# Patient Record
Sex: Female | Born: 1950 | Race: Black or African American | Hispanic: No | Smoking: Never smoker
Health system: Southern US, Community
[De-identification: ages and names within clinical notes are randomized; demographics above are authoritative.]

## PROBLEM LIST (undated history)

## (undated) ENCOUNTER — Ambulatory Visit (HOSPITAL_COMMUNITY): Admission: EM | Source: Home / Self Care

## (undated) DIAGNOSIS — M47812 Spondylosis without myelopathy or radiculopathy, cervical region: Secondary | ICD-10-CM

## (undated) DIAGNOSIS — Z8709 Personal history of other diseases of the respiratory system: Secondary | ICD-10-CM

## (undated) DIAGNOSIS — E785 Hyperlipidemia, unspecified: Secondary | ICD-10-CM

## (undated) DIAGNOSIS — F329 Major depressive disorder, single episode, unspecified: Secondary | ICD-10-CM

## (undated) DIAGNOSIS — J40 Bronchitis, not specified as acute or chronic: Secondary | ICD-10-CM

## (undated) DIAGNOSIS — Z87442 Personal history of urinary calculi: Secondary | ICD-10-CM

## (undated) DIAGNOSIS — K219 Gastro-esophageal reflux disease without esophagitis: Secondary | ICD-10-CM

## (undated) DIAGNOSIS — E7800 Pure hypercholesterolemia, unspecified: Secondary | ICD-10-CM

## (undated) DIAGNOSIS — Z8249 Family history of ischemic heart disease and other diseases of the circulatory system: Secondary | ICD-10-CM

## (undated) DIAGNOSIS — I4719 Other supraventricular tachycardia: Secondary | ICD-10-CM

## (undated) DIAGNOSIS — I209 Angina pectoris, unspecified: Secondary | ICD-10-CM

## (undated) DIAGNOSIS — I499 Cardiac arrhythmia, unspecified: Secondary | ICD-10-CM

## (undated) DIAGNOSIS — I639 Cerebral infarction, unspecified: Secondary | ICD-10-CM

## (undated) DIAGNOSIS — E119 Type 2 diabetes mellitus without complications: Secondary | ICD-10-CM

## (undated) DIAGNOSIS — J189 Pneumonia, unspecified organism: Secondary | ICD-10-CM

## (undated) DIAGNOSIS — R519 Headache, unspecified: Secondary | ICD-10-CM

## (undated) DIAGNOSIS — I471 Supraventricular tachycardia: Secondary | ICD-10-CM

## (undated) DIAGNOSIS — J45909 Unspecified asthma, uncomplicated: Secondary | ICD-10-CM

## (undated) DIAGNOSIS — G5603 Carpal tunnel syndrome, bilateral upper limbs: Secondary | ICD-10-CM

## (undated) DIAGNOSIS — I10 Essential (primary) hypertension: Secondary | ICD-10-CM

## (undated) DIAGNOSIS — R413 Other amnesia: Secondary | ICD-10-CM

## (undated) DIAGNOSIS — N6022 Fibroadenosis of left breast: Secondary | ICD-10-CM

## (undated) DIAGNOSIS — F3289 Other specified depressive episodes: Secondary | ICD-10-CM

## (undated) DIAGNOSIS — E559 Vitamin D deficiency, unspecified: Secondary | ICD-10-CM

## (undated) DIAGNOSIS — F419 Anxiety disorder, unspecified: Secondary | ICD-10-CM

## (undated) DIAGNOSIS — D573 Sickle-cell trait: Secondary | ICD-10-CM

## (undated) DIAGNOSIS — H269 Unspecified cataract: Secondary | ICD-10-CM

## (undated) HISTORY — DX: Other specified depressive episodes: F32.89

## (undated) HISTORY — DX: Pure hypercholesterolemia, unspecified: E78.00

## (undated) HISTORY — DX: Spondylosis without myelopathy or radiculopathy, cervical region: M47.812

## (undated) HISTORY — DX: Major depressive disorder, single episode, unspecified: F32.9

## (undated) HISTORY — PX: DILATION AND CURETTAGE OF UTERUS: SHX78

## (undated) HISTORY — DX: Vitamin D deficiency, unspecified: E55.9

## (undated) HISTORY — DX: Other amnesia: R41.3

## (undated) HISTORY — DX: Type 2 diabetes mellitus without complications: E11.9

---

## 1980-12-24 HISTORY — PX: TUBAL LIGATION: SHX77

## 1989-12-24 HISTORY — PX: ENDOMETRIAL ABLATION: SHX621

## 1999-11-23 ENCOUNTER — Other Ambulatory Visit: Admission: RE | Admit: 1999-11-23 | Discharge: 1999-11-23 | Payer: Self-pay | Admitting: Gynecology

## 2000-11-29 ENCOUNTER — Other Ambulatory Visit: Admission: RE | Admit: 2000-11-29 | Discharge: 2000-11-29 | Payer: Self-pay | Admitting: Gynecology

## 2001-01-24 ENCOUNTER — Ambulatory Visit (HOSPITAL_COMMUNITY): Admission: RE | Admit: 2001-01-24 | Discharge: 2001-01-24 | Payer: Self-pay | Admitting: Gynecology

## 2002-02-10 ENCOUNTER — Other Ambulatory Visit: Admission: RE | Admit: 2002-02-10 | Discharge: 2002-02-10 | Payer: Self-pay | Admitting: Gynecology

## 2002-02-25 ENCOUNTER — Encounter: Payer: Self-pay | Admitting: Emergency Medicine

## 2002-02-25 ENCOUNTER — Emergency Department (HOSPITAL_COMMUNITY): Admission: EM | Admit: 2002-02-25 | Discharge: 2002-02-25 | Payer: Self-pay | Admitting: Emergency Medicine

## 2003-04-05 ENCOUNTER — Other Ambulatory Visit: Admission: RE | Admit: 2003-04-05 | Discharge: 2003-04-05 | Payer: Self-pay | Admitting: Gynecology

## 2003-05-25 ENCOUNTER — Encounter: Admission: RE | Admit: 2003-05-25 | Discharge: 2003-08-23 | Payer: Self-pay | Admitting: Family Medicine

## 2003-08-31 ENCOUNTER — Encounter: Admission: RE | Admit: 2003-08-31 | Discharge: 2003-11-29 | Payer: Self-pay | Admitting: Internal Medicine

## 2003-12-30 ENCOUNTER — Encounter: Admission: RE | Admit: 2003-12-30 | Discharge: 2004-03-29 | Payer: Self-pay | Admitting: Internal Medicine

## 2004-03-18 ENCOUNTER — Emergency Department (HOSPITAL_COMMUNITY): Admission: EM | Admit: 2004-03-18 | Discharge: 2004-03-18 | Payer: Self-pay | Admitting: *Deleted

## 2004-03-18 IMAGING — CR DG CHEST 2V
2 series · 2 of 2 positions shown · non-contrast
Comparison: none

CLINICAL DATA: Chest pain.
 TWO-VIEW CHEST RADIOGRAPH, [DATE]
 Comparing to a report from a prior chest radiograph on [DATE].

[view not recorded (1 of 2)]
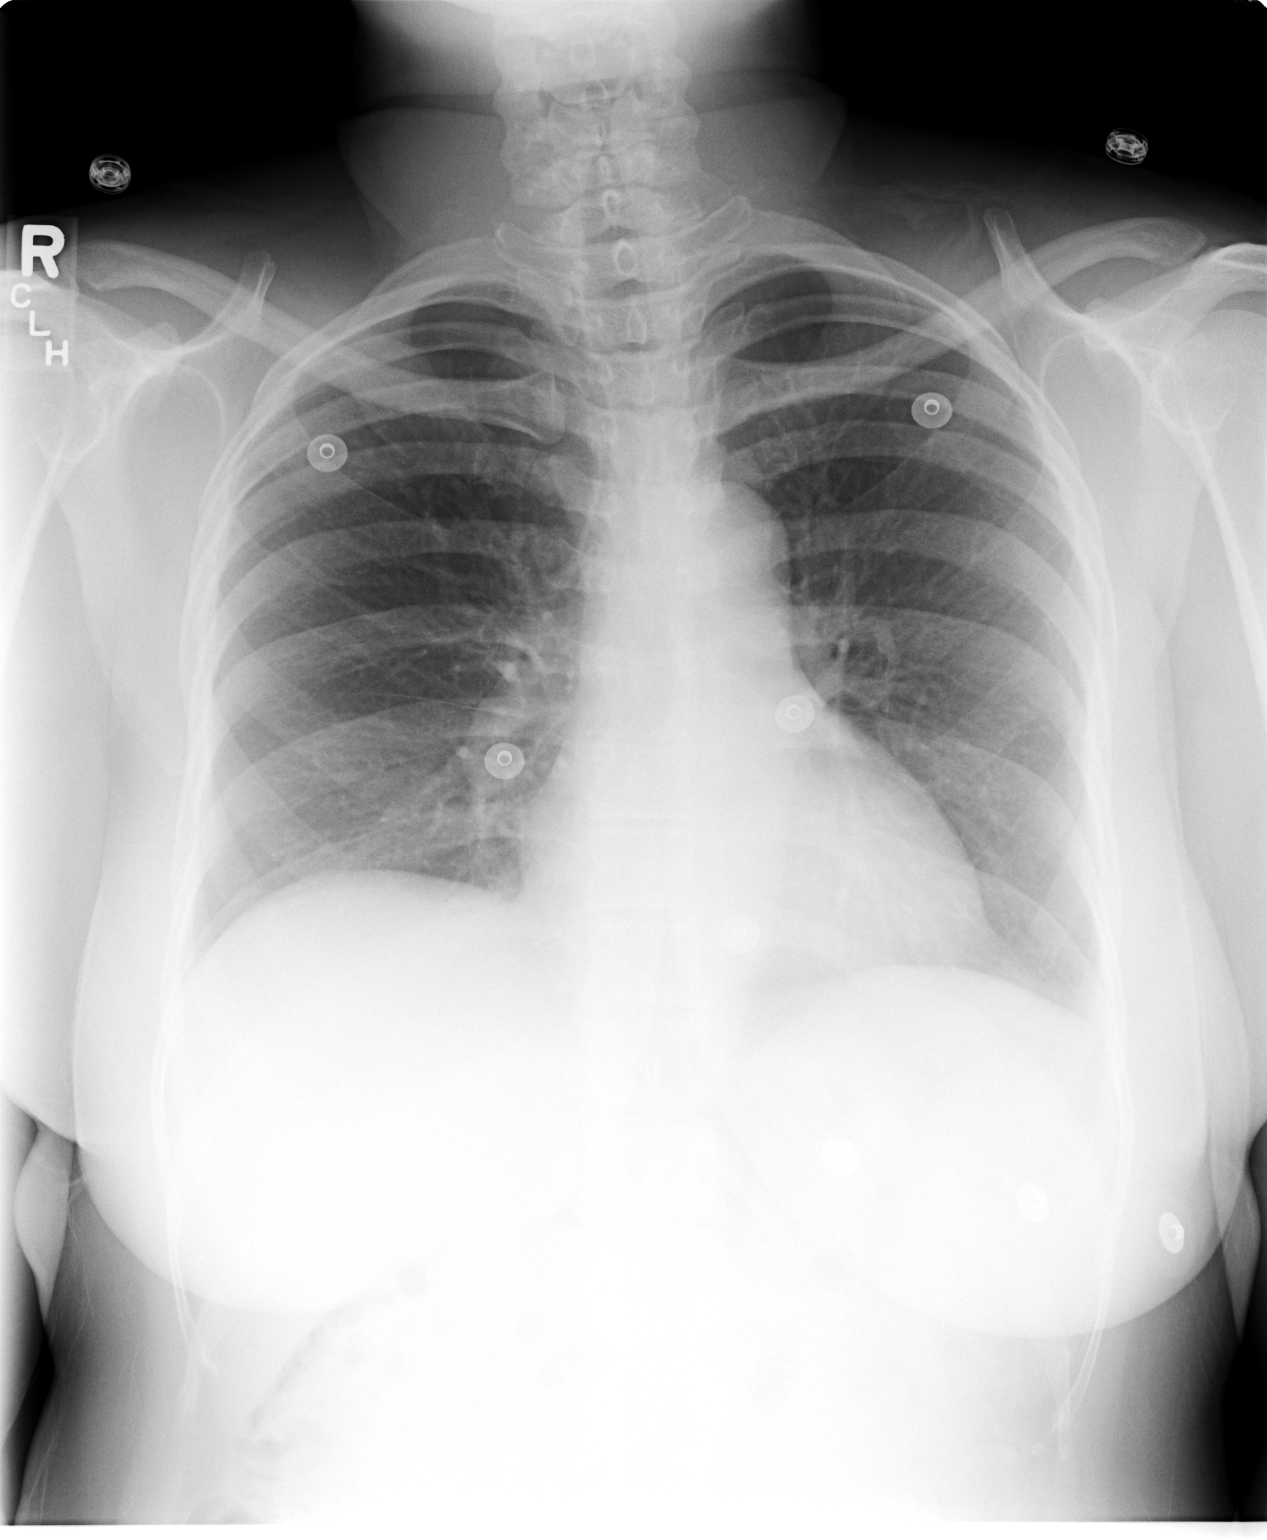

[view not recorded (2 of 2)]
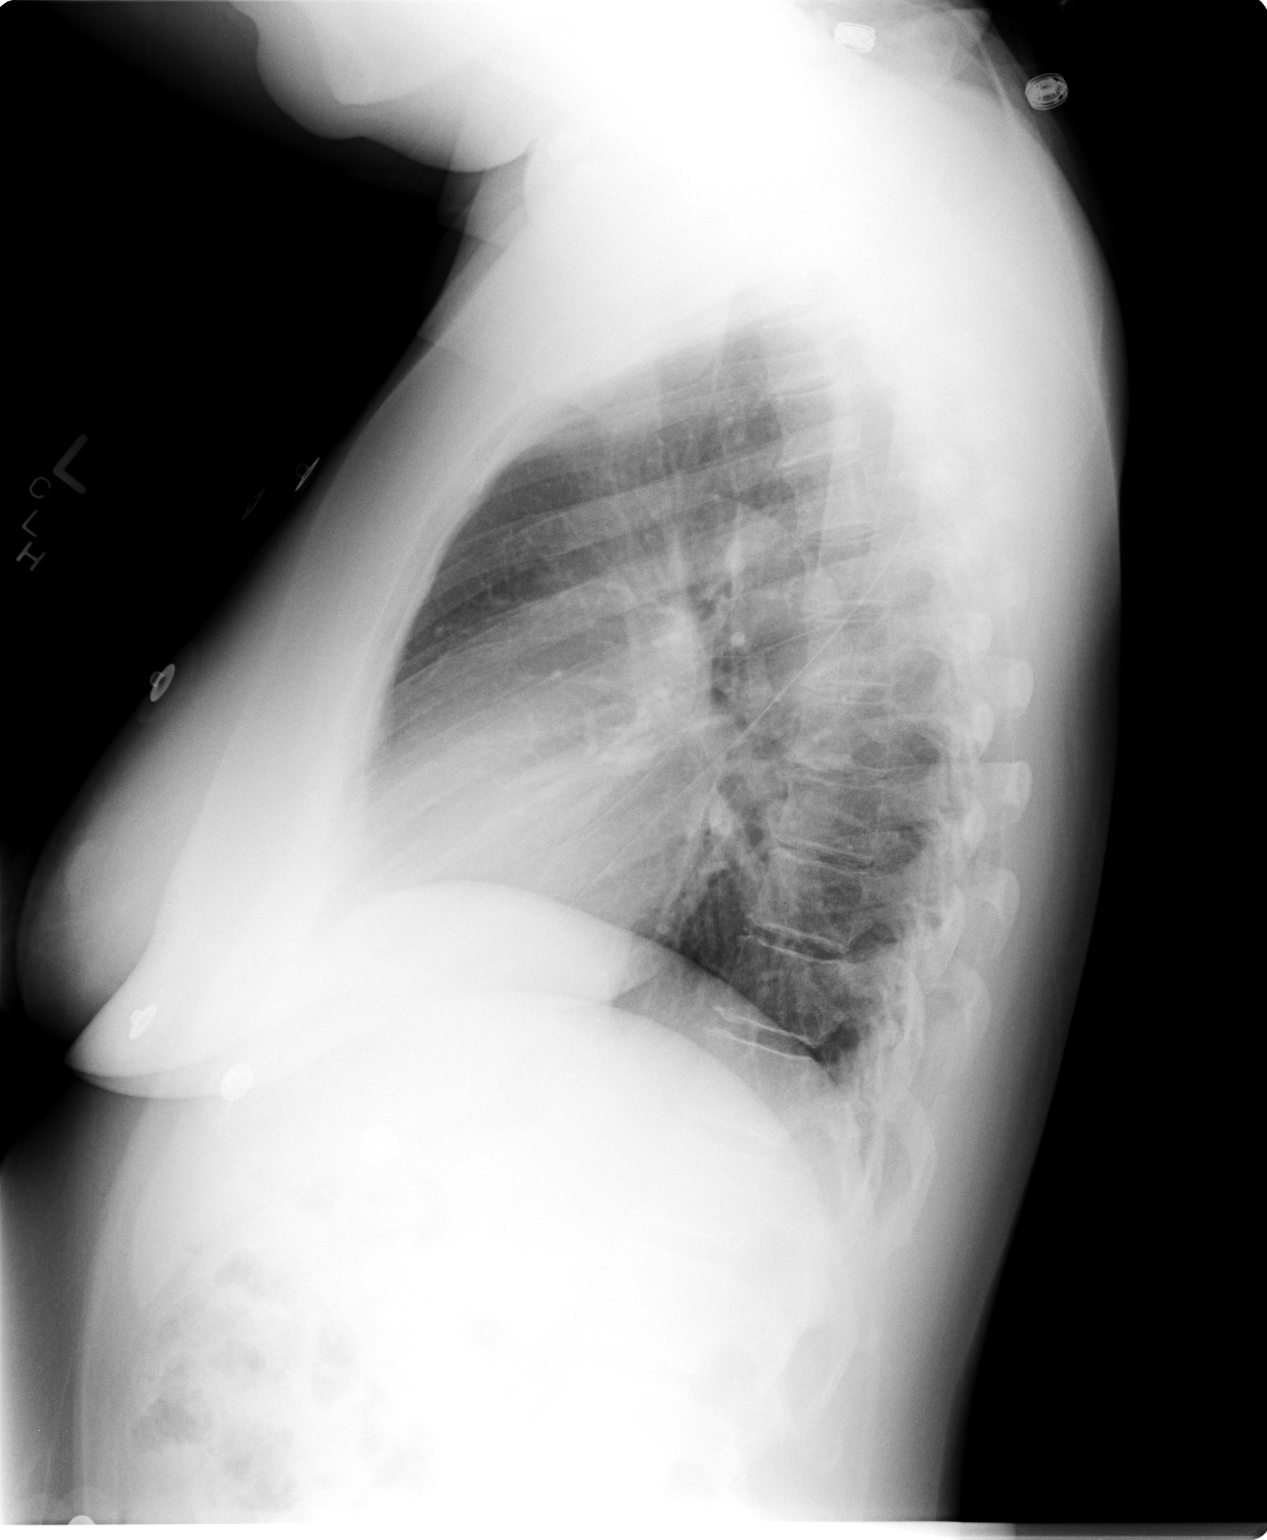

[2 of 2 positions shown; findings below may reference images not displayed]

FINDINGS: The heart size and mediastinal contours are unremarkable.  The lungs are clear.  The visualized skeleton is unremarkable.
 IMPRESSION
 No active disease.

## 2004-05-01 ENCOUNTER — Other Ambulatory Visit: Admission: RE | Admit: 2004-05-01 | Discharge: 2004-05-01 | Payer: Self-pay | Admitting: Gynecology

## 2004-07-04 ENCOUNTER — Emergency Department (HOSPITAL_COMMUNITY): Admission: EM | Admit: 2004-07-04 | Discharge: 2004-07-05 | Payer: Self-pay | Admitting: Emergency Medicine

## 2004-07-10 ENCOUNTER — Ambulatory Visit (HOSPITAL_COMMUNITY): Admission: RE | Admit: 2004-07-10 | Discharge: 2004-07-10 | Payer: Self-pay | Admitting: *Deleted

## 2005-05-18 ENCOUNTER — Other Ambulatory Visit: Admission: RE | Admit: 2005-05-18 | Discharge: 2005-05-18 | Payer: Self-pay | Admitting: Obstetrics and Gynecology

## 2007-12-25 HISTORY — PX: BREAST SURGERY: SHX581

## 2014-07-27 ENCOUNTER — Ambulatory Visit (INDEPENDENT_AMBULATORY_CARE_PROVIDER_SITE_OTHER): Payer: BC Managed Care – PPO | Admitting: Interventional Cardiology

## 2014-07-27 ENCOUNTER — Encounter: Payer: Self-pay | Admitting: Interventional Cardiology

## 2014-07-27 VITALS — BP 150/89 | HR 87 | Ht 59.0 in | Wt 167.0 lb

## 2014-07-27 DIAGNOSIS — Z8679 Personal history of other diseases of the circulatory system: Secondary | ICD-10-CM

## 2014-07-27 DIAGNOSIS — E118 Type 2 diabetes mellitus with unspecified complications: Secondary | ICD-10-CM

## 2014-07-27 DIAGNOSIS — E1165 Type 2 diabetes mellitus with hyperglycemia: Secondary | ICD-10-CM

## 2014-07-27 DIAGNOSIS — R9431 Abnormal electrocardiogram [ECG] [EKG]: Secondary | ICD-10-CM | POA: Insufficient documentation

## 2014-07-27 DIAGNOSIS — E785 Hyperlipidemia, unspecified: Secondary | ICD-10-CM | POA: Insufficient documentation

## 2014-07-27 DIAGNOSIS — I10 Essential (primary) hypertension: Secondary | ICD-10-CM | POA: Insufficient documentation

## 2014-07-27 DIAGNOSIS — IMO0002 Reserved for concepts with insufficient information to code with codable children: Secondary | ICD-10-CM

## 2014-07-27 NOTE — Patient Instructions (Signed)
Your physician recommends that you continue on your current medications as directed. Please refer to the Current Medication list given to you today.  Your physician has requested that you have en exercise stress myoview. For further information please visit [email or web address]. Please follow instruction sheet, as given.  Follow up pending results  

## 2014-07-27 NOTE — Progress Notes (Signed)
Patient ID: Jennifer [name], female   DOB: 1951-03-16, 63 y.o.   MRN: [id number]   Date: 07/27/2014 ID: Jennifer [name], DOB 1951-09-15, MRN [id number] PCP:  Duane Lope, MD  Reason: Referred because of family history of coronary disease/diabetes/an abnormal EKG  ASSESSMENT;  1. Abnormal EKG with precordial T-wave abnormality 2. Type 2 diabetes mellitus 3. Hypertension 4. Hyperlipidemia 5. Family history of CAD including 2 younger brothers with MI and sudden death  PLAN:  1. Stress Cardiolite   SUBJECTIVE: Jennifer [name] is a 63 y.o. female who is referred by her primary physician for cardiac evaluation. As a strong family history of coronary disease. 2 younger brothers have died of myocardial infarction. Both were diabetic. She has a 15+ year history of diabetes. 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 She has had no recurrence in over 30 years. She denies syncope. Is no orthopnea, PND, claudication, or exertional chest pain. She does experience an increase in heart rate and dyspnea with climbing stairs. This is been present for quite some time.   Allergies  Allergen Reactions  . Accupril [Quinapril Hcl] Cough  . Penicillins Rash    No current outpatient prescriptions on file prior to visit.   No current facility-administered medications on file prior to visit.    Past Medical History  Diagnosis Date  . Diabetes mellitus, type 2     Dr. Chestine Spore  . Hyperlipemia   . Essential hypertension, benign   . DJD (degenerative joint disease)     Dr. Charlett Blake  . Osteoarthritis of both knees   . Mixed hyperlipidemia   . Type II or unspecified type diabetes mellitus without mention of complication, not stated as uncontrolled   . Type II or unspecified type diabetes mellitus without mention of complication, uncontrolled   . Depressive disorder, not elsewhere classified   . Allergic rhinitis due to pollen   .  Allergic rhinitis, cause unspecified   . Esophageal reflux   . Sleep disturbance, unspecified   . Degenerative arthritis of lumbar spine   . Degenerative arthritis of cervical spine   . Osteoarthritis of hand   . Osteoarthritis of both knees     No past surgical history on file.  History   Social History  . Marital Status: Married    Spouse Name: N/A    Number of Children: N/A  . Years of Education: N/A   Occupational History  . Not on file.   Social History Main Topics  . Smoking status: Never Smoker   . Smokeless tobacco: Not on file  . Alcohol Use: Not on file  . Drug Use: No  . Sexual Activity: Not on file   Other Topics Concern  . Not on file   Social History Narrative  . No narrative on file    Family History  Problem Relation Age of Onset  . Diabetes Mother     DM  . Hypertension Father   . Diabetes Father     DM  . Heart attack Brother   . CAD Brother   . Heart disease Paternal Grandfather   . Diabetes Brother     DM  . Heart attack Brother   . CAD Brother   . Lupus Other     Family H/O of Lupus    ROS: Occasional lower extremity edema. Denies orthopnea. No transient neurological events. There is no wheezing, cough, She denies abdominal distention.. Other  systems negative for complaints.  OBJECTIVE: BP 150/89  Pulse 87  Ht 4\' 11"  (1.499 m)  Wt 167 lb (75.751 kg)  BMI 33.71 kg/m2,  General: No acute distress, mildly obese HEENT: normal no jaundice or pallor Neck: JVD flat. Carotid absent for bruits Cardiac: Murmur:  none. Gallop:  S4 gallop. Rhythm:  normal. Other:  normal Abdomen: Bruit:  absent. Pulsation:  absent Extremities: Edema:  absent. Pulses:  bounding and 2+ bilateral Neuro:  normal Psych:  normal  ECG:  abnormal with normal sinus rhythm and precordial T-wave abnormality V2 through V4 suggesting the possibility of ischemia versus nonspecific change

## 2014-09-27 ENCOUNTER — Encounter (HOSPITAL_COMMUNITY): Payer: BC Managed Care – PPO

## 2014-11-04 ENCOUNTER — Other Ambulatory Visit: Payer: Self-pay | Admitting: Gynecology

## 2014-11-04 DIAGNOSIS — R928 Other abnormal and inconclusive findings on diagnostic imaging of breast: Secondary | ICD-10-CM

## 2014-11-17 ENCOUNTER — Ambulatory Visit
Admission: RE | Admit: 2014-11-17 | Discharge: 2014-11-17 | Disposition: A | Discharge status: Home / Self Care | Payer: BC Managed Care – PPO | Source: Ambulatory Visit | Attending: Gynecology | Admitting: Gynecology

## 2014-11-17 DIAGNOSIS — R928 Other abnormal and inconclusive findings on diagnostic imaging of breast: Secondary | ICD-10-CM

## 2015-01-07 ENCOUNTER — Other Ambulatory Visit: Payer: Self-pay | Admitting: Otolaryngology

## 2015-01-10 ENCOUNTER — Other Ambulatory Visit: Payer: Self-pay | Admitting: Otolaryngology

## 2015-01-10 DIAGNOSIS — M542 Cervicalgia: Secondary | ICD-10-CM

## 2015-01-10 DIAGNOSIS — H9201 Otalgia, right ear: Secondary | ICD-10-CM

## 2015-01-17 ENCOUNTER — Other Ambulatory Visit (HOSPITAL_COMMUNITY): Payer: Self-pay | Admitting: Diagnostic Radiology

## 2015-01-17 ENCOUNTER — Other Ambulatory Visit: Payer: Self-pay

## 2015-01-17 LAB — BUN: BUN: 8 mg/dL (ref 6–23)

## 2015-01-17 LAB — CREATININE, SERUM: Creat: 0.8 mg/dL (ref 0.50–1.10)

## 2015-01-24 ENCOUNTER — Ambulatory Visit
Admission: RE | Admit: 2015-01-24 | Discharge: 2015-01-24 | Disposition: A | Discharge status: Home / Self Care | Payer: Self-pay | Source: Ambulatory Visit | Attending: Otolaryngology | Admitting: Otolaryngology

## 2015-01-24 DIAGNOSIS — M542 Cervicalgia: Secondary | ICD-10-CM

## 2015-01-24 DIAGNOSIS — H9201 Otalgia, right ear: Secondary | ICD-10-CM

## 2015-01-24 IMAGING — CT CT NECK W/ CM
3 of 12 series · 10 of 33 positions shown, 12 images · IV contrast (75CC OMNI 300)
Comparison: CT temporal bone from today

CLINICAL DATA: Otalgia right ear

EXAM:
CT NECK WITH CONTRAST
TECHNIQUE: Multidetector CT imaging of the neck was performed using the
standard protocol following the bolus administration of intravenous
contrast.
CONTRAST:  75 mL Omnipaque 300 IV

[Series 3: ax mag right · axial · 0.20mm/px · z∈[+102,+183]mm · 2 of 261 slices shown, 3 images]
[im 1/261  soft-tissue]
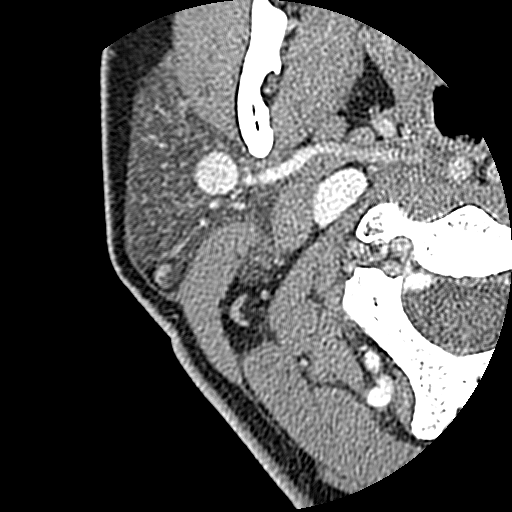
[im 1/261  bone]
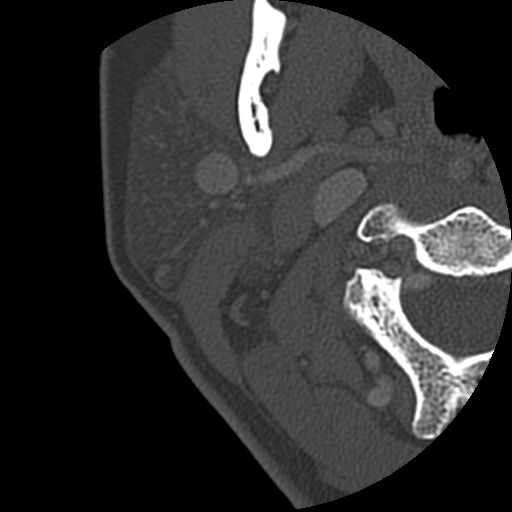
[im 261/261  bone]
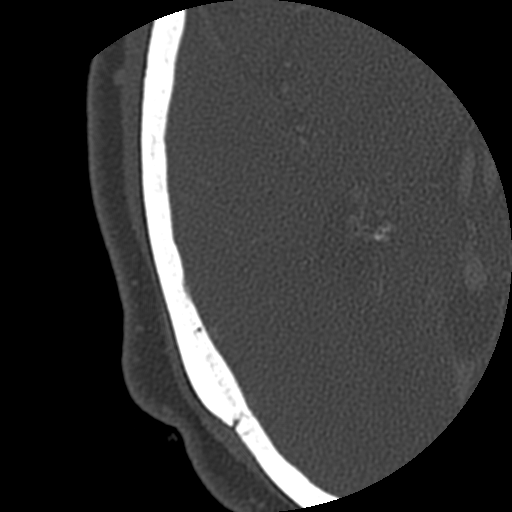

[Series 600: cor · coronal · 0.41mm/px · 3 of 92 slices shown]
[im 19/92  bone]
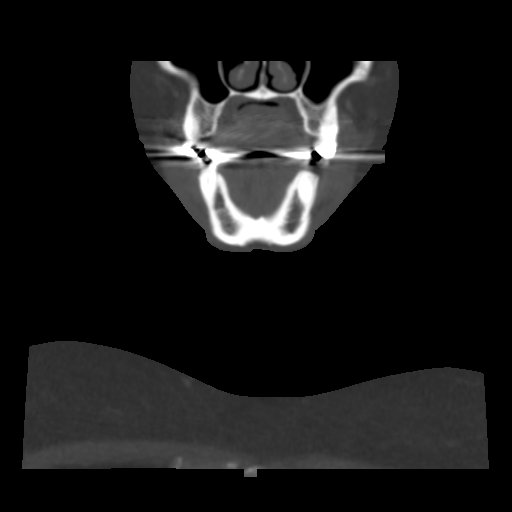
[im 37/92  bone]
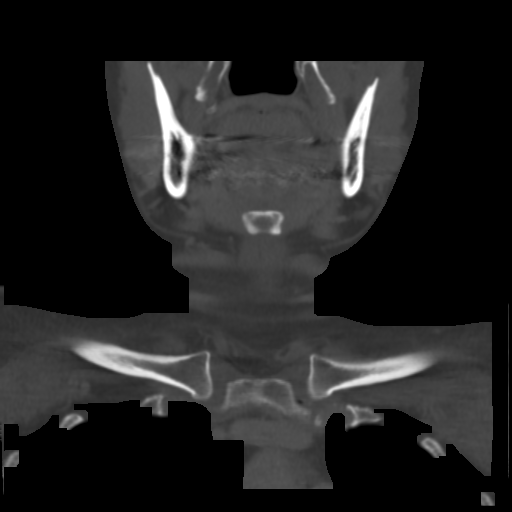
[im 55/92  bone]
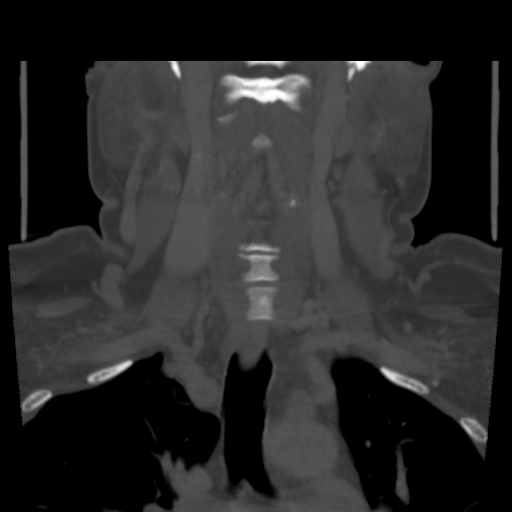

[Series 603: sag · sagittal · 0.41mm/px · 5 of 80 slices shown, 6 images]
[im 27/80  bone]
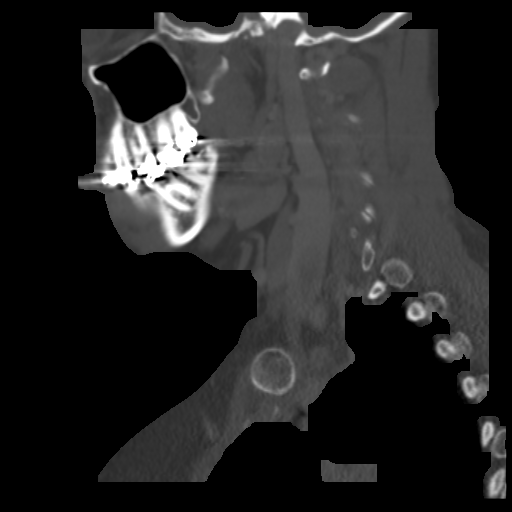
[im 33/80  bone]
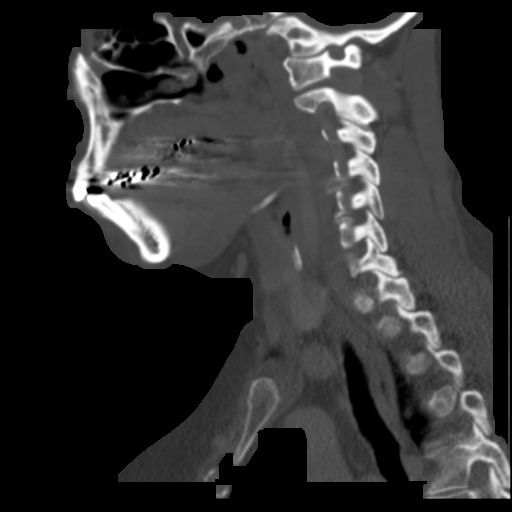
[im 40/80  soft-tissue]
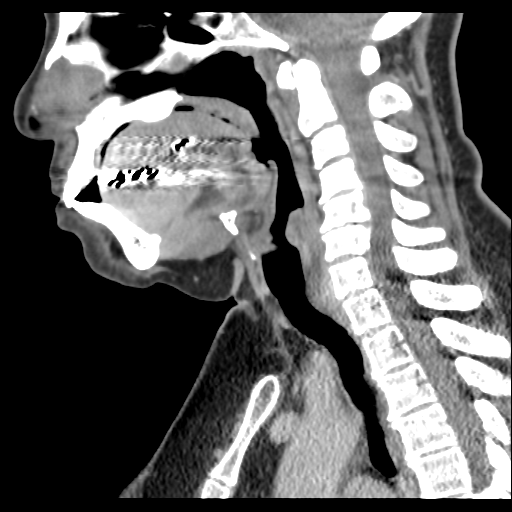
[im 40/80  bone]
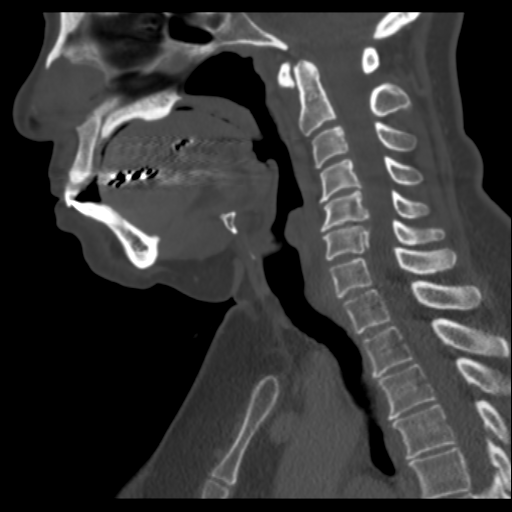
[im 47/80  bone]
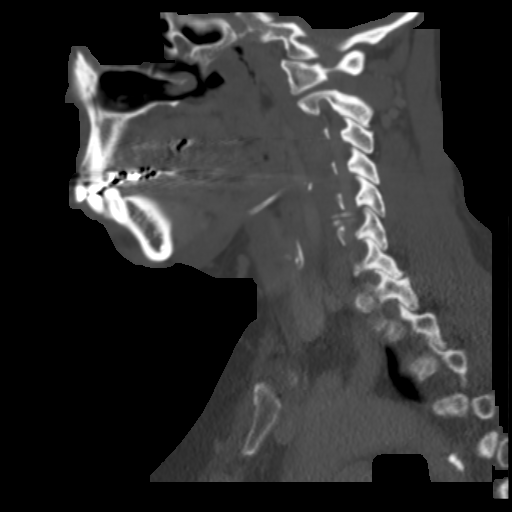
[im 53/80  bone]
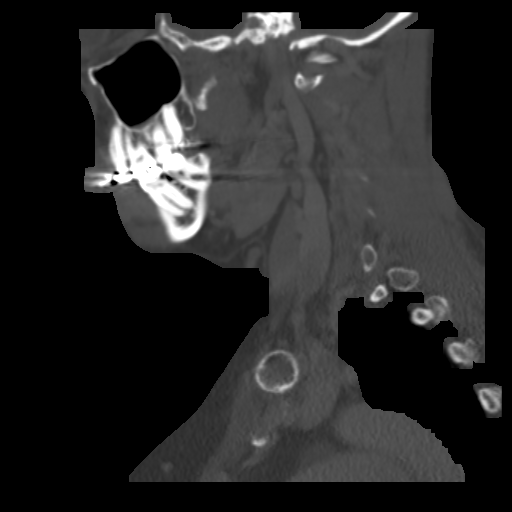

[10 of 33 positions shown; findings below may reference images not displayed]

FINDINGS: Pharynx and larynx: Negative

Salivary glands: Negative

Thyroid: Negative

Lymph nodes: Negative for adenopathy in the neck.

Vascular: Kissing carotids with medial deviation of the internal
carotid artery in the retropharyngeal tissues, a normal variant.
Both carotid arteries are patent. The jugular vein is patent
bilaterally.

Limited intracranial: Negative

Visualized orbits: Negative

Mastoids and visualized paranasal sinuses: Negative

Skeleton: Cervical spondylosis with kyphosis.  No acute bony lesion.

Upper chest: Lung apices clear.

Regional soft tissues around the external canal are normal
bilaterally. No cystic or solid mass or adenopathy.
IMPRESSION: Negative

## 2015-01-24 MED ORDER — IOHEXOL 350 MG/ML SOLN
75.0000 mL | Freq: Once | INTRAVENOUS | Status: AC | PRN
  Administered 2015-01-24: 75 mL via INTRAVENOUS

## 2017-03-01 DIAGNOSIS — E1165 Type 2 diabetes mellitus with hyperglycemia: Secondary | ICD-10-CM | POA: Diagnosis not present

## 2017-03-01 DIAGNOSIS — R9431 Abnormal electrocardiogram [ECG] [EKG]: Secondary | ICD-10-CM | POA: Diagnosis not present

## 2017-03-01 DIAGNOSIS — I10 Essential (primary) hypertension: Secondary | ICD-10-CM | POA: Diagnosis not present

## 2017-03-01 DIAGNOSIS — Z8249 Family history of ischemic heart disease and other diseases of the circulatory system: Secondary | ICD-10-CM | POA: Diagnosis not present

## 2017-03-15 DIAGNOSIS — I10 Essential (primary) hypertension: Secondary | ICD-10-CM | POA: Diagnosis not present

## 2017-03-15 DIAGNOSIS — Z8249 Family history of ischemic heart disease and other diseases of the circulatory system: Secondary | ICD-10-CM | POA: Diagnosis not present

## 2017-03-29 DIAGNOSIS — Z8249 Family history of ischemic heart disease and other diseases of the circulatory system: Secondary | ICD-10-CM | POA: Diagnosis not present

## 2017-03-29 DIAGNOSIS — I10 Essential (primary) hypertension: Secondary | ICD-10-CM | POA: Diagnosis not present

## 2017-03-29 DIAGNOSIS — R9431 Abnormal electrocardiogram [ECG] [EKG]: Secondary | ICD-10-CM | POA: Diagnosis not present

## 2017-03-29 DIAGNOSIS — E1165 Type 2 diabetes mellitus with hyperglycemia: Secondary | ICD-10-CM | POA: Diagnosis not present

## 2017-06-21 DIAGNOSIS — M778 Other enthesopathies, not elsewhere classified: Secondary | ICD-10-CM | POA: Diagnosis not present

## 2017-07-01 DIAGNOSIS — M7702 Medial epicondylitis, left elbow: Secondary | ICD-10-CM | POA: Diagnosis not present

## 2017-07-03 DIAGNOSIS — M70832 Other soft tissue disorders related to use, overuse and pressure, left forearm: Secondary | ICD-10-CM | POA: Diagnosis not present

## 2017-07-03 DIAGNOSIS — M79632 Pain in left forearm: Secondary | ICD-10-CM | POA: Diagnosis not present

## 2017-07-03 DIAGNOSIS — M7702 Medial epicondylitis, left elbow: Secondary | ICD-10-CM | POA: Diagnosis not present

## 2017-07-09 DIAGNOSIS — M79632 Pain in left forearm: Secondary | ICD-10-CM | POA: Diagnosis not present

## 2017-07-09 DIAGNOSIS — M70832 Other soft tissue disorders related to use, overuse and pressure, left forearm: Secondary | ICD-10-CM | POA: Diagnosis not present

## 2017-07-09 DIAGNOSIS — M7702 Medial epicondylitis, left elbow: Secondary | ICD-10-CM | POA: Diagnosis not present

## 2017-07-16 DIAGNOSIS — M70832 Other soft tissue disorders related to use, overuse and pressure, left forearm: Secondary | ICD-10-CM | POA: Diagnosis not present

## 2017-07-16 DIAGNOSIS — M7702 Medial epicondylitis, left elbow: Secondary | ICD-10-CM | POA: Diagnosis not present

## 2017-07-16 DIAGNOSIS — M79632 Pain in left forearm: Secondary | ICD-10-CM | POA: Diagnosis not present

## 2017-07-19 DIAGNOSIS — M79632 Pain in left forearm: Secondary | ICD-10-CM | POA: Diagnosis not present

## 2017-07-19 DIAGNOSIS — M70832 Other soft tissue disorders related to use, overuse and pressure, left forearm: Secondary | ICD-10-CM | POA: Diagnosis not present

## 2017-07-19 DIAGNOSIS — M7702 Medial epicondylitis, left elbow: Secondary | ICD-10-CM | POA: Diagnosis not present

## 2017-07-23 DIAGNOSIS — M7702 Medial epicondylitis, left elbow: Secondary | ICD-10-CM | POA: Diagnosis not present

## 2017-07-23 DIAGNOSIS — M79632 Pain in left forearm: Secondary | ICD-10-CM | POA: Diagnosis not present

## 2017-07-23 DIAGNOSIS — M70832 Other soft tissue disorders related to use, overuse and pressure, left forearm: Secondary | ICD-10-CM | POA: Diagnosis not present

## 2017-07-24 DIAGNOSIS — M70832 Other soft tissue disorders related to use, overuse and pressure, left forearm: Secondary | ICD-10-CM | POA: Diagnosis not present

## 2017-08-14 DIAGNOSIS — K219 Gastro-esophageal reflux disease without esophagitis: Secondary | ICD-10-CM | POA: Diagnosis not present

## 2017-08-28 DIAGNOSIS — Z23 Encounter for immunization: Secondary | ICD-10-CM | POA: Diagnosis not present

## 2017-08-28 DIAGNOSIS — M189 Osteoarthritis of first carpometacarpal joint, unspecified: Secondary | ICD-10-CM | POA: Diagnosis not present

## 2017-08-28 DIAGNOSIS — K219 Gastro-esophageal reflux disease without esophagitis: Secondary | ICD-10-CM | POA: Diagnosis not present

## 2017-08-28 DIAGNOSIS — M778 Other enthesopathies, not elsewhere classified: Secondary | ICD-10-CM | POA: Diagnosis not present

## 2017-10-10 DIAGNOSIS — I10 Essential (primary) hypertension: Secondary | ICD-10-CM | POA: Diagnosis not present

## 2017-10-10 DIAGNOSIS — E1165 Type 2 diabetes mellitus with hyperglycemia: Secondary | ICD-10-CM | POA: Diagnosis not present

## 2017-10-10 DIAGNOSIS — E7800 Pure hypercholesterolemia, unspecified: Secondary | ICD-10-CM | POA: Diagnosis not present

## 2017-10-10 DIAGNOSIS — M150 Primary generalized (osteo)arthritis: Secondary | ICD-10-CM | POA: Diagnosis not present

## 2017-10-15 DIAGNOSIS — F4323 Adjustment disorder with mixed anxiety and depressed mood: Secondary | ICD-10-CM | POA: Diagnosis not present

## 2017-11-19 DIAGNOSIS — E559 Vitamin D deficiency, unspecified: Secondary | ICD-10-CM | POA: Diagnosis not present

## 2017-11-19 DIAGNOSIS — E1165 Type 2 diabetes mellitus with hyperglycemia: Secondary | ICD-10-CM | POA: Diagnosis not present

## 2017-11-19 DIAGNOSIS — I10 Essential (primary) hypertension: Secondary | ICD-10-CM | POA: Diagnosis not present

## 2017-11-19 DIAGNOSIS — M150 Primary generalized (osteo)arthritis: Secondary | ICD-10-CM | POA: Diagnosis not present

## 2017-11-19 DIAGNOSIS — E7800 Pure hypercholesterolemia, unspecified: Secondary | ICD-10-CM | POA: Diagnosis not present

## 2017-11-19 DIAGNOSIS — M2550 Pain in unspecified joint: Secondary | ICD-10-CM | POA: Diagnosis not present

## 2017-11-19 DIAGNOSIS — E119 Type 2 diabetes mellitus without complications: Secondary | ICD-10-CM | POA: Diagnosis not present

## 2017-12-05 DIAGNOSIS — Z1231 Encounter for screening mammogram for malignant neoplasm of breast: Secondary | ICD-10-CM | POA: Diagnosis not present

## 2017-12-05 DIAGNOSIS — Z01419 Encounter for gynecological examination (general) (routine) without abnormal findings: Secondary | ICD-10-CM | POA: Diagnosis not present

## 2017-12-05 DIAGNOSIS — Z6831 Body mass index (BMI) 31.0-31.9, adult: Secondary | ICD-10-CM | POA: Diagnosis not present

## 2017-12-05 DIAGNOSIS — Z124 Encounter for screening for malignant neoplasm of cervix: Secondary | ICD-10-CM | POA: Diagnosis not present

## 2017-12-10 ENCOUNTER — Other Ambulatory Visit: Payer: Self-pay | Admitting: Obstetrics & Gynecology

## 2017-12-10 DIAGNOSIS — R928 Other abnormal and inconclusive findings on diagnostic imaging of breast: Secondary | ICD-10-CM

## 2017-12-23 ENCOUNTER — Other Ambulatory Visit: Payer: Self-pay | Admitting: Obstetrics & Gynecology

## 2017-12-23 ENCOUNTER — Ambulatory Visit
Admission: RE | Admit: 2017-12-23 | Discharge: 2017-12-23 | Disposition: A | Discharge status: Home / Self Care | Payer: BLUE CROSS/BLUE SHIELD | Source: Ambulatory Visit | Attending: Obstetrics & Gynecology | Admitting: Obstetrics & Gynecology

## 2017-12-23 DIAGNOSIS — N6489 Other specified disorders of breast: Secondary | ICD-10-CM | POA: Diagnosis not present

## 2017-12-23 DIAGNOSIS — R921 Mammographic calcification found on diagnostic imaging of breast: Secondary | ICD-10-CM

## 2017-12-23 DIAGNOSIS — R928 Other abnormal and inconclusive findings on diagnostic imaging of breast: Secondary | ICD-10-CM

## 2017-12-23 DIAGNOSIS — R922 Inconclusive mammogram: Secondary | ICD-10-CM | POA: Diagnosis not present

## 2017-12-23 IMAGING — US ULTRASOUND LEFT BREAST LIMITED
1 series · 2 of 2 positions shown · non-contrast
Comparison: [DATE] and earlier

CLINICAL DATA: Patient returns after screening study for evaluation
of possible left breast mass and calcifications.

EXAM:
2D DIGITAL DIAGNOSTIC LEFT MAMMOGRAM WITH CAD AND ADJUNCT TOMO
ULTRASOUND LEFT BREAST

[Series 1: ultrasound left breast limited · 0.07mm/px · 2 of 2 slices shown]
[im 1/2]
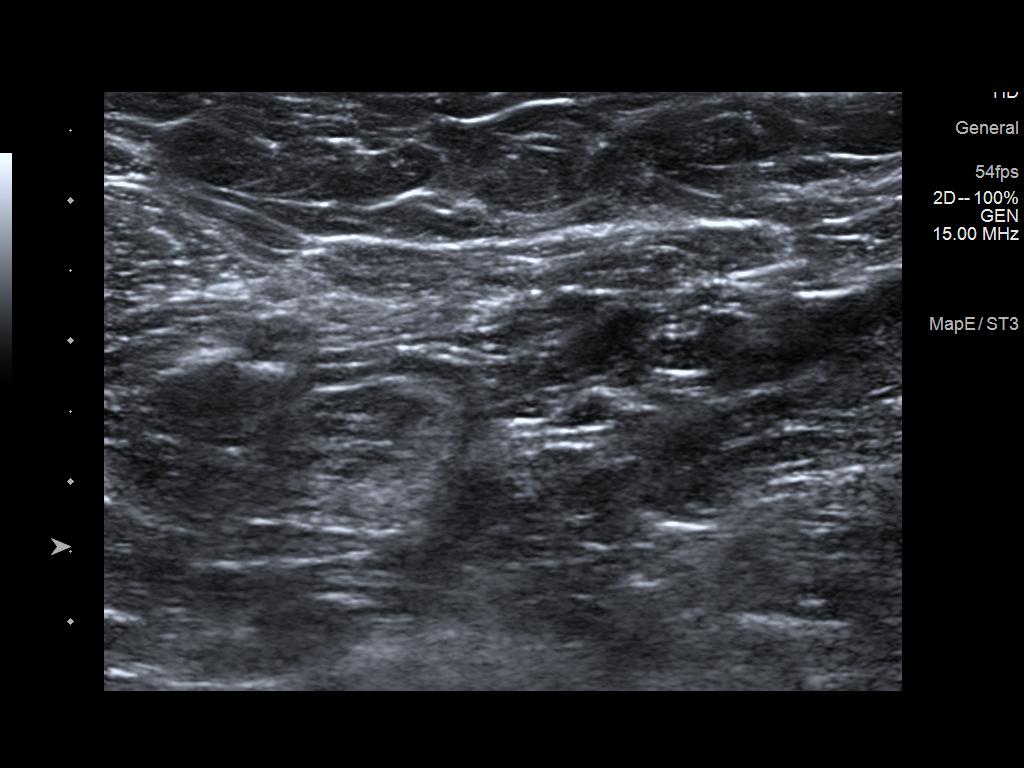
[im 2/2]
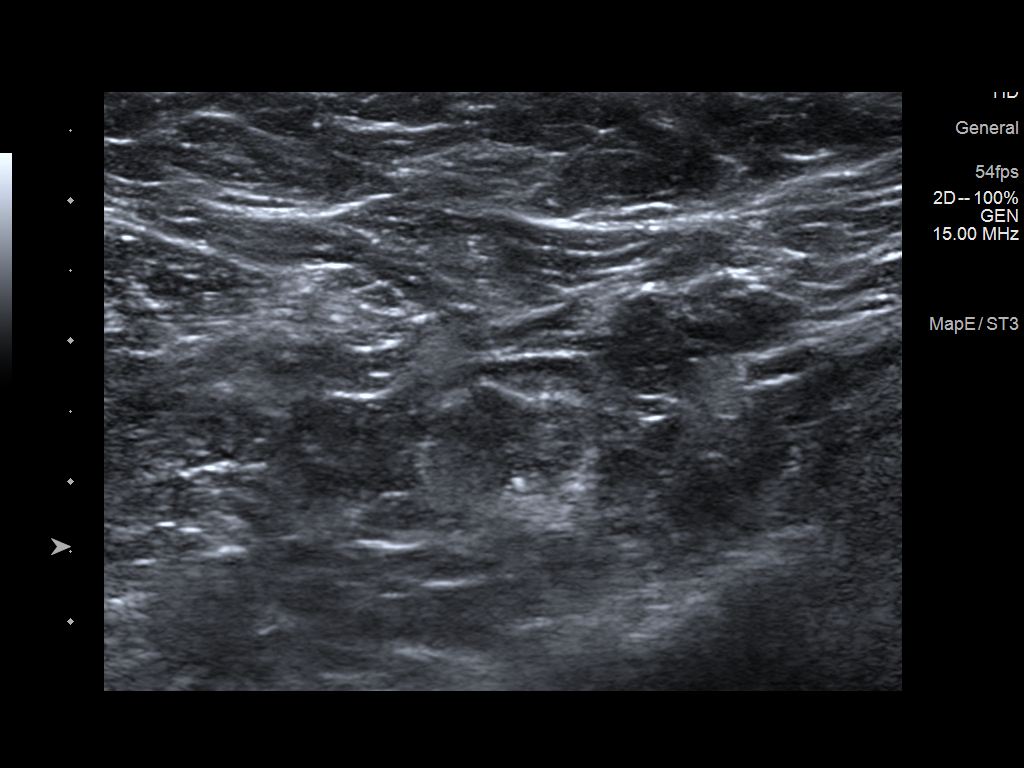

[2 of 2 positions shown; findings below may reference images not displayed]

ACR Breast Density Category c: The breast tissue is heterogeneously
dense, which may obscure small masses.
FINDINGS: Additional 2-D and 3-D images are performed. These views confirm
presence of subtle distortion in the upper inner quadrant of the
left breast, associated with scattered faint linear calcifications
spanning 3.6 x 3.9 x 0.5 cm in the upper inner quadrant.

Mammographic images were processed with CAD.

On physical exam, I palpate no abnormality in the upper inner
quadrant of the left breast.

Targeted ultrasound is performed, showing normal appearing
fibroglandular tissue in the upper inner quadrant of the left
breast. No mass or distortion identified. Evaluation of the left
axilla is negative for adenopathy.
IMPRESSION: Persistent subtle distortion and calcifications in the upper inner
quadrant of the left breast. Biopsy is indicated.

No adenopathy in the left axilla.

RECOMMENDATION:
Recommend stereotactic guided core biopsy the anterior aspect of the
calcifications, in the area of subtle distortion. If the biopsy is
positive for malignancy or atypia, bracketing of the entire area of
calcifications would be indicated.

I have discussed the findings and recommendations with the patient.
Results were also provided in writing at the conclusion of the
visit. If applicable, a reminder letter will be sent to the patient
regarding the next appointment.

BI-RADS CATEGORY  4: Suspicious.

## 2017-12-23 IMAGING — MG 2D DIGITAL DIAGNOSTIC UNILATERAL LEFT MAMMOGRAM WITH CAD AND ADJ
8 of 10 series · 8 of 22 positions shown · non-contrast
Comparison: [DATE] and earlier

CLINICAL DATA: Patient returns after screening study for evaluation
of possible left breast mass and calcifications.

EXAM:
2D DIGITAL DIAGNOSTIC LEFT MAMMOGRAM WITH CAD AND ADJUNCT TOMO
ULTRASOUND LEFT BREAST

[L ML (1 of 3)]
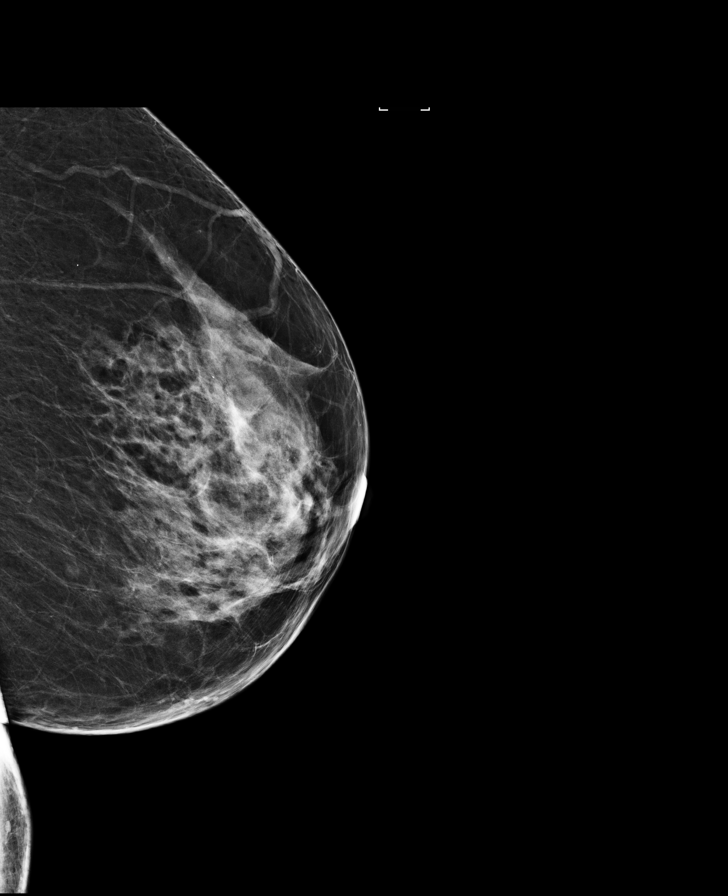

[L ML (2 of 3)]
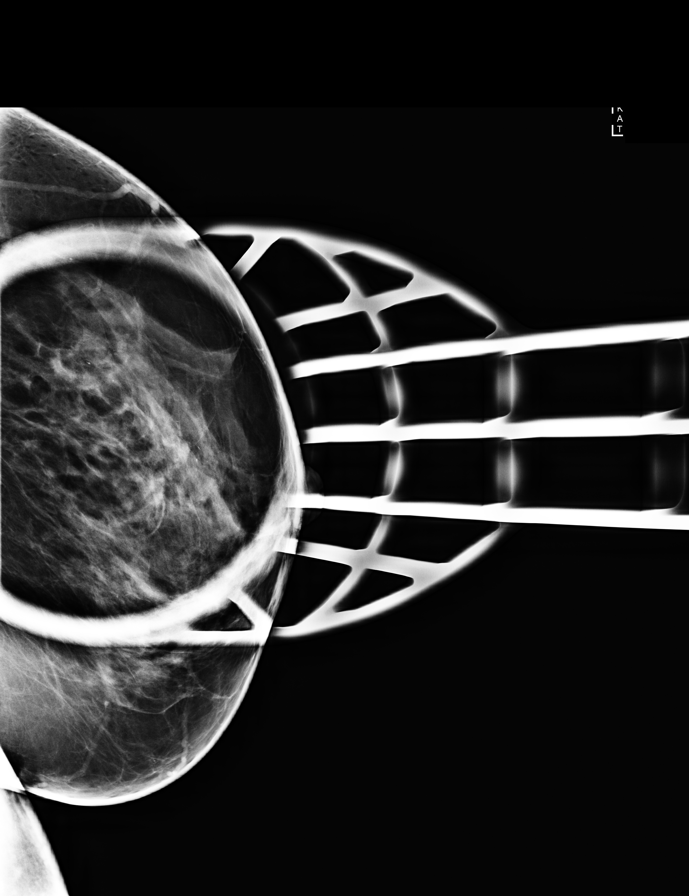

[L ML (3 of 3)]
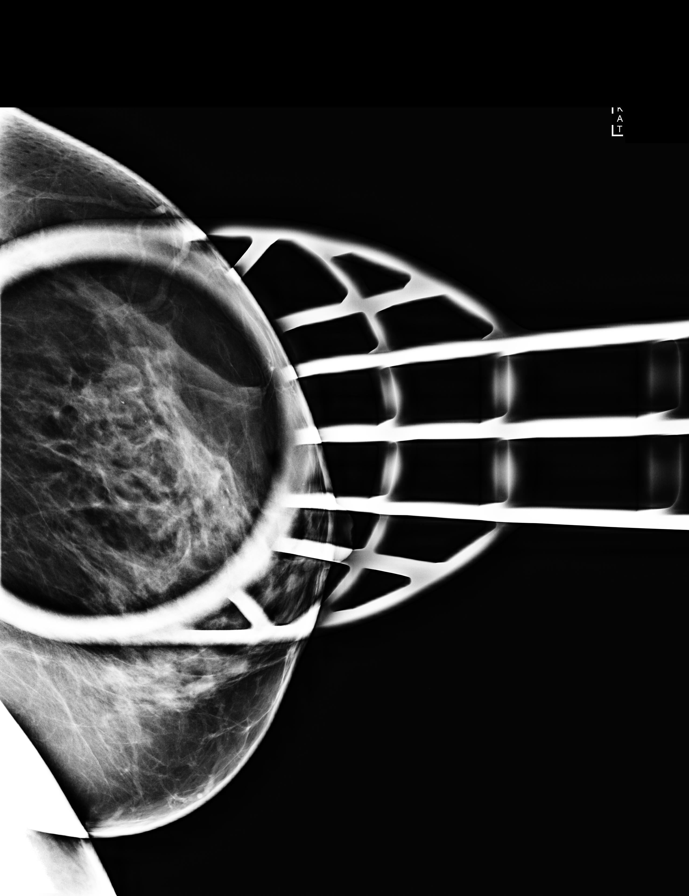

[L CC synth-2D]
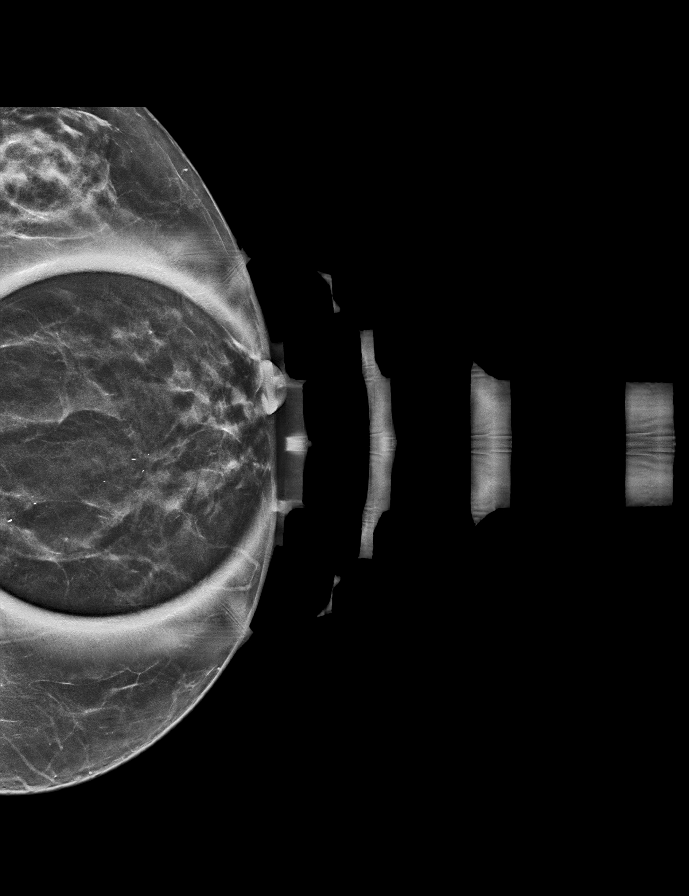

[L ML synth-2D (1 of 2)]
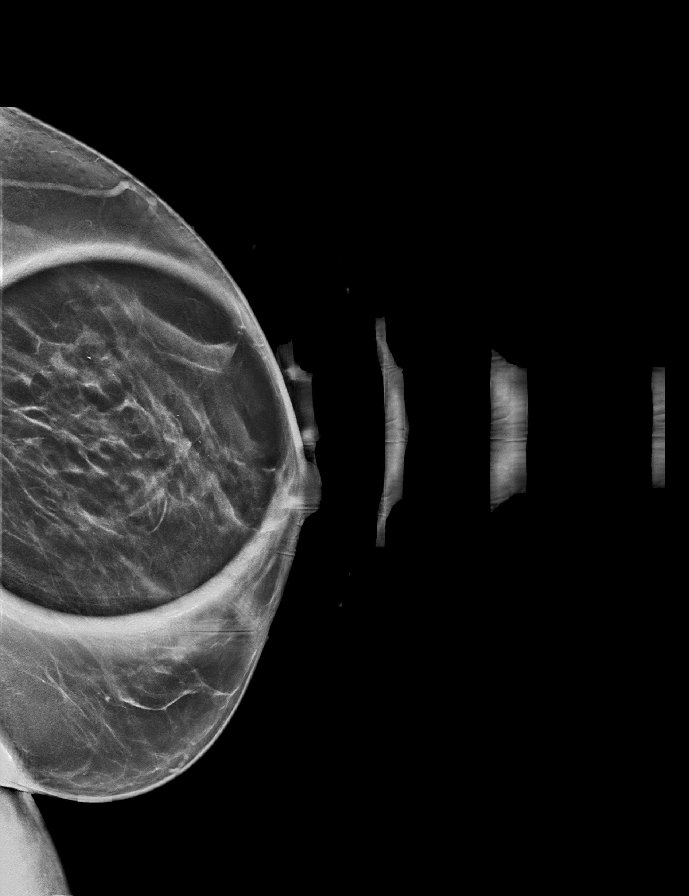

[L CC]
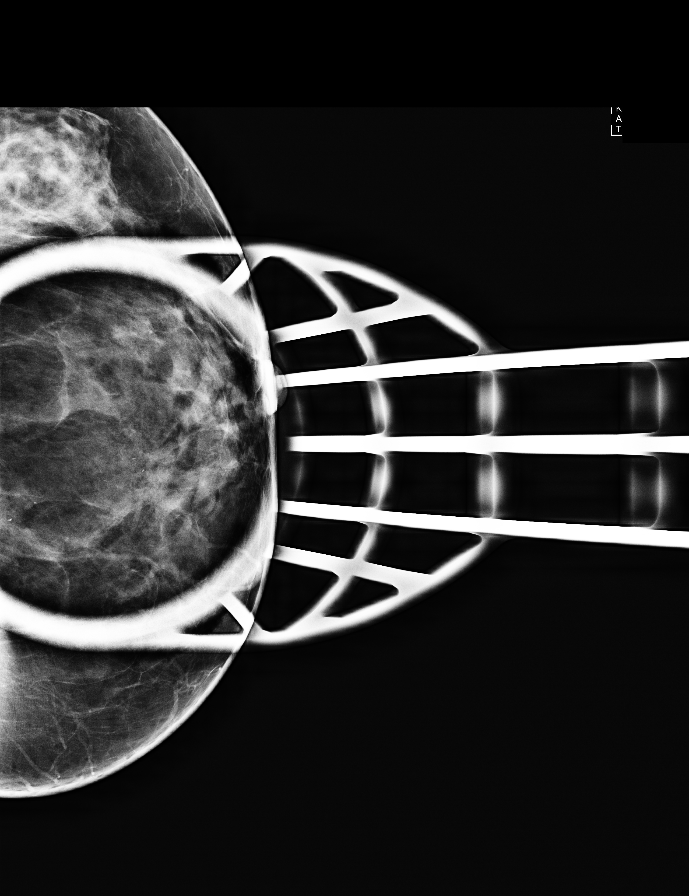

[L ML synth-2D (2 of 2)]
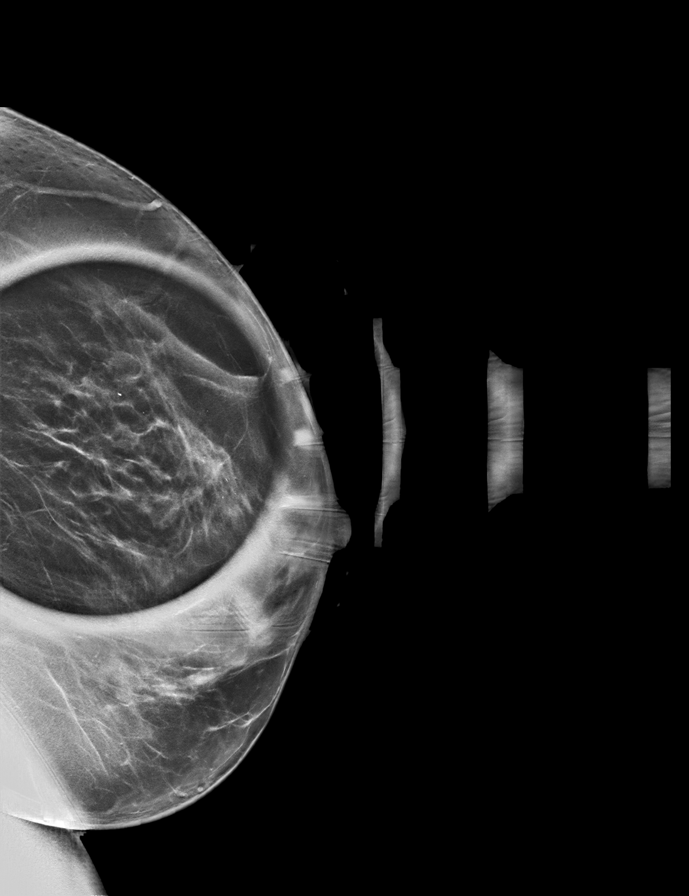

[L CC tomo · tomo slice 25/48.0]
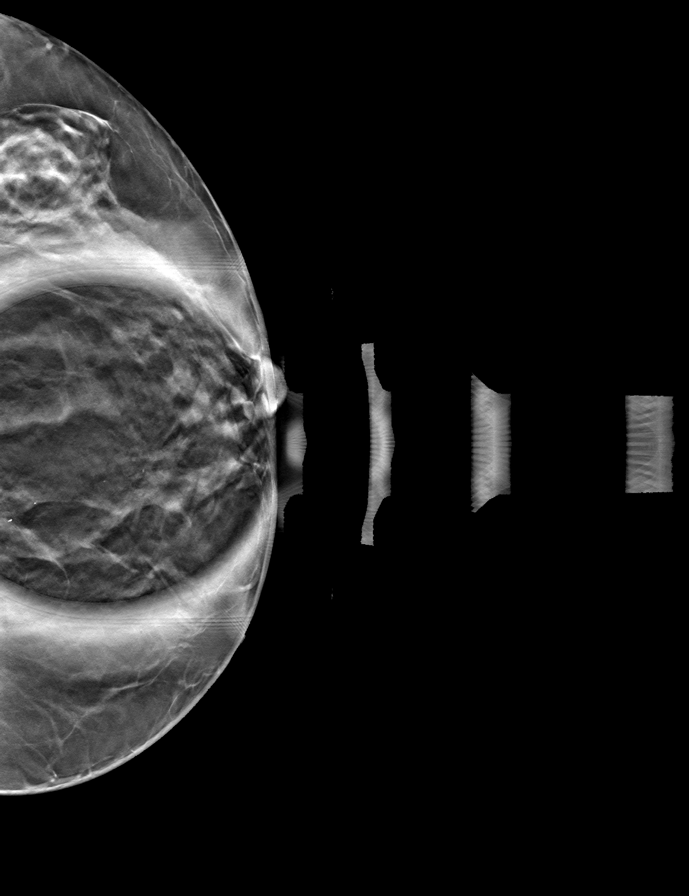

[8 of 22 positions shown; findings below may reference images not displayed]

ACR Breast Density Category c: The breast tissue is heterogeneously
dense, which may obscure small masses.
FINDINGS: Additional 2-D and 3-D images are performed. These views confirm
presence of subtle distortion in the upper inner quadrant of the
left breast, associated with scattered faint linear calcifications
spanning 3.6 x 3.9 x 0.5 cm in the upper inner quadrant.

Mammographic images were processed with CAD.

On physical exam, I palpate no abnormality in the upper inner
quadrant of the left breast.

Targeted ultrasound is performed, showing normal appearing
fibroglandular tissue in the upper inner quadrant of the left
breast. No mass or distortion identified. Evaluation of the left
axilla is negative for adenopathy.
IMPRESSION: Persistent subtle distortion and calcifications in the upper inner
quadrant of the left breast. Biopsy is indicated.

No adenopathy in the left axilla.

RECOMMENDATION:
Recommend stereotactic guided core biopsy the anterior aspect of the
calcifications, in the area of subtle distortion. If the biopsy is
positive for malignancy or atypia, bracketing of the entire area of
calcifications would be indicated.

I have discussed the findings and recommendations with the patient.
Results were also provided in writing at the conclusion of the
visit. If applicable, a reminder letter will be sent to the patient
regarding the next appointment.

BI-RADS CATEGORY  4: Suspicious.

## 2017-12-24 DIAGNOSIS — N6022 Fibroadenosis of left breast: Secondary | ICD-10-CM

## 2017-12-24 HISTORY — DX: Fibroadenosis of left breast: N60.22

## 2017-12-30 ENCOUNTER — Ambulatory Visit
Admission: RE | Admit: 2017-12-30 | Discharge: 2017-12-30 | Disposition: A | Discharge status: Home / Self Care | Payer: BLUE CROSS/BLUE SHIELD | Source: Ambulatory Visit | Attending: Obstetrics & Gynecology | Admitting: Obstetrics & Gynecology

## 2017-12-30 ENCOUNTER — Other Ambulatory Visit: Payer: Self-pay | Admitting: Obstetrics & Gynecology

## 2017-12-30 DIAGNOSIS — R921 Mammographic calcification found on diagnostic imaging of breast: Secondary | ICD-10-CM

## 2017-12-30 DIAGNOSIS — N6012 Diffuse cystic mastopathy of left breast: Secondary | ICD-10-CM | POA: Diagnosis not present

## 2017-12-30 IMAGING — MG MM CLIP PLACEMENT
6 series · 6 of 14 positions shown · non-contrast
Comparison: Previous exam(s).

CLINICAL DATA: Evaluate biopsy marker

EXAM:
DIAGNOSTIC LEFT MAMMOGRAM POST ULTRASOUND BIOPSY

[L ML]
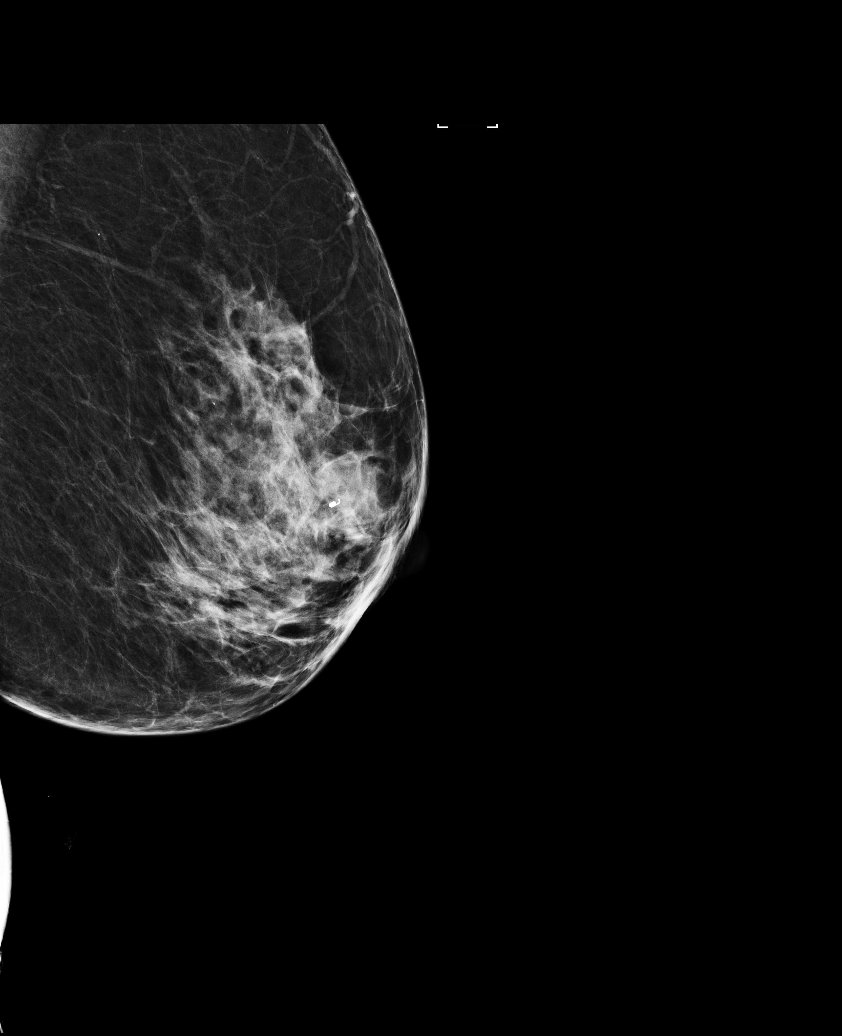

[L CC synth-2D]
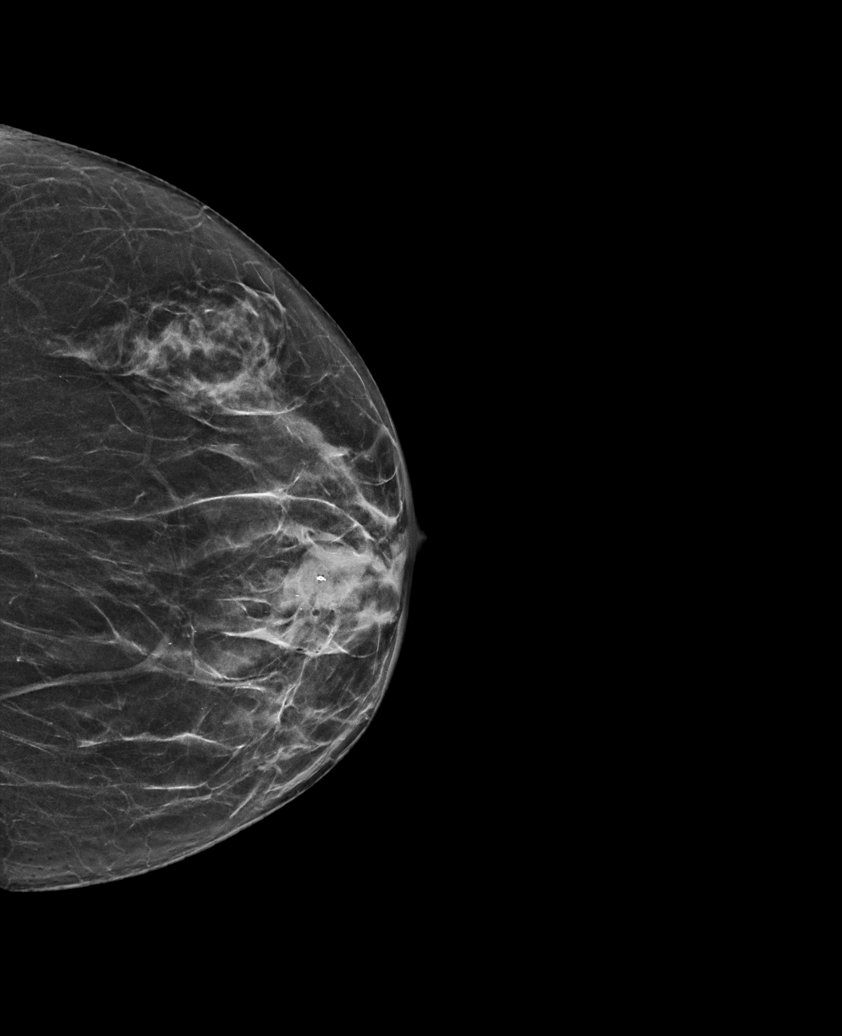

[L CC]
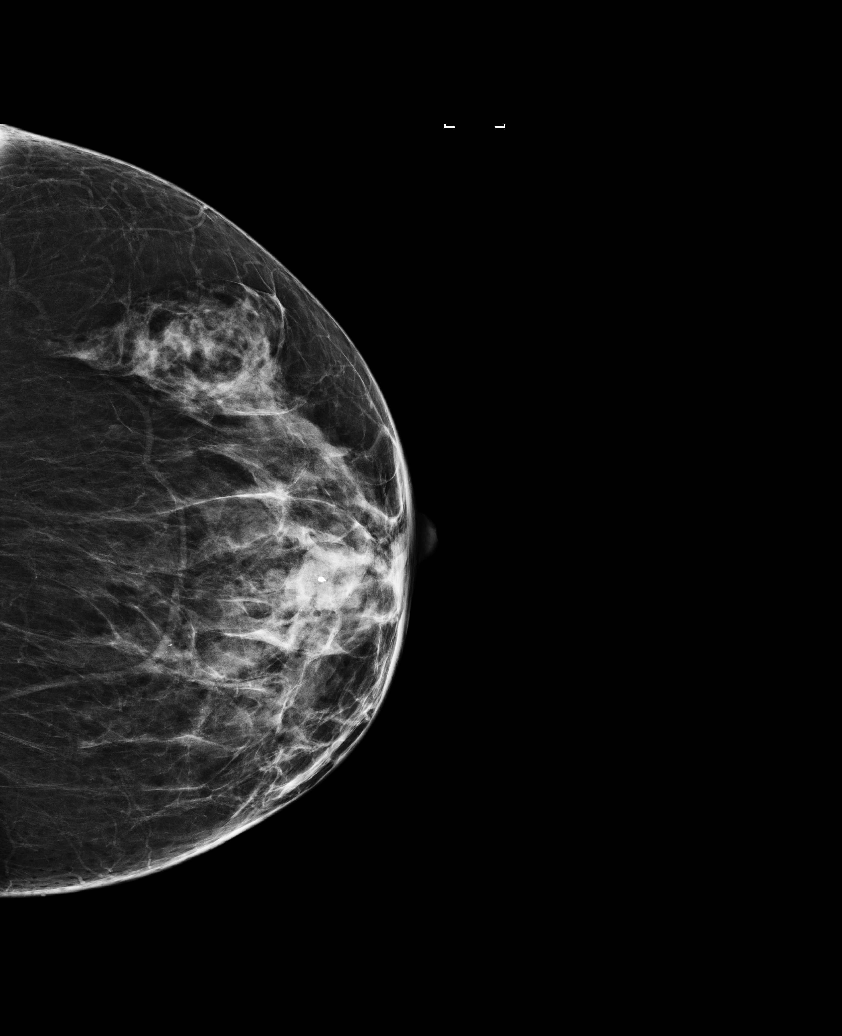

[L ML synth-2D]
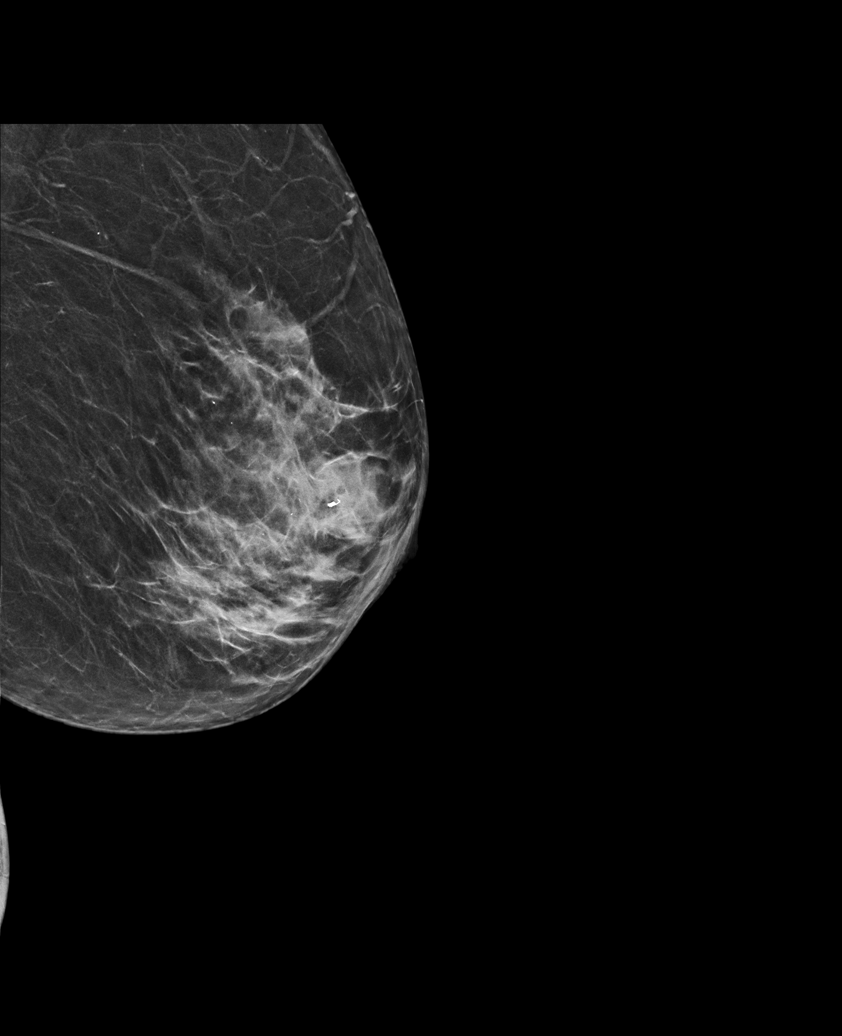

[L ML tomo · tomo slice 34/67.0]
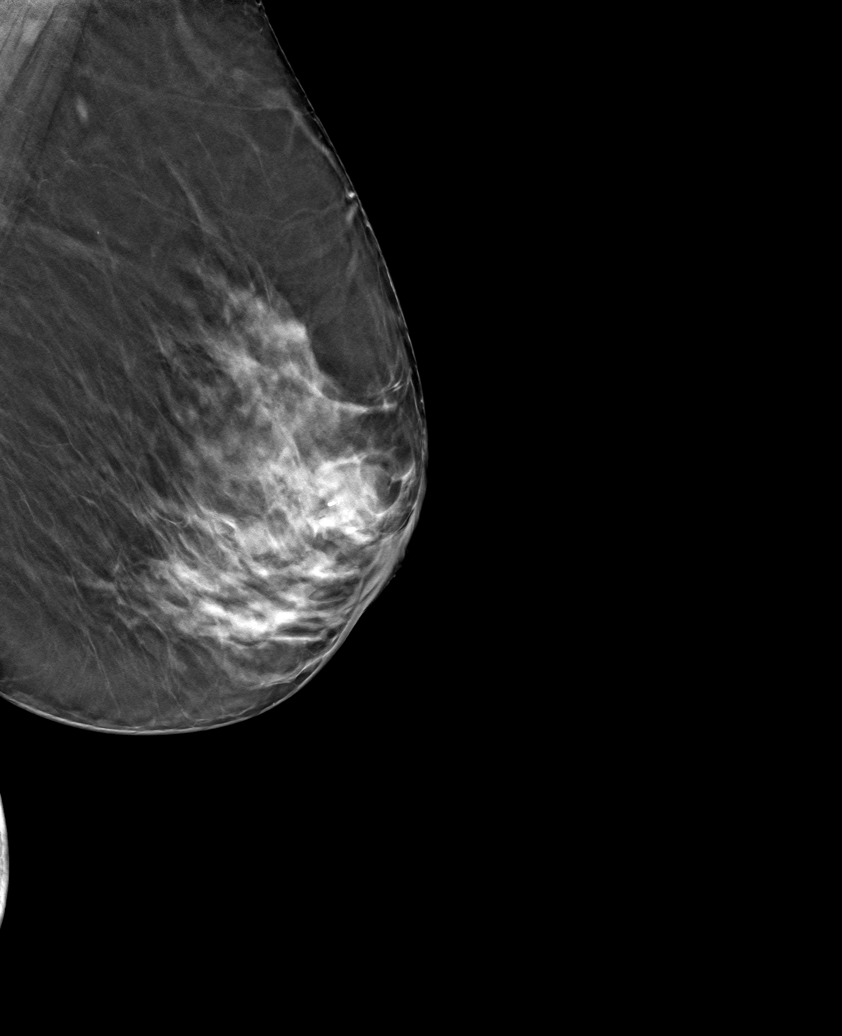

[L CC tomo · tomo slice 29/57.0]
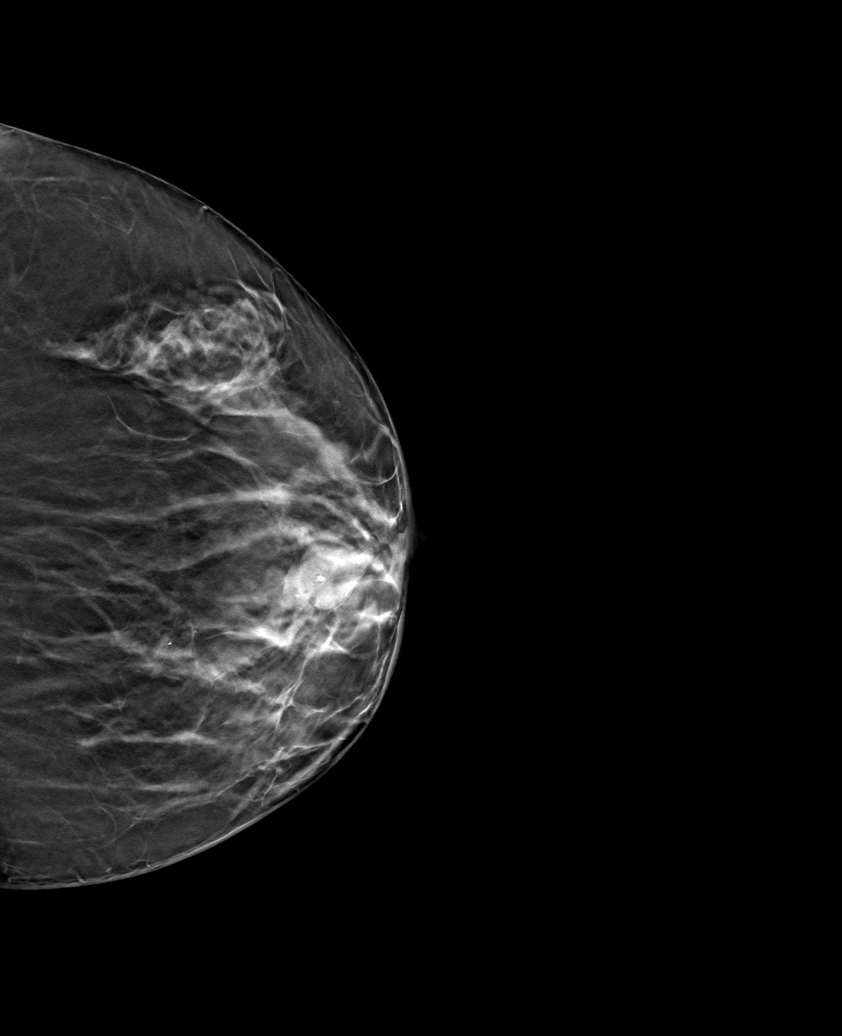

[6 of 14 positions shown; findings below may reference images not displayed]

FINDINGS: Mammographic images were obtained following ultrasound guided biopsy
of left breast distortion and scattered calcifications. The coil
shaped biopsy clip is along the anterior margin of the distortion.
One of the 3 calcifications in the region of distortion has been
removed and there is air at side distortion consistent with
appropriate sampling.
IMPRESSION: Clip placement as above.

Final Assessment: Post Procedure Mammograms for Marker Placement

## 2017-12-30 IMAGING — MG STEREOTACTIC CORE NEEDLE BIOPSY
8 of 9 series · 8 of 9 positions shown · non-contrast
Comparison: Previous exams.

ADDENDUM:
Pathology revealed COMPLEX SCLEROSING LESION AND FIBROCYSTIC CHANGE
WITH CALCIFICATIONS of the Left breast, upper inner. This was found
to be concordant by Dr. NTSRI. Pathology results were
discussed with the patient by telephone. The patient reported doing
well after the biopsy with tenderness at the site. Post biopsy
instructions and care were reviewed and questions were answered. The
patient was encouraged to call [REDACTED] for any additional concerns. Surgical consultation has been
arranged with Dr. NTSRI at [REDACTED] on
[DATE]. At the time of radioactive seed placement will
need bracket localization due to 3+ cm scattered calcifications
extending posterior to the biopsy site.

Pathology results reported by NTSRI, RN on [DATE].
CLINICAL DATA: Biopsy of distortion and calcifications
EXAM:
LEFT BREAST STEREOTACTIC CORE NEEDLE BIOPSY

[L (1 of 8)]
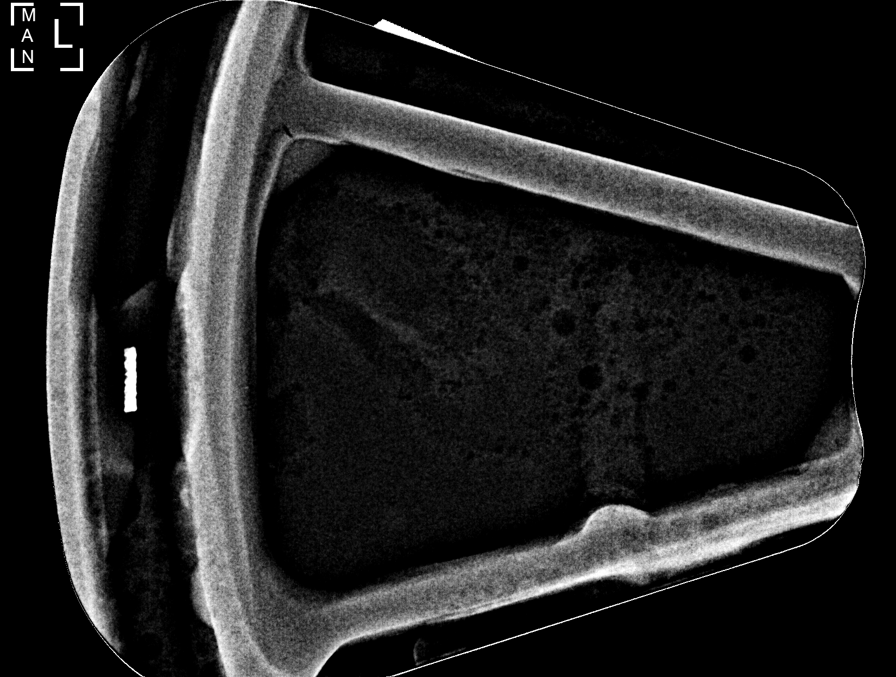

[L (2 of 8)]
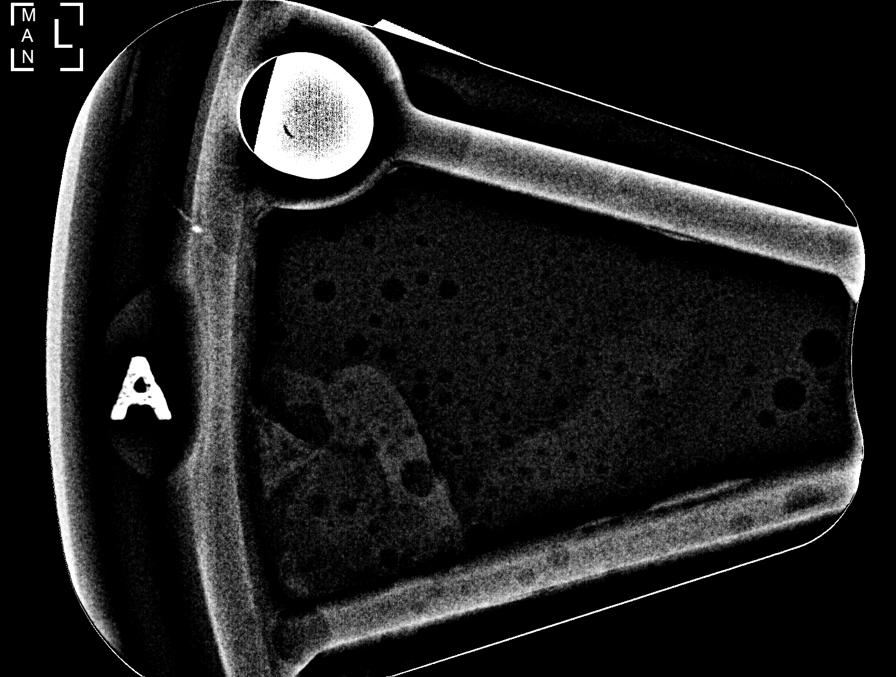

[L (3 of 8)]
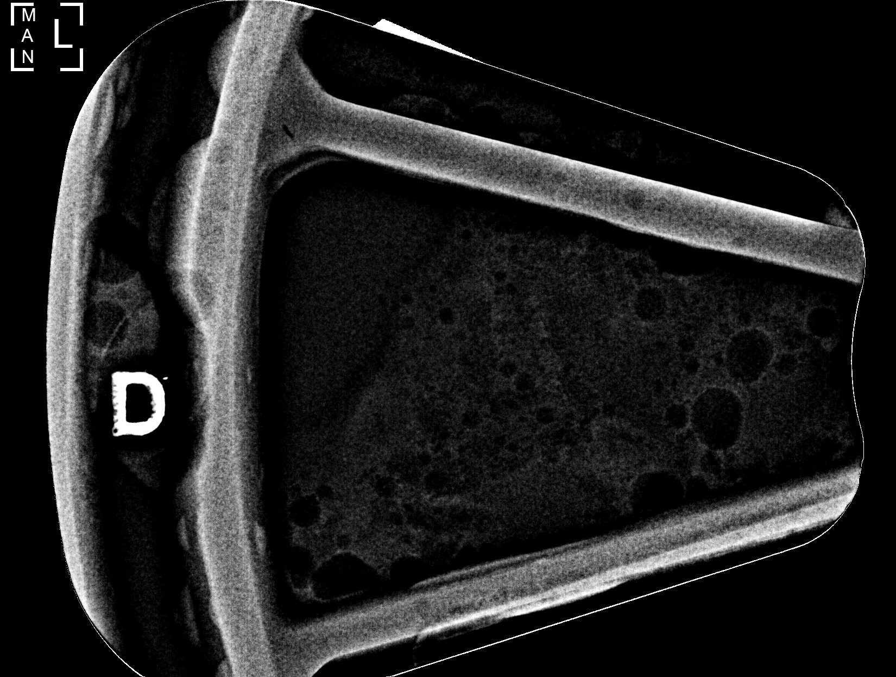

[L (4 of 8)]
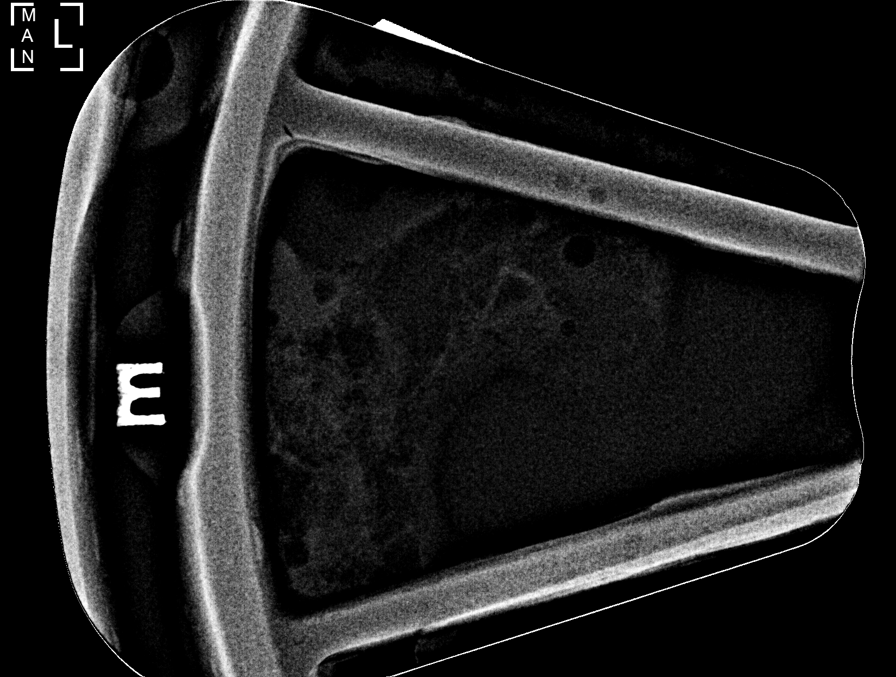

[L (5 of 8)]
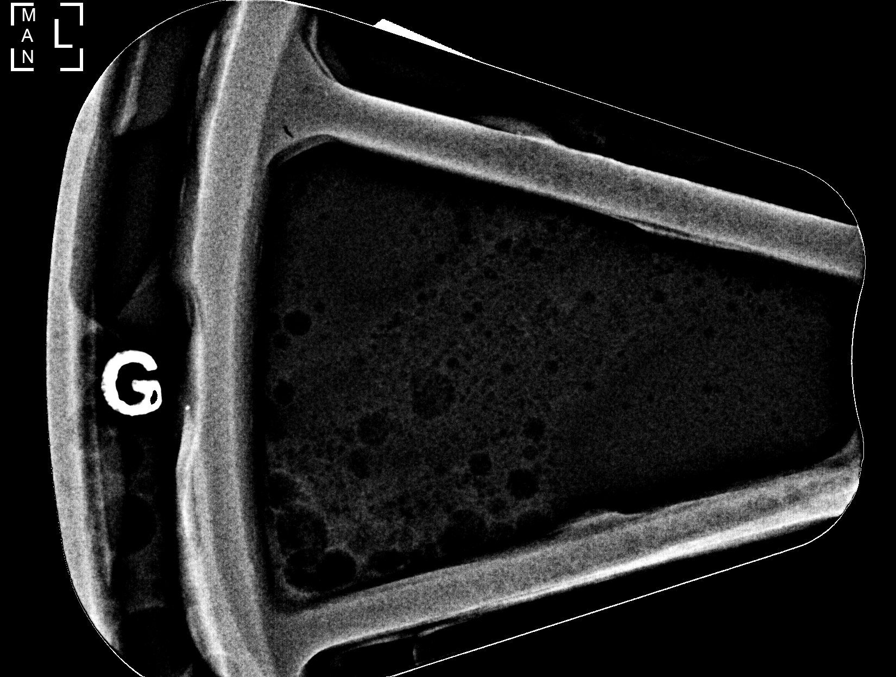

[L (6 of 8)]
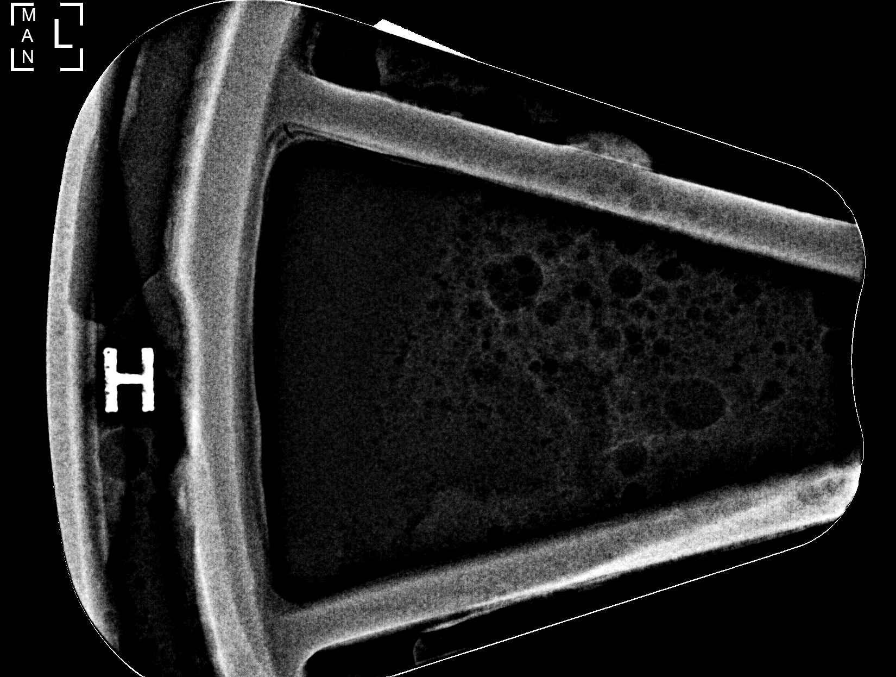

[L (7 of 8)]
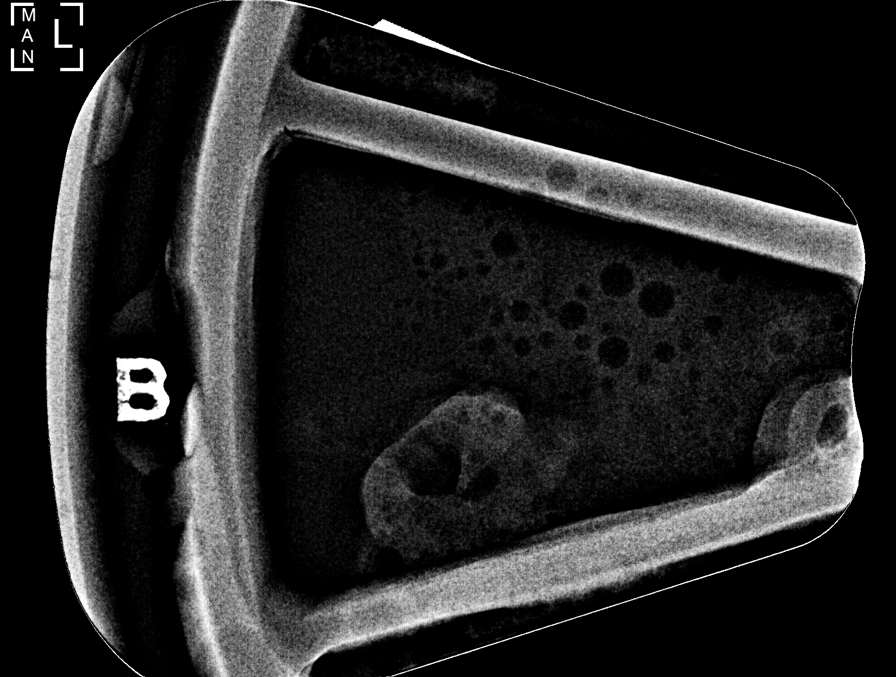

[L (8 of 8)]
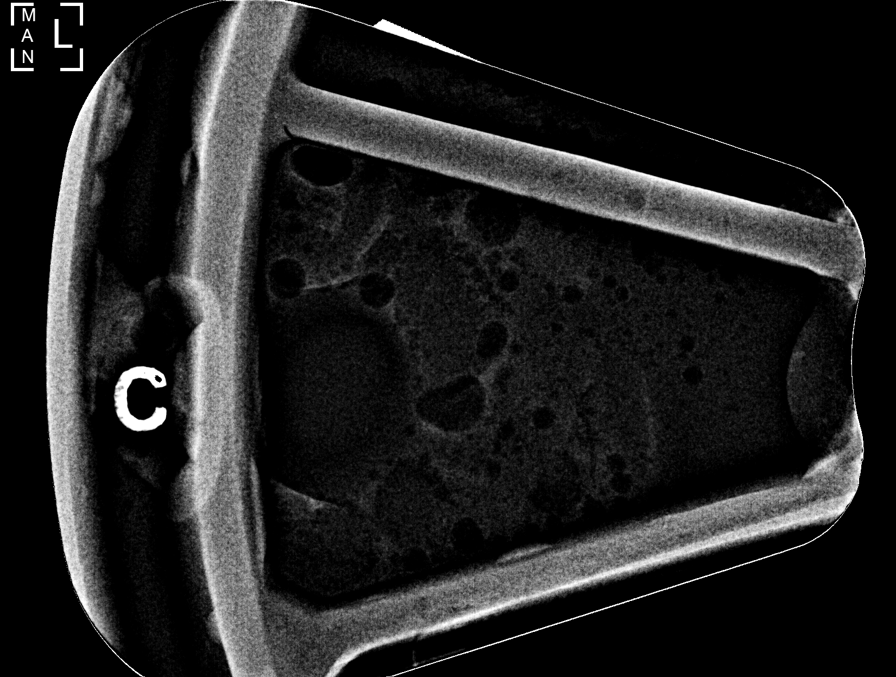

[8 of 9 positions shown; findings below may reference images not displayed]



Using sterile technique and 1% Lidocaine as local anesthetic, under
stereotactic guidance, a 9 gauge vacuum assisted device was used to
perform core needle biopsy of distortion with a few scattered
calcifications in the upper inner left breast using a superior
approach. Specimen radiograph was performed showing a calcification
in 1 specimen as expected. Specimens with calcifications are
identified for pathology.

Lesion quadrant: Upper-outer

At the conclusion of the procedure, a tissue marker clip was
deployed into the biopsy cavity. Follow-up 2-view mammogram was
performed and dictated separately.
IMPRESSION: Stereotactic-guided biopsy of left breast distortion and scattered
calcifications. No apparent complications.

## 2017-12-30 IMAGING — MG STEREOTACTIC CORE NEEDLE BIOPSY
3 series · 3 of 11 positions shown · non-contrast
Comparison: Previous exams.

ADDENDUM:
Pathology revealed COMPLEX SCLEROSING LESION AND FIBROCYSTIC CHANGE
WITH CALCIFICATIONS of the Left breast, upper inner. This was found
to be concordant by Dr. NTSRI. Pathology results were
discussed with the patient by telephone. The patient reported doing
well after the biopsy with tenderness at the site. Post biopsy
instructions and care were reviewed and questions were answered. The
patient was encouraged to call [REDACTED] for any additional concerns. Surgical consultation has been
arranged with Dr. NTSRI at [REDACTED] on
[DATE]. At the time of radioactive seed placement will
need bracket localization due to 3+ cm scattered calcifications
extending posterior to the biopsy site.

Pathology results reported by NTSRI, RN on [DATE].
CLINICAL DATA: Biopsy of distortion and calcifications
EXAM:
LEFT BREAST STEREOTACTIC CORE NEEDLE BIOPSY

[L CC]
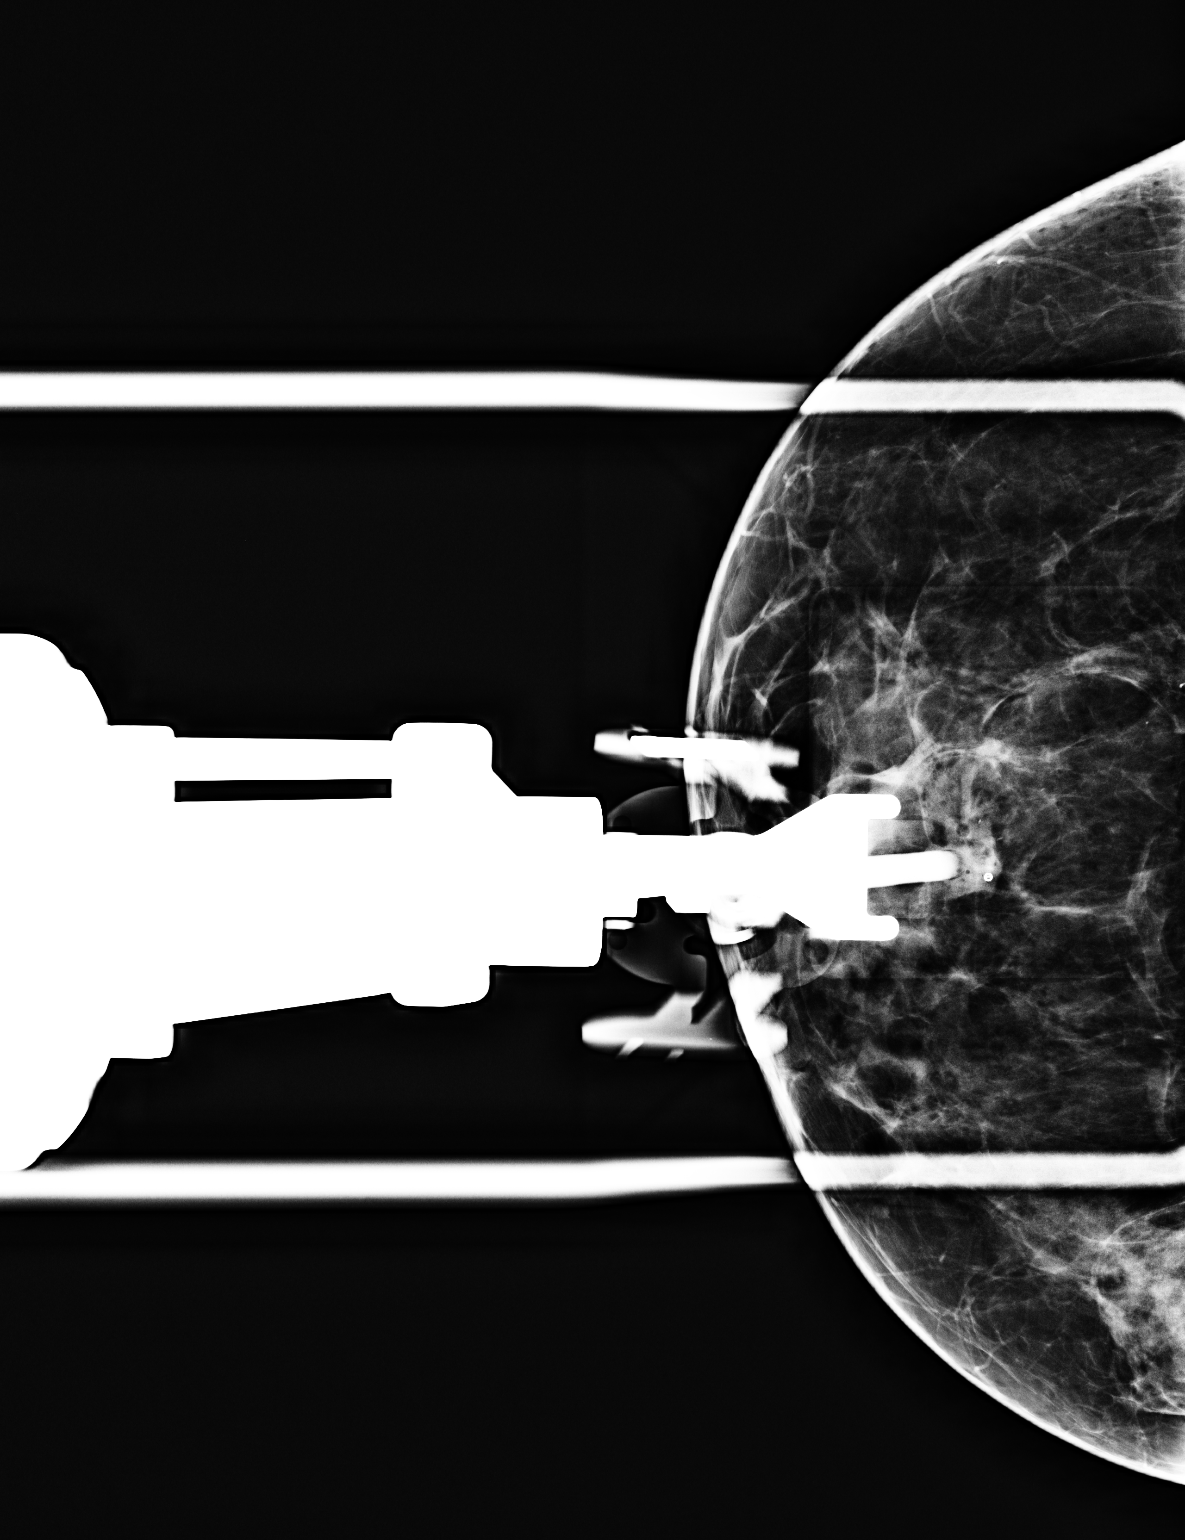

[L CC tomo · tomo slice 21/41.0]
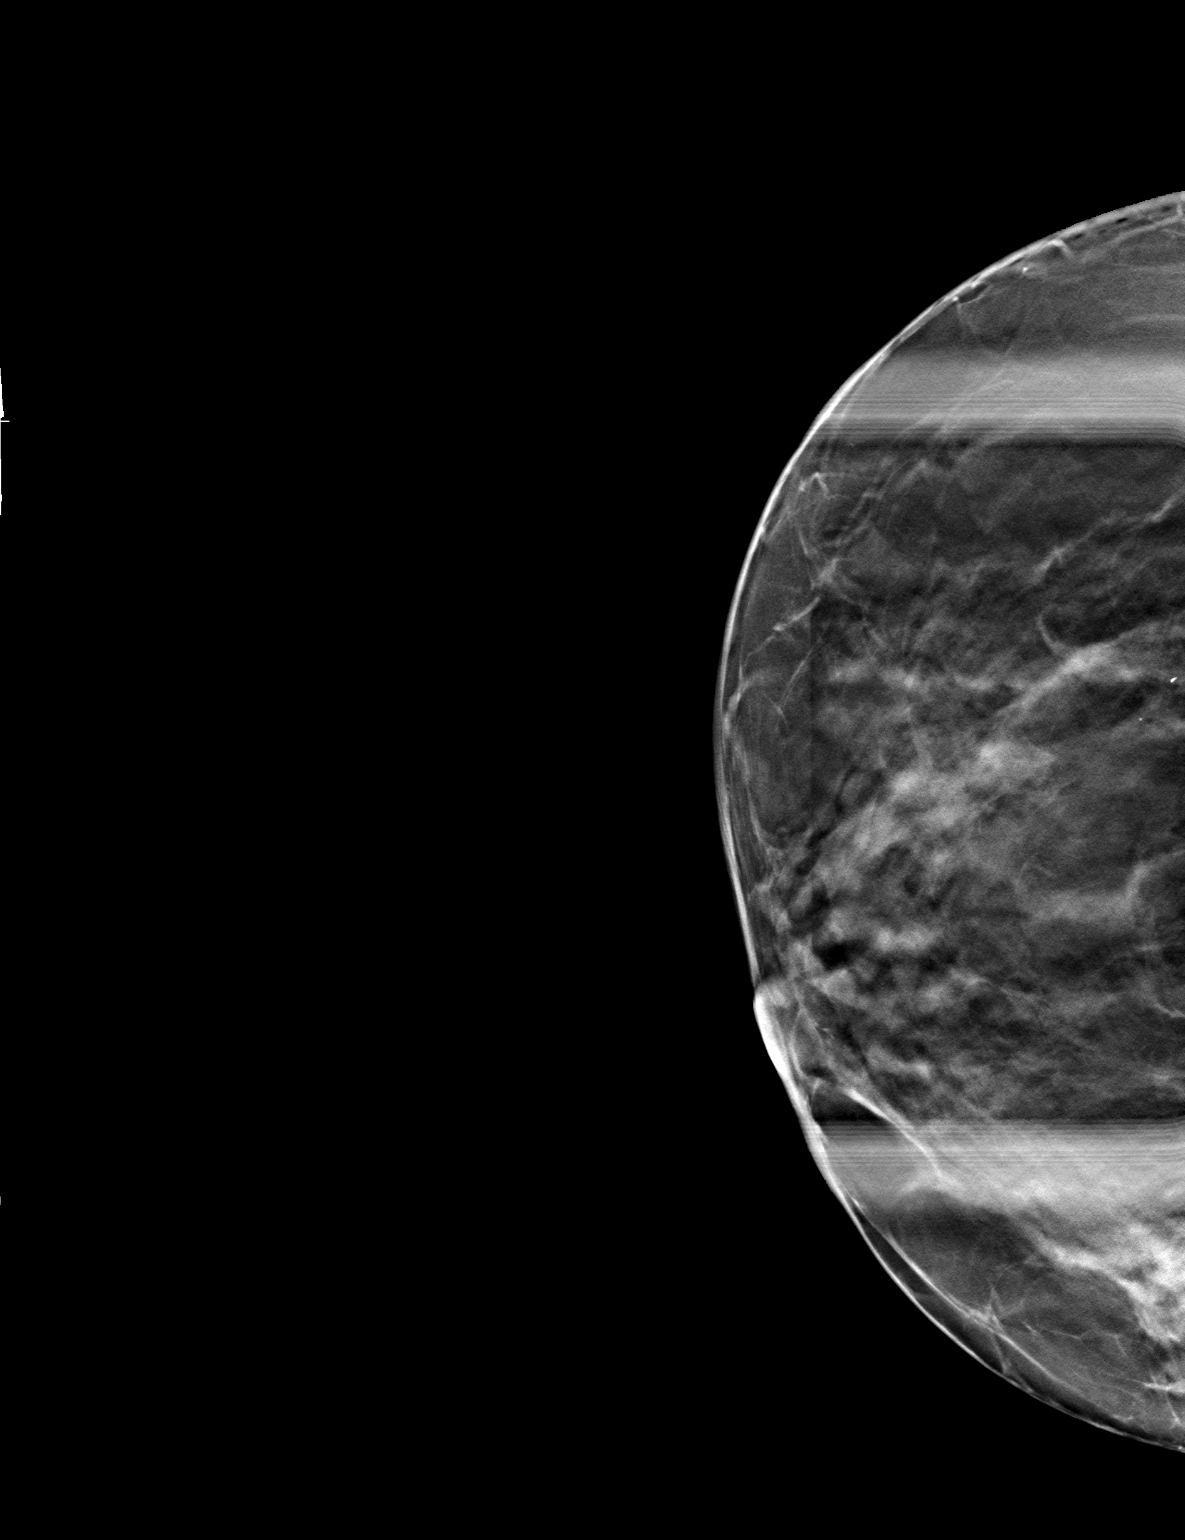

[L ML tomo · tomo slice 27/53.0]
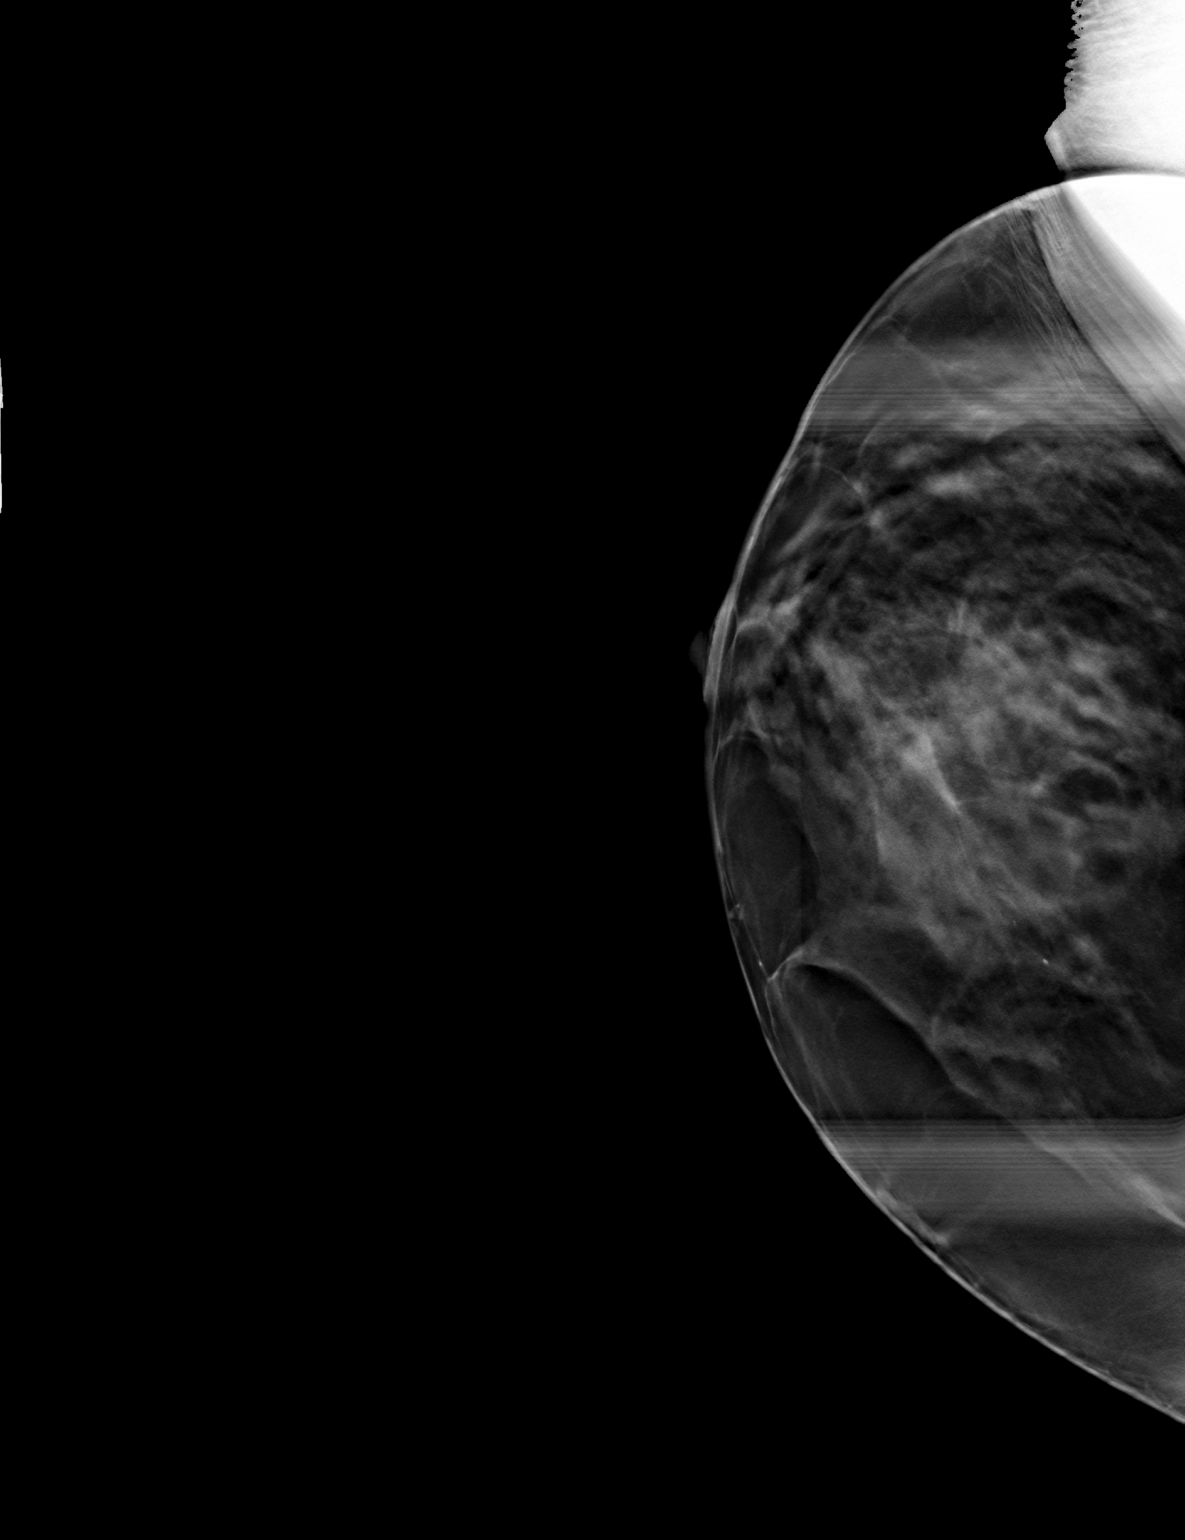

[3 of 11 positions shown; findings below may reference images not displayed]



Using sterile technique and 1% Lidocaine as local anesthetic, under
stereotactic guidance, a 9 gauge vacuum assisted device was used to
perform core needle biopsy of distortion with a few scattered
calcifications in the upper inner left breast using a superior
approach. Specimen radiograph was performed showing a calcification
in 1 specimen as expected. Specimens with calcifications are
identified for pathology.

Lesion quadrant: Upper-outer

At the conclusion of the procedure, a tissue marker clip was
deployed into the biopsy cavity. Follow-up 2-view mammogram was
performed and dictated separately.
IMPRESSION: Stereotactic-guided biopsy of left breast distortion and scattered
calcifications. No apparent complications.

## 2018-01-01 DIAGNOSIS — E119 Type 2 diabetes mellitus without complications: Secondary | ICD-10-CM | POA: Diagnosis not present

## 2018-01-07 ENCOUNTER — Other Ambulatory Visit: Payer: Self-pay | Admitting: Surgery

## 2018-01-07 DIAGNOSIS — N6022 Fibroadenosis of left breast: Secondary | ICD-10-CM

## 2018-01-14 DIAGNOSIS — I10 Essential (primary) hypertension: Secondary | ICD-10-CM | POA: Diagnosis not present

## 2018-01-14 DIAGNOSIS — E119 Type 2 diabetes mellitus without complications: Secondary | ICD-10-CM | POA: Diagnosis not present

## 2018-01-14 DIAGNOSIS — Z0000 Encounter for general adult medical examination without abnormal findings: Secondary | ICD-10-CM | POA: Diagnosis not present

## 2018-01-14 DIAGNOSIS — I471 Supraventricular tachycardia: Secondary | ICD-10-CM | POA: Diagnosis not present

## 2018-01-23 ENCOUNTER — Other Ambulatory Visit: Payer: Self-pay

## 2018-01-23 ENCOUNTER — Encounter (HOSPITAL_BASED_OUTPATIENT_CLINIC_OR_DEPARTMENT_OTHER): Payer: Self-pay | Admitting: *Deleted

## 2018-01-23 NOTE — Pre-Procedure Instructions (Signed)
EKG and cardiology notes received from [facility]; will not need another EKG prior to surgery.

## 2018-01-23 NOTE — Pre-Procedure Instructions (Signed)
Cardiology notes requested from [facility]; pt. to come for BMET and EKG; to pick up Ensure pre-surgery drink 10 oz. - to drink by 0400 DOS.

## 2018-01-27 ENCOUNTER — Encounter (HOSPITAL_BASED_OUTPATIENT_CLINIC_OR_DEPARTMENT_OTHER)
Admission: RE | Admit: 2018-01-27 | Discharge: 2018-01-27 | Disposition: A | Discharge status: Home / Self Care | Payer: BLUE CROSS/BLUE SHIELD | Source: Ambulatory Visit | Attending: Surgery | Admitting: Surgery

## 2018-01-27 DIAGNOSIS — Z79899 Other long term (current) drug therapy: Secondary | ICD-10-CM | POA: Diagnosis not present

## 2018-01-27 DIAGNOSIS — Z8041 Family history of malignant neoplasm of ovary: Secondary | ICD-10-CM | POA: Diagnosis not present

## 2018-01-27 DIAGNOSIS — N6022 Fibroadenosis of left breast: Secondary | ICD-10-CM | POA: Diagnosis not present

## 2018-01-27 DIAGNOSIS — E119 Type 2 diabetes mellitus without complications: Secondary | ICD-10-CM | POA: Diagnosis not present

## 2018-01-27 DIAGNOSIS — Z803 Family history of malignant neoplasm of breast: Secondary | ICD-10-CM | POA: Diagnosis not present

## 2018-01-27 DIAGNOSIS — Z7982 Long term (current) use of aspirin: Secondary | ICD-10-CM | POA: Diagnosis not present

## 2018-01-27 DIAGNOSIS — N6489 Other specified disorders of breast: Secondary | ICD-10-CM | POA: Diagnosis not present

## 2018-01-27 DIAGNOSIS — I10 Essential (primary) hypertension: Secondary | ICD-10-CM | POA: Diagnosis not present

## 2018-01-27 DIAGNOSIS — Z7984 Long term (current) use of oral hypoglycemic drugs: Secondary | ICD-10-CM | POA: Diagnosis not present

## 2018-01-27 LAB — BASIC METABOLIC PANEL
Anion gap: 14 (ref 5–15)
BUN: 6 mg/dL (ref 6–20)
CO2: 25 mmol/L (ref 22–32)
Calcium: 9.5 mg/dL (ref 8.9–10.3)
Chloride: 100 mmol/L — ABNORMAL LOW (ref 101–111)
Creatinine, Ser: 0.65 mg/dL (ref 0.44–1.00)
GFR calc Af Amer: >60 mL/min (ref >60)
GFR calc non Af Amer: >60 mL/min (ref >60)
Glucose, Bld: 73 mg/dL (ref 65–99)
Potassium: 3.9 mmol/L (ref 3.5–5.1)
Sodium: 139 mmol/L (ref 135–145)

## 2018-01-27 NOTE — Progress Notes (Signed)
Ensure pre surgery drink given with instructions to complete by 0400 dos, pt verbalized understanding. 

## 2018-01-28 ENCOUNTER — Ambulatory Visit
Admission: RE | Admit: 2018-01-28 | Discharge: 2018-01-28 | Disposition: A | Discharge status: Home / Self Care | Payer: BLUE CROSS/BLUE SHIELD | Source: Ambulatory Visit | Attending: Surgery | Admitting: Surgery

## 2018-01-28 DIAGNOSIS — R921 Mammographic calcification found on diagnostic imaging of breast: Secondary | ICD-10-CM | POA: Diagnosis not present

## 2018-01-28 DIAGNOSIS — R928 Other abnormal and inconclusive findings on diagnostic imaging of breast: Secondary | ICD-10-CM | POA: Diagnosis not present

## 2018-01-28 DIAGNOSIS — N6022 Fibroadenosis of left breast: Secondary | ICD-10-CM

## 2018-01-28 IMAGING — MG NEEDLE LOCALIZATION OF THE LEFT BREAST WITH MAMMO GUIDANCE
8 of 10 series · 8 of 10 positions shown · non-contrast
Comparison: Previous exam(s).

CLINICAL DATA: Patient with complex sclerosing lesion of the left
breast scheduled for surgical excision requiring preoperative
radioactive seed localization.

Additional calcifications within the upper-outer quadrant of the
left breast for which additional bracketed radioactive seed
localization of the most peripheral calcification has been
requested.
EXAM:
MAMMOGRAPHIC GUIDED RADIOACTIVE SEED LOCALIZATION OF THE LEFT BREAST
x2

[L ML (1 of 4)]
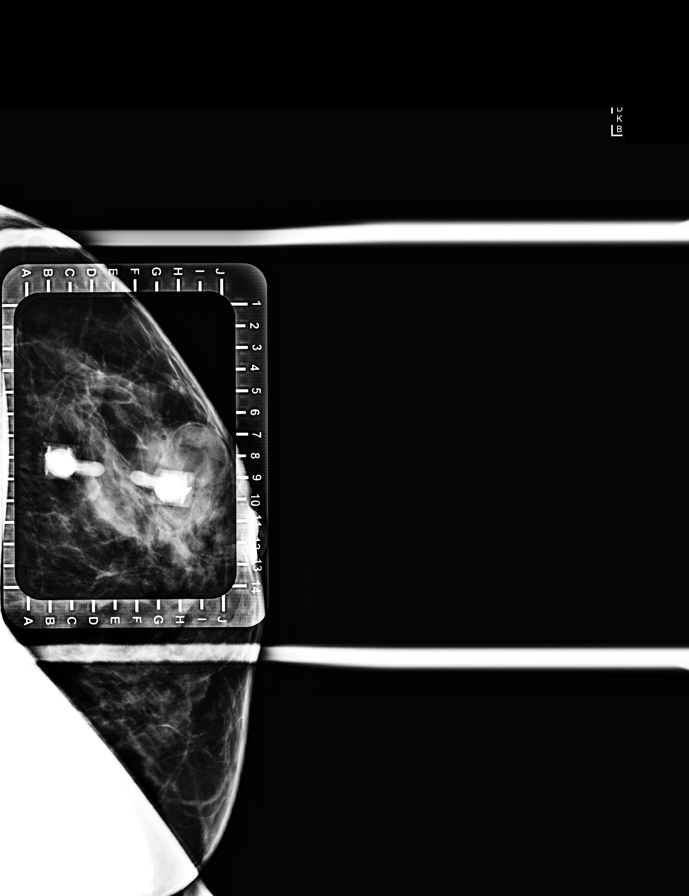

[L CC (1 of 4)]
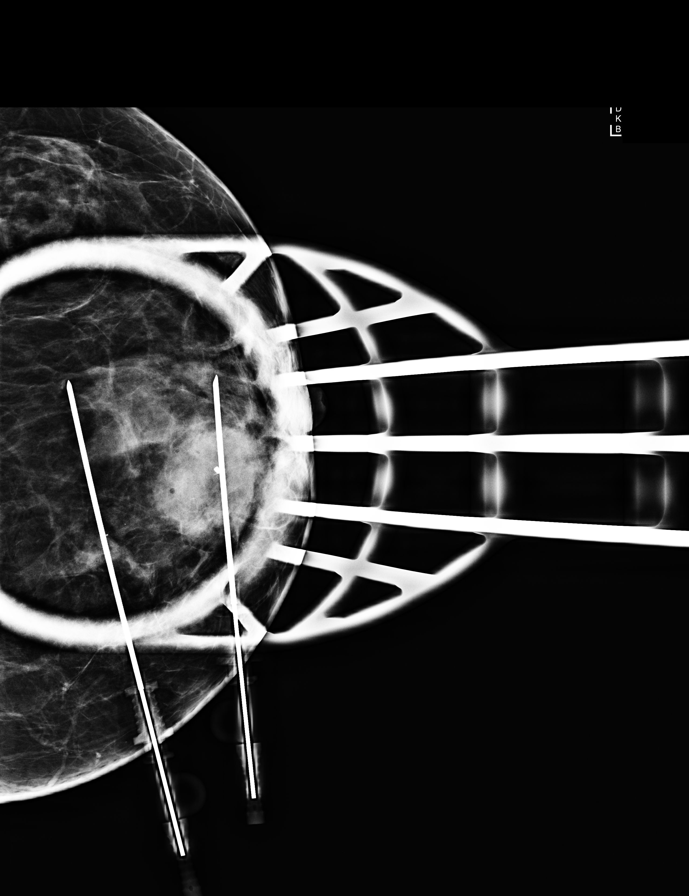

[L ML (2 of 4)]
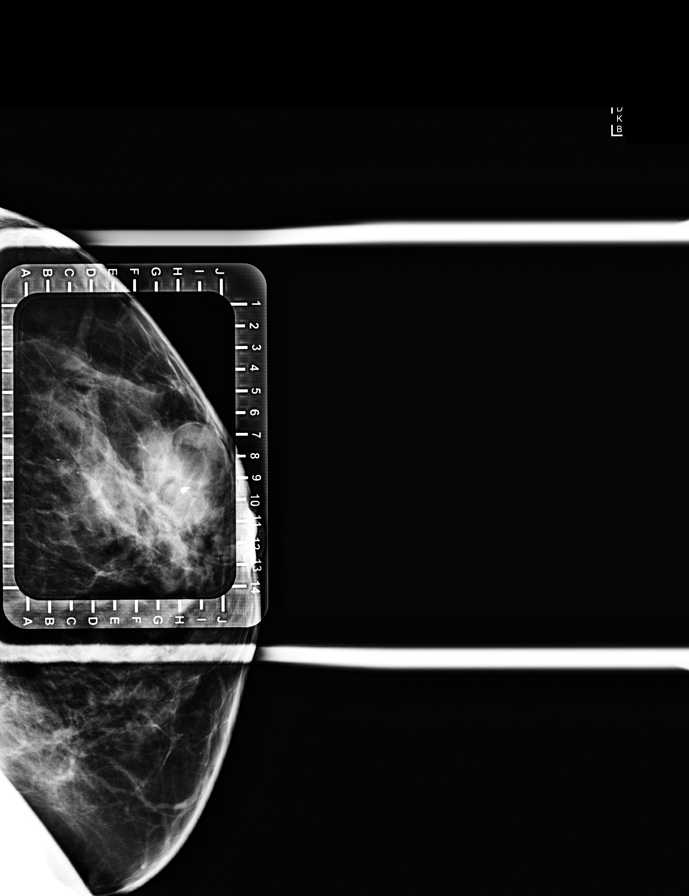

[L CC (2 of 4)]
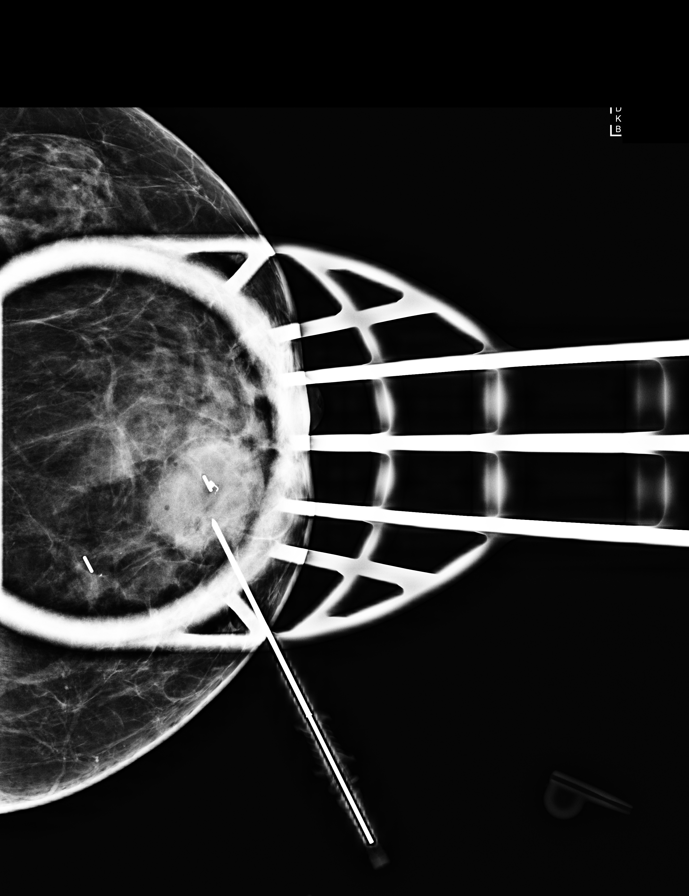

[L CC (3 of 4)]
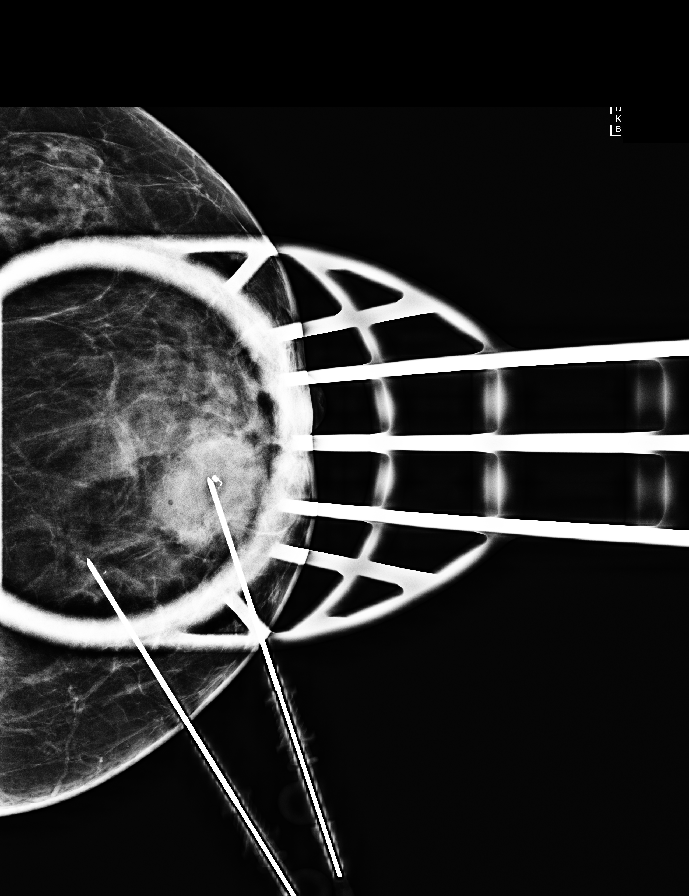

[L CC (4 of 4)]
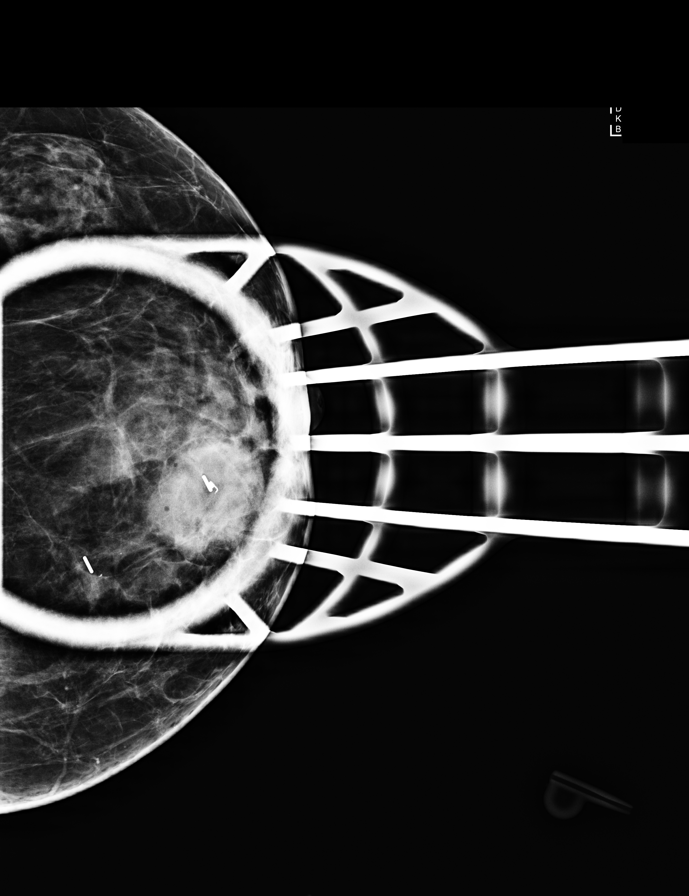

[L ML (3 of 4)]
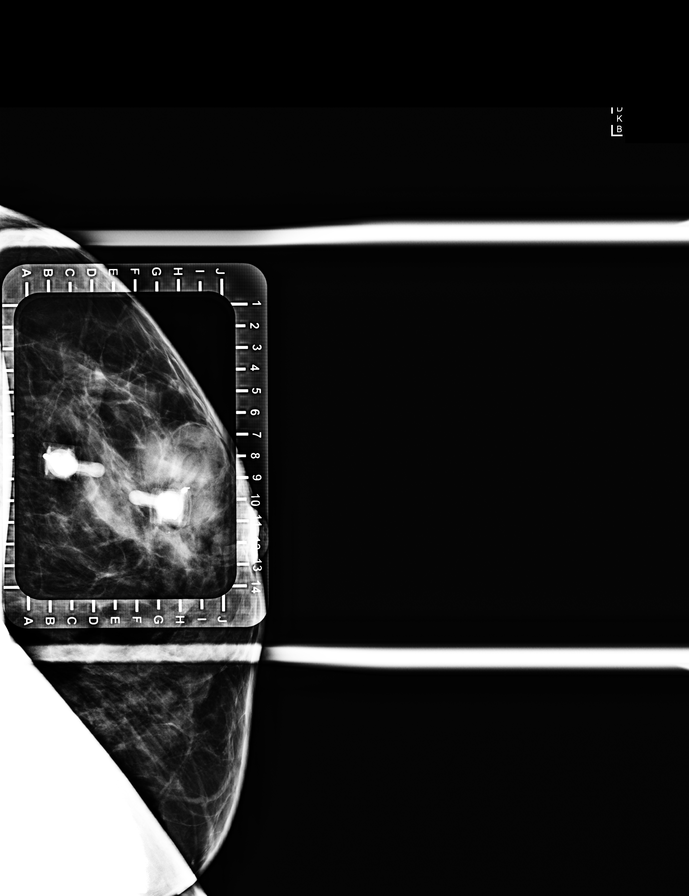

[L ML (4 of 4)]
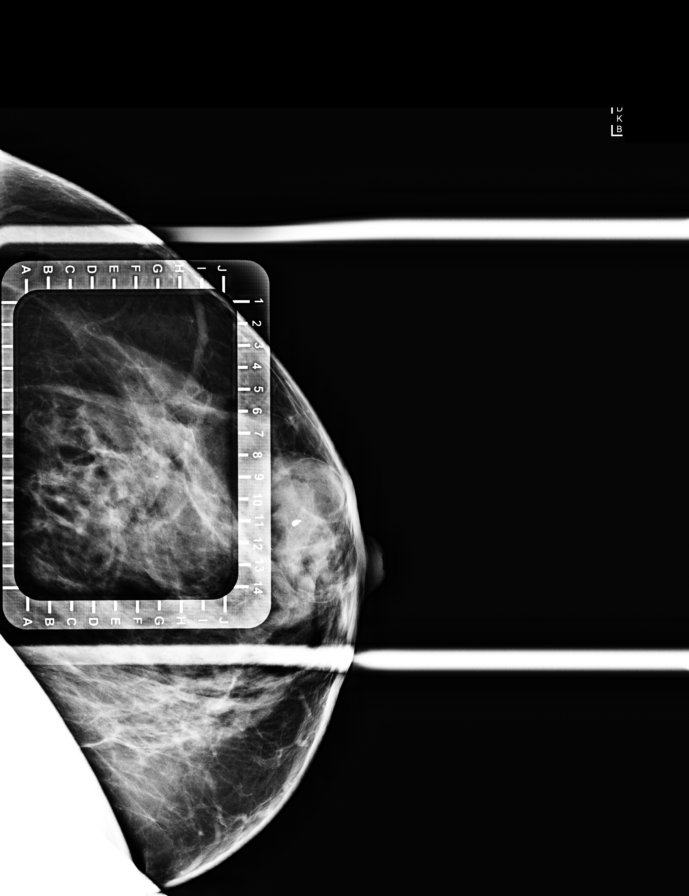

[8 of 10 positions shown; findings below may reference images not displayed]

FINDINGS: Patient presents for radioactive seed localization prior to surgical
excision. I met with the patient and we discussed the procedure of
seed localization including benefits and alternatives. We discussed
the high likelihood of a successful procedure. We discussed the
risks of the procedure including infection, bleeding, tissue injury
and further surgery. We discussed the low dose of radioactivity
involved in the procedure. Informed, written consent was given.

The usual time-out protocol was performed immediately prior to the
procedure.

Site 1: Using mammographic guidance, sterile technique, 1% lidocaine
and an [D9] radioactive seed, the most peripheral calcification
within the upper-outer quadrant of the left breast was localized
using a medial approach.

Site 2: Next, using mammographic guidance, sterile technique, 1%
lidocaine and an [D9] radioactive seed, the coil shaped clip within
the anterior left breast was localized using a medial approach.

The follow-up mammogram images confirm the 2 seeds are in the
expected location and were marked for Dr. MELENDEZ.

Follow-up survey of the patient confirms presence of the radioactive
seeds.

Site 1: Order number of [D9] seed:  [PHONE_NUMBER].

Total activity:  0.248 millicuries reference Date: [DATE]

Site 2: Order number [D9] seed: [PHONE_NUMBER]

Total activity: 0.256 millicuries reference date: [DATE]

The patient tolerated the procedure well and was released from the
[REDACTED]. She was given instructions regarding seed removal.
IMPRESSION: Bracketed radioactive seed localization left breast (2 sites). No
apparent complications.

Positions of the seeds relative to the clip and calcifications were
discussed with Dr. MELENDEZ at the conclusion of today's procedure.

## 2018-01-29 NOTE — Anesthesia Preprocedure Evaluation (Addendum)
Anesthesia Evaluation  Patient identified by MRN, date of birth, ID band Patient awake    Reviewed: Allergy & Precautions, NPO status , Patient's Chart, lab work & pertinent test results  Airway Mallampati: II  TM Distance: >3 FB     Dental no notable dental hx.    Pulmonary neg pulmonary ROS,    Pulmonary exam normal        Cardiovascular Exercise Tolerance: Good hypertension,  Rhythm:Regular Rate:Normal     Neuro/Psych    GI/Hepatic Neg liver ROS, GERD  ,  Endo/Other  diabetes, Type 2  Renal/GU negative Renal ROS     Musculoskeletal   Abdominal   Peds  Hematology negative hematology ROS (+)   Anesthesia Other Findings   Reproductive/Obstetrics                           No results found for: WBC, HGB, HCT, MCV, PLT .lastrenal Anesthesia Physical Anesthesia Plan  ASA: II  Anesthesia Plan: General   Post-op Pain Management:    Induction: Intravenous  PONV Risk Score and Plan: 4 or greater and Treatment may vary due to age or medical condition, Ondansetron, Dexamethasone and Midazolam  Airway Management Planned: LMA  Additional Equipment:   Intra-op Plan:   Post-operative Plan: Extubation in OR  Informed Consent: I have reviewed the patients History and Physical, chart, labs and discussed the procedure including the risks, benefits and alternatives for the proposed anesthesia with the patient or authorized representative who has indicated his/her understanding and acceptance.   Dental advisory given  Plan Discussed with: CRNA and Anesthesiologist  Anesthesia Plan Comments:         Anesthesia Quick Evaluation

## 2018-01-29 NOTE — H&P (Signed)
Jennifer [name] Documented: 01/07/2018 11:04 AM Location: [facility] Patient #: [id number] DOB: 19-Jun-1951 Married / Language: English / Race: Black or African American Female   History of Present Illness (Siaosi Alter A. Magnus IvanBlackman MD; 01/07/2018 11:32 AM) The patient is a 67 year old female who presents with a complaint of Breast problems. This patient is referred by Dr. Gerome Samavid Williams after the recent finding of a complex sclerosing lesion of the left breast. She had a 3 cm area of calcifications seen in the upper inner quadrant of the left breast on screening mammography. The most anterior portion was biopsied by stereotactic guidance and showed a complex sclerosing lesion with fibrocystic changes. She has had no previous problems with her breasts. She denies nipple discharge. She has no previous breast surgery. She is otherwise without complaints. Multiple family members have had malignancy. She has had breast cancer and a cousin as well as ovarian cancer in a cousin. She has no cardiopulmonary complaints.   Past Surgical History (Tanisha A. Manson PasseyBrown, RMA; 01/07/2018 11:04 AM) Breast Biopsy  Left. Cesarean Section - Multiple   Diagnostic Studies History (Tanisha A. Manson PasseyBrown, RMA; 01/07/2018 11:04 AM) Colonoscopy  1-5 years ago Mammogram  within last year Pap Smear  1-5 years ago  Allergies (Tanisha A. Manson PasseyBrown, RMA; 01/07/2018 11:06 AM) Accupril *ANTIHYPERTENSIVES*  Penicillins  Allergies Reconciled   Medication History (Tanisha A. Manson PasseyBrown, RMA; 01/07/2018 11:08 AM) Verapamil HCl ER (240MG  Tablet ER, Oral) Active. Aspirin (81MG  Tablet DR, Oral) Active. Nitroglycerin (0.2MG /HR Patch 24HR, Transdermal) Active. Telmisartan-HCTZ (80-12.5MG  Tablet, Oral) Active. Rosuvastatin Calcium (20MG  Tablet, Oral) Active. MetFORMIN HCl (1000MG  Tablet, Oral) Active. Cyclobenzaprine HCl (10MG  Tablet, Oral) Active. Multi-Vitamin (Oral) Active. Ondansetron (4MG  Tablet Disint,  Oral) Active. Medications Reconciled  Social History (Tanisha A. Manson PasseyBrown, RMA; 01/07/2018 11:04 AM) Caffeine use  Carbonated beverages, Coffee, Tea. No alcohol use  No drug use  Tobacco use  Never smoker.  Family History (Tanisha A. Manson PasseyBrown, RMA; 01/07/2018 11:04 AM) Alcohol Abuse  Brother. Arthritis  Mother, Sister. Cancer  Daughter, Father. Cervical Cancer  Family Members In General. Diabetes Mellitus  Brother, Father, Mother, Sister. Heart Disease  Father. Heart disease in female family member before age 65  Hypertension  Father.  Pregnancy / Birth History (Tanisha A. Manson PasseyBrown, RMA; 01/07/2018 11:04 AM) Age at menarche  11 years. Age of menopause  2656-62 Contraceptive History  Contraceptive implant, Oral contraceptives. Gravida  3 Length (months) of breastfeeding  3-6 Maternal age  65-25 Para  2  Other Problems (Tanisha A. Manson PasseyBrown, RMA; 01/07/2018 11:04 AM) Arthritis  Asthma  Bladder Problems  Diabetes Mellitus  Gastroesophageal Reflux Disease  Hemorrhoids  High blood pressure  Kidney Stone     Review of Systems (Tanisha A. Brown RMA; 01/07/2018 11:04 AM) General Not Present- Appetite Loss, Chills, Fatigue, Fever, Night Sweats, Weight Gain and Weight Loss. Skin Not Present- Change in Wart/Mole, Dryness, Hives, Jaundice, New Lesions, Non-Healing Wounds, Rash and Ulcer. HEENT Present- Wears glasses/contact lenses. Not Present- Earache, Hearing Loss, Hoarseness, Nose Bleed, Oral Ulcers, Ringing in the Ears, Seasonal Allergies, Sinus Pain, Sore Throat, Visual Disturbances and Yellow Eyes. Respiratory Not Present- Bloody sputum, Chronic Cough, Difficulty Breathing, Snoring and Wheezing. Cardiovascular Not Present- Chest Pain, Difficulty Breathing Lying Down, Leg Cramps, Palpitations, Rapid Heart Rate, Shortness of Breath and Swelling of Extremities. Gastrointestinal Not Present- Abdominal Pain, Bloating, Bloody Stool, Change in Bowel Habits, Chronic diarrhea,  Constipation, Difficulty Swallowing, Excessive gas, Gets full quickly at meals, Hemorrhoids, Indigestion, Nausea, Rectal Pain and Vomiting. Female  Genitourinary Not Present- Frequency, Nocturia, Painful Urination, Pelvic Pain and Urgency. Musculoskeletal Present- Joint Pain, Joint Stiffness and Muscle Pain. Not Present- Back Pain, Muscle Weakness and Swelling of Extremities. Neurological Not Present- Decreased Memory, Fainting, Headaches, Numbness, Seizures, Tingling, Tremor, Trouble walking and Weakness. Psychiatric Not Present- Anxiety, Bipolar, Change in Sleep Pattern, Depression, Fearful and Frequent crying. Endocrine Not Present- Cold Intolerance, Excessive Hunger, Hair Changes, Heat Intolerance, Hot flashes and New Diabetes. Hematology Present- Blood Thinners. Not Present- Easy Bruising, Excessive bleeding, Gland problems, HIV and Persistent Infections.  Vitals (Tanisha A. Brown RMA; 01/07/2018 11:05 AM) 01/07/2018 11:05 AM Weight: 154.8 lb Height: 61in Body Surface Area: 1.69 m Body Mass Index: 29.25 kg/m  Temp.: 54F  Pulse: 78 (Regular)  BP: 124/86 (Sitting, Left Arm, Standard)       Physical Exam (Hendry Speas A. Magnus Ivan MD; 01/07/2018 11:33 AM) General Mental Status-Alert. General Appearance-Consistent with stated age. Hydration-Well hydrated. Voice-Normal.  Head and Neck Head-normocephalic, atraumatic with no lesions or palpable masses. Trachea-midline. Thyroid Gland Characteristics - normal size and consistency.  Eye Eyeball - Bilateral-Extraocular movements intact. Sclera/Conjunctiva - Bilateral-No scleral icterus.  Chest and Lung Exam Chest and lung exam reveals -quiet, even and easy respiratory effort with no use of accessory muscles and on auscultation, normal breath sounds, no adventitious sounds and normal vocal resonance. Inspection Chest Wall - Normal. Back - normal.  Breast Breast - Left-Symmetric, Non Tender, No Biopsy  scars, no Dimpling, No Inflammation, No Lumpectomy scars, No Mastectomy scars, No Peau d' Orange. Breast - Right-Symmetric, Non Tender, No Biopsy scars, no Dimpling, No Inflammation, No Lumpectomy scars, No Mastectomy scars, No Peau d' Orange. Note: There is ecchymosis of the left breast from the biopsy and a palpable hematoma adjacent to the areola in the upper inner quadrant of the breast   Cardiovascular Cardiovascular examination reveals -normal heart sounds, regular rate and rhythm with no murmurs and normal pedal pulses bilaterally.  Abdomen Inspection Inspection of the abdomen reveals - No Hernias. Skin - Scar - no surgical scars. Palpation/Percussion Palpation and Percussion of the abdomen reveal - Soft, Non Tender, No Rebound tenderness, No Rigidity (guarding) and No hepatosplenomegaly. Auscultation Auscultation of the abdomen reveals - Bowel sounds normal.  Neurologic - Did not examine.  Musculoskeletal - Did not examine.  Lymphatic Head & Neck  General Head & Neck Lymphatics: Bilateral - Description - Normal. Axillary  General Axillary Region: Bilateral - Description - Normal. Tenderness - Non Tender. Femoral & Inguinal - Did not examine.    Assessment & Plan (Reinaldo Helt A. Magnus Ivan MD; 01/07/2018 11:34 AM) SCLEROSING ADENOSIS OF BREAST, LEFT (N60.22) Impression: This is a patient with a greater than 3 cm area of calcifications in the upper inner quadrant of the left breast with a biopsy showing a complex sclerosing lesion. Because of the size of this area, a radioactive seed guided left breast lumpectomy is recommended with bracketed seeds to completely remove this area for histological evaluation to rule out malignancy. I have discussed this with her thoroughly. She seems to understand the reasoning for surgery. I discussed the surgical procedure with her in detail. I discussed the risks which includes but is not limited to bleeding, infection, injury to surrounding  structures, the need for further surgery if malignancy is found, cardiopulmonary issues, postoperative recovery, etc. She understands and wished to proceed with surgery which will be scheduled

## 2018-01-30 ENCOUNTER — Ambulatory Visit (HOSPITAL_BASED_OUTPATIENT_CLINIC_OR_DEPARTMENT_OTHER): Payer: BLUE CROSS/BLUE SHIELD | Admitting: Anesthesiology

## 2018-01-30 ENCOUNTER — Ambulatory Visit
Admission: RE | Admit: 2018-01-30 | Discharge: 2018-01-30 | Disposition: A | Discharge status: Home / Self Care | Payer: BLUE CROSS/BLUE SHIELD | Source: Ambulatory Visit | Attending: Surgery | Admitting: Surgery

## 2018-01-30 ENCOUNTER — Other Ambulatory Visit: Payer: Self-pay

## 2018-01-30 ENCOUNTER — Ambulatory Visit (HOSPITAL_BASED_OUTPATIENT_CLINIC_OR_DEPARTMENT_OTHER)
Admission: RE | Admit: 2018-01-30 | Discharge: 2018-01-30 | Disposition: A | Discharge status: Home / Self Care | Payer: BLUE CROSS/BLUE SHIELD | Source: Ambulatory Visit | Attending: Surgery | Admitting: Surgery

## 2018-01-30 ENCOUNTER — Encounter (HOSPITAL_BASED_OUTPATIENT_CLINIC_OR_DEPARTMENT_OTHER)
Admission: RE | Disposition: A | Discharge status: Home / Self Care | Payer: Self-pay | Source: Ambulatory Visit | Attending: Surgery

## 2018-01-30 ENCOUNTER — Encounter (HOSPITAL_BASED_OUTPATIENT_CLINIC_OR_DEPARTMENT_OTHER): Payer: Self-pay | Admitting: *Deleted

## 2018-01-30 DIAGNOSIS — Z8041 Family history of malignant neoplasm of ovary: Secondary | ICD-10-CM | POA: Diagnosis not present

## 2018-01-30 DIAGNOSIS — Z79899 Other long term (current) drug therapy: Secondary | ICD-10-CM | POA: Diagnosis not present

## 2018-01-30 DIAGNOSIS — I10 Essential (primary) hypertension: Secondary | ICD-10-CM | POA: Diagnosis not present

## 2018-01-30 DIAGNOSIS — E119 Type 2 diabetes mellitus without complications: Secondary | ICD-10-CM | POA: Insufficient documentation

## 2018-01-30 DIAGNOSIS — R921 Mammographic calcification found on diagnostic imaging of breast: Secondary | ICD-10-CM | POA: Diagnosis not present

## 2018-01-30 DIAGNOSIS — Z7984 Long term (current) use of oral hypoglycemic drugs: Secondary | ICD-10-CM | POA: Diagnosis not present

## 2018-01-30 DIAGNOSIS — N6489 Other specified disorders of breast: Secondary | ICD-10-CM | POA: Insufficient documentation

## 2018-01-30 DIAGNOSIS — K219 Gastro-esophageal reflux disease without esophagitis: Secondary | ICD-10-CM | POA: Diagnosis not present

## 2018-01-30 DIAGNOSIS — N6022 Fibroadenosis of left breast: Secondary | ICD-10-CM

## 2018-01-30 DIAGNOSIS — Z7982 Long term (current) use of aspirin: Secondary | ICD-10-CM | POA: Diagnosis not present

## 2018-01-30 DIAGNOSIS — Z803 Family history of malignant neoplasm of breast: Secondary | ICD-10-CM | POA: Diagnosis not present

## 2018-01-30 DIAGNOSIS — N6012 Diffuse cystic mastopathy of left breast: Secondary | ICD-10-CM | POA: Diagnosis not present

## 2018-01-30 DIAGNOSIS — R928 Other abnormal and inconclusive findings on diagnostic imaging of breast: Secondary | ICD-10-CM | POA: Diagnosis not present

## 2018-01-30 HISTORY — DX: Essential (primary) hypertension: I10

## 2018-01-30 HISTORY — DX: Sickle-cell trait: D57.3

## 2018-01-30 HISTORY — DX: Fibroadenosis of left breast: N60.22

## 2018-01-30 HISTORY — DX: Supraventricular tachycardia: I47.1

## 2018-01-30 HISTORY — DX: Other supraventricular tachycardia: I47.19

## 2018-01-30 HISTORY — DX: Personal history of other diseases of the respiratory system: Z87.09

## 2018-01-30 HISTORY — DX: Carpal tunnel syndrome, bilateral upper limbs: G56.03

## 2018-01-30 HISTORY — DX: Family history of ischemic heart disease and other diseases of the circulatory system: Z82.49

## 2018-01-30 HISTORY — PX: BREAST LUMPECTOMY WITH RADIOACTIVE SEED LOCALIZATION: SHX6424

## 2018-01-30 HISTORY — DX: Gastro-esophageal reflux disease without esophagitis: K21.9

## 2018-01-30 HISTORY — DX: Type 2 diabetes mellitus without complications: E11.9

## 2018-01-30 HISTORY — DX: Personal history of urinary calculi: Z87.442

## 2018-01-30 HISTORY — DX: Hyperlipidemia, unspecified: E78.5

## 2018-01-30 HISTORY — DX: Unspecified cataract: H26.9

## 2018-01-30 LAB — GLUCOSE, CAPILLARY
Glucose-Capillary: 248 mg/dL — ABNORMAL HIGH (ref 65–99)
Glucose-Capillary: 234 mg/dL — ABNORMAL HIGH (ref 65–99)

## 2018-01-30 IMAGING — MG BREAST SURGICAL SPECIMEN
1 series · 1 of 1 positions shown · non-contrast
Comparison: Previous exam(s).

CLINICAL DATA: Specimen radiograph status post left breast
lumpectomy.

EXAM:
SPECIMEN RADIOGRAPH OF THE LEFT BREAST

[L]
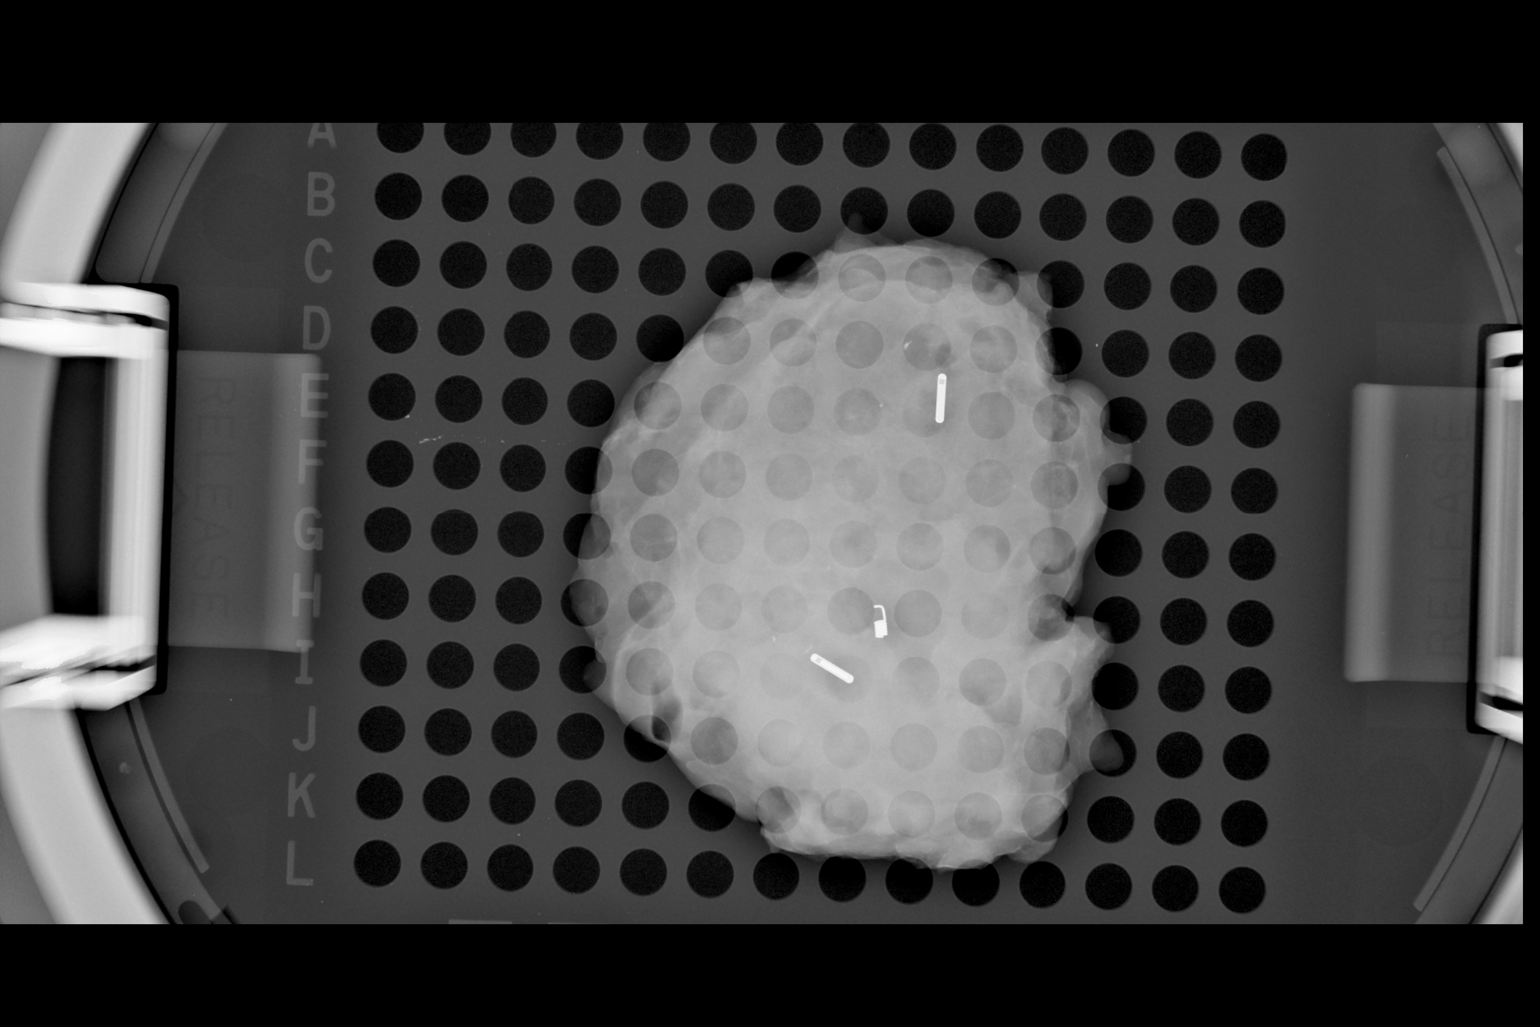

[1 of 1 positions shown; findings below may reference images not displayed]

FINDINGS: Status post excision of the left breast. The two radioactive seeds
and biopsy marker clip are present, completely intact, and were
marked for pathology. These findings were communicated with the OR
at [DATE] a.m..
IMPRESSION: Specimen radiograph of the left breast.

## 2018-01-30 SURGERY — BREAST LUMPECTOMY WITH RADIOACTIVE SEED LOCALIZATION
Anesthesia: General | Site: Breast | Laterality: Left

## 2018-01-30 MED ORDER — DIPHENHYDRAMINE HCL 50 MG/ML IJ SOLN
6.2500 mg | Freq: Once | INTRAMUSCULAR | Status: AC
  Administered 2018-01-30: 6.25 mg via INTRAVENOUS

## 2018-01-30 MED ORDER — LACTATED RINGERS IV SOLN
INTRAVENOUS | Status: DC
  Administered 2018-01-30 (×2): via INTRAVENOUS

## 2018-01-30 MED ORDER — LIDOCAINE HCL (CARDIAC) 20 MG/ML IV SOLN
INTRAVENOUS | Status: DC | PRN
  Administered 2018-01-30: 30 mg via INTRAVENOUS

## 2018-01-30 MED ORDER — ONDANSETRON HCL 4 MG/2ML IJ SOLN
INTRAMUSCULAR | Status: DC | PRN
  Administered 2018-01-30: 4 mg via INTRAVENOUS

## 2018-01-30 MED ORDER — MIDAZOLAM HCL 5 MG/5ML IJ SOLN
INTRAMUSCULAR | Status: DC | PRN
  Administered 2018-01-30: 2 mg via INTRAVENOUS

## 2018-01-30 MED ORDER — GABAPENTIN 300 MG PO CAPS
300.0000 mg | ORAL_CAPSULE | ORAL | Status: AC
  Administered 2018-01-30: 300 mg via ORAL

## 2018-01-30 MED ORDER — FENTANYL CITRATE (PF) 100 MCG/2ML IJ SOLN: 50.0000 ug | INTRAMUSCULAR | Status: DC | PRN

## 2018-01-30 MED ORDER — MORPHINE SULFATE (PF) 2 MG/ML IV SOLN: 2.0000 mg | INTRAVENOUS | Status: DC | PRN

## 2018-01-30 MED ORDER — SODIUM CHLORIDE 0.9% FLUSH: 3.0000 mL | INTRAVENOUS | Status: DC | PRN

## 2018-01-30 MED ORDER — BUPIVACAINE HCL (PF) 0.25 % IJ SOLN
INTRAMUSCULAR | Status: AC
  Filled 2018-01-30: qty 60

## 2018-01-30 MED ORDER — FENTANYL CITRATE (PF) 100 MCG/2ML IJ SOLN
INTRAMUSCULAR | Status: AC
  Filled 2018-01-30: qty 2

## 2018-01-30 MED ORDER — DIPHENHYDRAMINE HCL 50 MG/ML IJ SOLN
INTRAMUSCULAR | Status: AC
  Filled 2018-01-30: qty 1

## 2018-01-30 MED ORDER — CHLORHEXIDINE GLUCONATE CLOTH 2 % EX PADS: 6.0000 | MEDICATED_PAD | Freq: Once | CUTANEOUS | Status: DC

## 2018-01-30 MED ORDER — MIDAZOLAM HCL 2 MG/2ML IJ SOLN
INTRAMUSCULAR | Status: AC
  Filled 2018-01-30: qty 2

## 2018-01-30 MED ORDER — CIPROFLOXACIN IN D5W 400 MG/200ML IV SOLN
INTRAVENOUS | Status: AC
  Filled 2018-01-30: qty 200

## 2018-01-30 MED ORDER — ACETAMINOPHEN 500 MG PO TABS
ORAL_TABLET | ORAL | Status: AC
  Filled 2018-01-30: qty 2

## 2018-01-30 MED ORDER — CIPROFLOXACIN IN D5W 400 MG/200ML IV SOLN
400.0000 mg | INTRAVENOUS | Status: AC
  Administered 2018-01-30: 400 mg via INTRAVENOUS

## 2018-01-30 MED ORDER — OXYCODONE HCL 5 MG PO TABS: 5.0000 mg | ORAL_TABLET | ORAL | Status: DC | PRN

## 2018-01-30 MED ORDER — GABAPENTIN 300 MG PO CAPS
ORAL_CAPSULE | ORAL | Status: AC
  Filled 2018-01-30: qty 1

## 2018-01-30 MED ORDER — BUPIVACAINE-EPINEPHRINE (PF) 0.5% -1:200000 IJ SOLN
INTRAMUSCULAR | Status: AC
  Filled 2018-01-30: qty 60

## 2018-01-30 MED ORDER — ACETAMINOPHEN 500 MG PO TABS
1000.0000 mg | ORAL_TABLET | ORAL | Status: AC
  Administered 2018-01-30: 1000 mg via ORAL

## 2018-01-30 MED ORDER — SODIUM CHLORIDE 0.9 % IV SOLN: 250.0000 mL | INTRAVENOUS | Status: DC | PRN

## 2018-01-30 MED ORDER — DEXAMETHASONE SODIUM PHOSPHATE 4 MG/ML IJ SOLN
INTRAMUSCULAR | Status: DC | PRN
  Administered 2018-01-30: 10 mg via INTRAVENOUS

## 2018-01-30 MED ORDER — ACETAMINOPHEN 650 MG RE SUPP: 650.0000 mg | RECTAL | Status: DC | PRN

## 2018-01-30 MED ORDER — DIPHENHYDRAMINE HCL 50 MG/ML IJ SOLN: 6.2500 mg | Freq: Once | INTRAMUSCULAR | Status: DC

## 2018-01-30 MED ORDER — ACETAMINOPHEN 325 MG PO TABS
650.0000 mg | ORAL_TABLET | ORAL | Status: DC | PRN
Start: 2018-01-30 — End: 2018-01-30

## 2018-01-30 MED ORDER — PROPOFOL 10 MG/ML IV BOLUS
INTRAVENOUS | Status: DC | PRN
  Administered 2018-01-30: 150 mg via INTRAVENOUS

## 2018-01-30 MED ORDER — MIDAZOLAM HCL 2 MG/2ML IJ SOLN: 1.0000 mg | INTRAMUSCULAR | Status: DC | PRN

## 2018-01-30 MED ORDER — FENTANYL CITRATE (PF) 100 MCG/2ML IJ SOLN
INTRAMUSCULAR | Status: DC | PRN
  Administered 2018-01-30: 100 ug via INTRAVENOUS

## 2018-01-30 MED ORDER — BUPIVACAINE-EPINEPHRINE 0.5% -1:200000 IJ SOLN
INTRAMUSCULAR | Status: DC | PRN
  Administered 2018-01-30: 15 mL

## 2018-01-30 MED ORDER — SCOPOLAMINE 1 MG/3DAYS TD PT72: 1.0000 | MEDICATED_PATCH | Freq: Once | TRANSDERMAL | Status: DC | PRN

## 2018-01-30 MED ORDER — SODIUM CHLORIDE 0.9% FLUSH: 3.0000 mL | Freq: Two times a day (BID) | INTRAVENOUS | Status: DC

## 2018-01-30 SURGICAL SUPPLY — 49 items
APPLIER CLIP 9.375 MED OPEN (MISCELLANEOUS) ×2
BINDER BREAST LRG (GAUZE/BANDAGES/DRESSINGS) ×2 IMPLANT
BLADE HEX COATED 2.75 (ELECTRODE) ×2 IMPLANT
BLADE SURG 15 STRL LF DISP TIS (BLADE) ×1 IMPLANT
BLADE SURG 15 STRL SS (BLADE) ×1
CANISTER SUC SOCK COL 7IN (MISCELLANEOUS) IMPLANT
CANISTER SUCT 1200ML W/VALVE (MISCELLANEOUS) IMPLANT
CHLORAPREP W/TINT 26ML (MISCELLANEOUS) ×2 IMPLANT
CLIP APPLIE 9.375 MED OPEN (MISCELLANEOUS) ×1 IMPLANT
CLIP VESOCCLUDE SM WIDE 6/CT (CLIP) IMPLANT
COVER BACK TABLE 60X90IN (DRAPES) ×2 IMPLANT
COVER MAYO STAND STRL (DRAPES) ×2 IMPLANT
COVER PROBE W GEL 5X96 (DRAPES) ×2 IMPLANT
DECANTER SPIKE VIAL GLASS SM (MISCELLANEOUS) IMPLANT
DERMABOND ADVANCED (GAUZE/BANDAGES/DRESSINGS) ×1
DERMABOND ADVANCED .7 DNX12 (GAUZE/BANDAGES/DRESSINGS) ×1 IMPLANT
DEVICE DUBIN W/COMP PLATE 8390 (MISCELLANEOUS) ×2 IMPLANT
DRAPE LAPAROSCOPIC ABDOMINAL (DRAPES) ×2 IMPLANT
DRAPE UTILITY XL STRL (DRAPES) ×2 IMPLANT
ELECT REM PT RETURN 9FT ADLT (ELECTROSURGICAL) ×2
ELECTRODE REM PT RTRN 9FT ADLT (ELECTROSURGICAL) ×1 IMPLANT
GAUZE SPONGE 4X4 12PLY STRL LF (GAUZE/BANDAGES/DRESSINGS) IMPLANT
GLOVE BIOGEL PI IND STRL 7.0 (GLOVE) ×2 IMPLANT
GLOVE BIOGEL PI IND STRL 7.5 (GLOVE) ×1 IMPLANT
GLOVE BIOGEL PI INDICATOR 7.0 (GLOVE) ×2
GLOVE BIOGEL PI INDICATOR 7.5 (GLOVE) ×1
GLOVE SURG SIGNA 7.5 PF LTX (GLOVE) ×2 IMPLANT
GLOVE SURG SS PI 6.5 STRL IVOR (GLOVE) ×2 IMPLANT
GLOVE SURG SS PI 7.0 STRL IVOR (GLOVE) ×2 IMPLANT
GOWN STRL REUS W/ TWL LRG LVL3 (GOWN DISPOSABLE) ×1 IMPLANT
GOWN STRL REUS W/ TWL XL LVL3 (GOWN DISPOSABLE) ×1 IMPLANT
GOWN STRL REUS W/TWL LRG LVL3 (GOWN DISPOSABLE) ×1
GOWN STRL REUS W/TWL XL LVL3 (GOWN DISPOSABLE) ×1
KIT MARKER MARGIN INK (KITS) ×2 IMPLANT
NEEDLE HYPO 25X1 1.5 SAFETY (NEEDLE) ×2 IMPLANT
NS IRRIG 1000ML POUR BTL (IV SOLUTION) IMPLANT
PACK BASIN DAY SURGERY FS (CUSTOM PROCEDURE TRAY) ×2 IMPLANT
PENCIL BUTTON HOLSTER BLD 10FT (ELECTRODE) ×2 IMPLANT
SLEEVE SCD COMPRESS KNEE MED (MISCELLANEOUS) ×2 IMPLANT
SPONGE LAP 4X18 X RAY DECT (DISPOSABLE) ×2 IMPLANT
SUT MNCRL AB 4-0 PS2 18 (SUTURE) ×2 IMPLANT
SUT SILK 2 0 SH (SUTURE) IMPLANT
SUT VIC AB 3-0 SH 27 (SUTURE) ×1
SUT VIC AB 3-0 SH 27X BRD (SUTURE) ×1 IMPLANT
SYR CONTROL 10ML LL (SYRINGE) ×2 IMPLANT
TOWEL OR 17X24 6PK STRL BLUE (TOWEL DISPOSABLE) ×2 IMPLANT
TOWEL OR NON WOVEN STRL DISP B (DISPOSABLE) IMPLANT
TUBE CONNECTING 20X1/4 (TUBING) IMPLANT
YANKAUER SUCT BULB TIP NO VENT (SUCTIONS) IMPLANT

## 2018-01-30 NOTE — Discharge Instructions (Signed)
[facility] °Office Phone Number [phone] ° °BREAST BIOPSY/ PARTIAL MASTECTOMY: POST OP INSTRUCTIONS ° °Always review your discharge instruction sheet given to you by the facility where your surgery was performed. ° °IF YOU HAVE DISABILITY OR FAMILY LEAVE FORMS, YOU MUST BRING THEM TO THE OFFICE FOR PROCESSING.  DO NOT GIVE THEM TO YOUR DOCTOR. ° °1. A prescription for pain medication may be given to you upon discharge.  Take your pain medication as prescribed, if needed.  If narcotic pain medicine is not needed, then you may take acetaminophen (Tylenol) or ibuprofen (Advil) as needed. °2. Take your usually prescribed medications unless otherwise directed °3. If you need a refill on your pain medication, please contact your pharmacy.  They will contact our office to request authorization.  Prescriptions will not be filled after 5pm or on week-ends. °4. You should eat very light the first 24 hours after surgery, such as soup, crackers, pudding, etc.  Resume your normal diet the day after surgery. °5. Most patients will experience some swelling and bruising in the breast.  Ice packs and a good support bra will help.  Swelling and bruising can take several days to resolve.  °6. It is common to experience some constipation if taking pain medication after surgery.  Increasing fluid intake and taking a stool softener will usually help or prevent this problem from occurring.  A mild laxative (Milk of Magnesia or Miralax) should be taken according to package directions if there are no bowel movements after 48 hours. °7. Unless discharge instructions indicate otherwise, you may remove your bandages 24-48 hours after surgery, and you may shower at that time.  You may have steri-strips (small skin tapes) in place directly over the incision.  These strips should be left on the skin for 7-10 days.  If your surgeon used skin glue on the incision, you may shower in 24 hours.  The glue will flake off over the  next 2-3 weeks.  Any sutures or staples will be removed at the office during your follow-up visit. °8. ACTIVITIES:  You may resume regular daily activities (gradually increasing) beginning the next day.  Wearing a good support bra or sports bra minimizes pain and swelling.  You may have sexual intercourse when it is comfortable. °a. You may drive when you no longer are taking prescription pain medication, you can comfortably wear a seatbelt, and you can safely maneuver your car and apply brakes. °b. RETURN TO WORK:  ______________________________________________________________________________________ °9. You should see your doctor in the office for a follow-up appointment approximately two weeks after your surgery.  Your doctor’s nurse will typically make your follow-up appointment when she calls you with your pathology report.  Expect your pathology report 2-3 business days after your surgery.  You may call to check if you do not hear from us after three days. °10. OTHER INSTRUCTIONS: _______________________________________________________________________________________________ _____________________________________________________________________________________________________________________________________ °_____________________________________________________________________________________________________________________________________ °_____________________________________________________________________________________________________________________________________ ° °WHEN TO CALL YOUR DOCTOR: °1. Fever over 101.0 °2. Nausea and/or vomiting. °3. Extreme swelling or bruising. °4. Continued bleeding from incision. °5. Increased pain, redness, or drainage from the incision. ° °The clinic staff is available to answer your questions during regular business hours.  Please don’t hesitate to call and ask to speak to one of the nurses for clinical concerns.  If you have a medical emergency, go to the nearest  emergency room or call 911.  A surgeon from [facility] is always on call at the hospital. ° °For further questions, please visit [email or web address]  ° ° ° ° °  Post Anesthesia Home Care Instructions ° °Activity: °Get plenty of rest for the remainder of the day. A responsible individual must stay with you for 24 hours following the procedure.  °For the next 24 hours, DO NOT: °-Drive a car °-Operate machinery °-Drink alcoholic beverages °-Take any medication unless instructed by your physician °-Make any legal decisions or sign important papers. ° °Meals: °Start with liquid foods such as gelatin or soup. Progress to regular foods as tolerated. Avoid greasy, spicy, heavy foods. If nausea and/or vomiting occur, drink only clear liquids until the nausea and/or vomiting subsides. Call your physician if vomiting continues. ° °Special Instructions/Symptoms: °Your throat may feel dry or sore from the anesthesia or the breathing tube placed in your throat during surgery. If this causes discomfort, gargle with warm salt water. The discomfort should disappear within 24 hours. ° °If you had a scopolamine patch placed behind your ear for the management of post- operative nausea and/or vomiting: ° °1. The medication in the patch is effective for 72 hours, after which it should be removed.  Wrap patch in a tissue and discard in the trash. Wash hands thoroughly with soap and water. °2. You may remove the patch earlier than 72 hours if you experience unpleasant side effects which may include dry mouth, dizziness or visual disturbances. °3. Avoid touching the patch. Wash your hands with soap and water after contact with the patch. °  ° °

## 2018-01-30 NOTE — Interval H&P Note (Signed)
History and Physical Interval Note:no change in H and P  01/30/2018 7:09 AM  Jennifer RaveFrances B [name]  has presented today for surgery, with the diagnosis of COMPLEX SCLEROSING LESION  The various methods of treatment have been discussed with the patient and family. After consideration of risks, benefits and other options for treatment, the patient has consented to  Procedure(s): LEFT BREAST BRACKETED LUMPECTOMY WITH RADIOACTIVE SEED LOCALIZATION (Left) as a surgical intervention .  The patient's history has been reviewed, patient examined, no change in status, stable for surgery.  I have reviewed the patient's chart and labs.  Questions were answered to the patient's satisfaction.     Corneshia Hines A

## 2018-01-30 NOTE — Op Note (Addendum)
LEFT BREAST BRACKETED PARTIAL MASTECTOMY WITH RADIOACTIVE SEED LOCALIZATION  Procedure Note  Jennifer [name] 01/30/2018   Pre-op Diagnosis: COMPLEX SCLEROSING LESION LEFT BREAST     Post-op Diagnosis: SAME  Procedure(s): LEFT BREAST BRACKETED PARTIAL MASTECTOMY WITH RADIOACTIVE SEED LOCALIZATION  Surgeon(s): Abigail MiyamotoBlackman, Aldair Rickel, MD  Anesthesia: General  Staff:  Circulator: Raliegh ScarletBurroughs, Judy G, RN Scrub Person: Julieta BelliniHaralam, Jamie S, RN  Estimated Blood Loss: Minimal               Specimens: sent to path  Indications: This is [name] 67 year old female with [name] complex sclerosing lesion of the left breast as well as multiple calcifications comprising approximately [name] 3-1/2-4 cm area of the upper inner quadrant to 12 o'clock position of the left breast.  [name] bracketed radioactive seed guided partial mastectomy has been recommended  Procedure: The patient had the radioactive seeds placed several days prior to surgery [facility] [place] [facility] [place].  Today she was taken in [name] supine position to the operating room.  She was identified as the correct patient.  General anesthesia was then induced.  Her left breast was then prepped and draped in the usual sterile fashion.  Using the neoprobe, I identified both seeds which were at the 12:00 to 11 o'clock position of the left breast near the areola and moving superiorly.  I anesthetized the skin around the superior edge of the areola with Marcaine.  I then made an incision with [name] scalpel.  I then performed [name] partial mastectomy with the aid of the neoprobe staying widely around both radioactive seeds going down to the chest wall.  There was [name] hematoma present in the tissue.  Once the the entire specimen was removed including both seeds, I again confirmed that both these were in the specimen with the neoprobe.  All margins were then marked with paint.  The specimen was then x-rayed and was confirmed both seeds and the previous marker placed for biopsy purposes was in  the partial mastectomy specimen.  This was then sent to pathology for evaluation.  I then achieved hemostasis with the cautery.  I anesthetized the incision further with Marcaine.  I then closed the subtenons tissue and deep tissue with interrupted 3-0 Vicryl sutures and closed the skin with [name] running 4-0 Monocryl.  Dermabond was then applied.  The patient tolerated the procedure well.  All the counts were correct at the end of the procedure.  The patient was then extubated in the operating room and taken in [name] stable condition to the recovery room.          Jennifer [name]   Date: 01/30/2018  Time: 8:02 AM

## 2018-01-30 NOTE — Anesthesia Postprocedure Evaluation (Signed)
Anesthesia Post Note  Patient: Jennifer [name]  Procedure(s) Performed: LEFT BREAST BRACKETED LUMPECTOMY WITH RADIOACTIVE SEED LOCALIZATION (Left Breast)     Patient location during evaluation: PACU Anesthesia Type: General Level of consciousness: awake and alert Pain management: pain level controlled Vital Signs Assessment: post-procedure vital signs reviewed and stable Respiratory status: spontaneous breathing, nonlabored ventilation, respiratory function stable and patient connected to nasal cannula oxygen Cardiovascular status: blood pressure returned to baseline and stable Postop Assessment: no apparent nausea or vomiting Anesthetic complications: no    Last Vitals:  Vitals:   01/30/18 0900 01/30/18 0924  BP: (!) 148/91 (!) 159/78  Pulse: 81 80  Resp: 14 20  Temp:  36.5 C  SpO2: 98% 97%    Last Pain:  Vitals:   01/30/18 0924  TempSrc:   PainSc: 0-No pain                 Trevor IhaStephen A Houser

## 2018-01-30 NOTE — Anesthesia Procedure Notes (Signed)
Procedure Name: LMA Insertion Date/Time: 01/30/2018 7:26 AM Performed by: [place] DesanctisLinka, Todd Argabright L, CRNA Pre-anesthesia Checklist: Patient identified, Emergency Drugs available, Suction available, Patient being monitored and Timeout performed Patient Re-evaluated:Patient Re-evaluated prior to induction Oxygen Delivery Method: Circle system utilized Preoxygenation: Pre-oxygenation with 100% oxygen Induction Type: IV induction Ventilation: Mask ventilation without difficulty LMA: LMA inserted LMA Size: 4.0 Number of attempts: 1 Airway Equipment and Method: Bite block Placement Confirmation: positive ETCO2 Tube secured with: Tape Dental Injury: Teeth and Oropharynx as per pre-operative assessment

## 2018-01-30 NOTE — Transfer of Care (Signed)
Immediate Anesthesia Transfer of Care Note  Patient: Jennifer [name]  Procedure(s) Performed: LEFT BREAST BRACKETED LUMPECTOMY WITH RADIOACTIVE SEED LOCALIZATION (Left Breast)  Patient Location: PACU  Anesthesia Type:General  Level of Consciousness: awake and patient cooperative  Airway & Oxygen Therapy: Patient Spontanous Breathing and Patient connected to face mask oxygen  Post-op Assessment: Report given to RN and Post -op Vital signs reviewed and stable  Post vital signs: Reviewed and stable  Last Vitals:  Vitals:   01/30/18 0643  BP: 130/67  Pulse: 95  Resp: 18  Temp: 36.8 C  SpO2: 98%    Last Pain:  Vitals:   01/30/18 0643  TempSrc: Oral  PainSc: 0-No pain      Patients Stated Pain Goal: 0 (01/30/18 [id number])  Complications: No apparent anesthesia complications

## 2018-01-30 NOTE — Progress Notes (Signed)
Dr. Hart RochesterHollis ordered Benadryl 6.25 mg IV now for itching - put order in twice for 6.25mg  but shows up 6.5mg . Benadryl 6.25 mg IV given.

## 2018-01-31 ENCOUNTER — Encounter (HOSPITAL_BASED_OUTPATIENT_CLINIC_OR_DEPARTMENT_OTHER): Payer: Self-pay | Admitting: Surgery

## 2018-02-27 DIAGNOSIS — I10 Essential (primary) hypertension: Secondary | ICD-10-CM | POA: Diagnosis not present

## 2018-02-27 DIAGNOSIS — J309 Allergic rhinitis, unspecified: Secondary | ICD-10-CM | POA: Diagnosis not present

## 2018-02-27 DIAGNOSIS — E7800 Pure hypercholesterolemia, unspecified: Secondary | ICD-10-CM | POA: Diagnosis not present

## 2018-02-27 DIAGNOSIS — K219 Gastro-esophageal reflux disease without esophagitis: Secondary | ICD-10-CM | POA: Diagnosis not present

## 2018-02-27 DIAGNOSIS — E119 Type 2 diabetes mellitus without complications: Secondary | ICD-10-CM | POA: Diagnosis not present

## 2018-05-01 DIAGNOSIS — J069 Acute upper respiratory infection, unspecified: Secondary | ICD-10-CM | POA: Diagnosis not present

## 2018-05-20 DIAGNOSIS — R062 Wheezing: Secondary | ICD-10-CM | POA: Diagnosis not present

## 2018-05-20 DIAGNOSIS — H9209 Otalgia, unspecified ear: Secondary | ICD-10-CM | POA: Diagnosis not present

## 2018-05-20 DIAGNOSIS — E119 Type 2 diabetes mellitus without complications: Secondary | ICD-10-CM | POA: Diagnosis not present

## 2019-01-30 DIAGNOSIS — H6980 Other specified disorders of Eustachian tube, unspecified ear: Secondary | ICD-10-CM | POA: Diagnosis not present

## 2019-03-03 DIAGNOSIS — H9041 Sensorineural hearing loss, unilateral, right ear, with unrestricted hearing on the contralateral side: Secondary | ICD-10-CM | POA: Diagnosis not present

## 2019-03-03 DIAGNOSIS — H7311 Chronic myringitis, right ear: Secondary | ICD-10-CM | POA: Diagnosis not present

## 2019-03-03 DIAGNOSIS — E119 Type 2 diabetes mellitus without complications: Secondary | ICD-10-CM | POA: Diagnosis not present

## 2019-03-03 DIAGNOSIS — H60593 Other noninfective acute otitis externa, bilateral: Secondary | ICD-10-CM | POA: Diagnosis not present

## 2019-03-03 DIAGNOSIS — M542 Cervicalgia: Secondary | ICD-10-CM | POA: Diagnosis not present

## 2019-04-07 DIAGNOSIS — E7800 Pure hypercholesterolemia, unspecified: Secondary | ICD-10-CM | POA: Diagnosis not present

## 2019-04-07 DIAGNOSIS — I10 Essential (primary) hypertension: Secondary | ICD-10-CM | POA: Diagnosis not present

## 2019-04-07 DIAGNOSIS — E559 Vitamin D deficiency, unspecified: Secondary | ICD-10-CM | POA: Diagnosis not present

## 2019-04-07 DIAGNOSIS — E1169 Type 2 diabetes mellitus with other specified complication: Secondary | ICD-10-CM | POA: Diagnosis not present

## 2019-04-14 ENCOUNTER — Other Ambulatory Visit: Payer: Self-pay

## 2019-04-14 ENCOUNTER — Other Ambulatory Visit: Payer: Self-pay | Admitting: Pharmacist

## 2019-04-14 NOTE — Patient Outreach (Addendum)
  [facility]) Care Management Chronic Special Needs Program  04/14/2019  Name: Jennifer [name] DOB: 04/17/1951  MRN: [id number]  Jennifer [name] is enrolled in a chronic special needs plan for Diabetes. Chronic Care Management Coordinator telephoned client to review health risk assessment and to develop individualized care plan.  Introduced the chronic care management program, importance of client participation, and taking their care plan to all provider appointments and inpatient facilities. COVID-19 precautions discussed.   Subjective: Client reports history of diabetes and hypertension. She states that she is in the "donut hole", but currently is able to get her medications, but reports the cost of Trulicity is high. Client is agreeable to pharmacy referral. She denies any issues or concerns at this time. Personal goal: to loose weight.  Goals Addressed            This Visit's Progress   . Client understands the importance of follow-up with providers by attending scheduled visits      . Client will report abillity to obtain Medications within the next 3 months.      . Client will use Assistive Devices as needed and verbalize understanding of device use      . Client will verbalize knowledge of self management of Hypertension as evidences by BP reading of 140/90 or less; or as defined by provider      . HEMOGLOBIN A1C < 7.0      . Maintain timely refills of diabetic medication as prescribed within the year .      Marland Kitchen Obtain annual  Lipid Profile, LDL-C      . Obtain Annual Eye (retinal)  Exam       . Obtain Annual Foot Exam      . Obtain annual screen for micro albuminuria (urine) , nephropathy (kidney problems)      . Obtain Hemoglobin A1C at least 2 times per year      . Patient stated goal:  loose at least 10 pounds within the next 6 months. (pt-stated)       Be active at least once a week, and increase as tolerated. Contact health care concierge re: Silver  Sneakers "Livestream" classes.     . Visit Primary Care Provider or Endocrinologist at least 2 times per year         RNCM discussed access to 24 hour nurse advice line; health care concierge for benefits questions and client encouraged to call RNCM as needed.  Plan:  Send successful outreach letter with a copy of their individualized care plan, Send individual care plan to provider and Send educational material Chronic care management coordinati   will outreach in: 6 months. Will refer client to:  Pharmacy   Kathyrn Sheriff, RN, MSN, [facility] Chronic Care Management Coordinator [facility] [phone]

## 2019-04-14 NOTE — Patient Outreach (Signed)
[facility]) Care Management  [facility]   04/14/2019  Jennifer [name] 18-Dec-1951 [id number]  Reason for referral: Medication Review / Medication Assistance  Referral source: [facility] Care Management RN with [employer] C-SNP Current insurance: [employer] C-SNP  PMHx includes but not limited to:  HTN, HLD, T2DM, hx PSVT  Outreach:  Successful telephone call with Ms. Jennifer [name].  HIPAA identifiers verified.   Subjective:  Patient reports using she manages her own medications and does not have any concerns with medications at this time.  She reports she is able to pay all co-pays and does not wish to review any patient assistance programs.  She is agreeable to "quickly" review medications.   Objective: Lab Results  Component Value Date   CREATININE 0.65 01/27/2018   CREATININE 0.80 01/17/2015    No results found for: HGBA1C  Lipid Panel  No results found for: CHOL, TRIG, HDL, CHOLHDL, VLDL, LDLCALC, LDLDIRECT  BP Readings from Last 3 Encounters:  01/30/18 (!) 159/78  07/27/14 (!) 150/89    Allergies  Allergen Reactions  . Accupril [Quinapril Hcl] Other (See Comments) and Cough    NUMBNESS AND TINGLING  . Penicillins Hives  . Cephalosporins Hives and Itching  . Nitroglycerin Rash    PATCH    Medications Reviewed Today    Reviewed by Colletta Maryland, RN (Registered Nurse) on 04/14/19 at 1249  Med List Status: <None>  Medication Order Taking? Sig Documenting Provider Last Dose Status Informant  aspirin EC 81 MG tablet [id number] Yes Take 81 mg by mouth daily. [provider] Taking Active   celecoxib (CELEBREX) 200 MG capsule [id number] No Take 200 mg by mouth 2 (two) times daily. [provider] Not Taking Active   Cholecalciferol (VITAMIN D) 400 UNIT/ML LIQD [id number] No Take by mouth. 3 DROPS 3 TIMES/WEEK - MON/WED/FRI [provider] Not Taking Active   Dulaglutide (TRULICITY) 1.5 MG/0.5ML  SOPN [id number] Yes Inject into the skin once a week. TUESDAY [provider] Taking Active   glyBURIDE (DIABETA) 1.25 MG tablet [id number] Yes Take 4 mg by mouth daily with breakfast. [provider] Taking Active            Med Note Earlene Plater, [place]   Tue Apr 14, 2019 12:48 PM) Reports taking 4mg  in the morning and 2mg  in the evening.  glyBURIDE (DIABETA) 1.25 MG tablet [id number]  Take 1 mg by mouth daily after supper. [provider]  Active   lansoprazole (PREVACID) 30 MG capsule [id number] Yes Take 30 mg by mouth daily before supper.  [provider] Taking Active   metFORMIN (GLUCOPHAGE) 1000 MG tablet [id number] Yes Take 1 tablet by mouth 2 (two) times daily. [provider] Taking Active            Med Note Kathrynn Running, Gilberto Better   Tue Jul 27, 2014  1:29 PM) Received from: External Pharmacy Received Sig:   Multiple Vitamins-Minerals (CENTRUM) tablet [id number] Yes Take 1 tablet by mouth daily. [provider] Taking Active   rosuvastatin (CRESTOR) 20 MG tablet [id number] Yes Take 20 mg by mouth 3 (three) times a week. [provider] Taking Active   sitaGLIPtin (JANUVIA) 100 MG tablet [id number] Yes Take 100 mg by mouth daily. [provider] Taking Active   telmisartan-hydrochlorothiazide (MICARDIS HCT) 80-12.5 MG tablet [id number] Yes Take 1 tablet by mouth daily. [provider] Taking Active   verapamil (CALAN-SR) 240 MG CR tablet [id number] Yes Take  1 tablet by mouth daily. [provider] Taking Active            Med Note Earlene Plater(WALLACE, Garnett FarmJUANA M   Tue Apr 14, 2019 12:49 PM) Reports can only take a special formula - Verapamil SR 240mg  capsul by Actavis from [employer].           Assessment:  Drugs sorted by system:  Cardiovascular: aspirin 81mg , carvedilol, rosuvastatin, telmisartan-HCTZ, verapamil   Pulmonary/Allergy: albuterol inhaler, fexofenadine / claritin  Gastrointestinal: lasoprazole  Endocrine:  dulaglutide, glimepiride, metformin, sitagliptin  Vitamins/Minerals/Supplements: MVI   Medication Adherence Findings: Adherence Review  [x]  Excellent (no doses missed/week)     []  Good (no more than 1 dose missed/week)     []  Partial (2-3 doses missed/week) []  Poor (>3 doses missed/week)  Patient with excellent understanding of regimen and excellent understanding of indications.    Medication Assistance Findings:  No medication assistance needs identified  Patient denies needing medication assistance at this time.  I provided patient with my contact information if she would like to reach out to me in the future.    Plan: . Will [facility] as no further medication needs identified at this time.  Am happy to assist in the future as needed.     Haynes Hoehnolleen Alexi Geibel, PharmD, [facility] Clinical Pharmacist [facility] [phone]

## 2019-05-19 DIAGNOSIS — E7800 Pure hypercholesterolemia, unspecified: Secondary | ICD-10-CM | POA: Diagnosis not present

## 2019-05-19 DIAGNOSIS — E1169 Type 2 diabetes mellitus with other specified complication: Secondary | ICD-10-CM | POA: Diagnosis not present

## 2019-05-19 DIAGNOSIS — I10 Essential (primary) hypertension: Secondary | ICD-10-CM | POA: Diagnosis not present

## 2019-05-19 DIAGNOSIS — E559 Vitamin D deficiency, unspecified: Secondary | ICD-10-CM | POA: Diagnosis not present

## 2019-07-15 DIAGNOSIS — M25562 Pain in left knee: Secondary | ICD-10-CM | POA: Diagnosis not present

## 2019-07-15 DIAGNOSIS — M545 Low back pain: Secondary | ICD-10-CM | POA: Diagnosis not present

## 2019-07-15 DIAGNOSIS — M542 Cervicalgia: Secondary | ICD-10-CM | POA: Diagnosis not present

## 2019-07-22 DIAGNOSIS — M545 Low back pain: Secondary | ICD-10-CM | POA: Diagnosis not present

## 2019-07-22 DIAGNOSIS — M6281 Muscle weakness (generalized): Secondary | ICD-10-CM | POA: Diagnosis not present

## 2019-07-22 DIAGNOSIS — M2560 Stiffness of unspecified joint, not elsewhere classified: Secondary | ICD-10-CM | POA: Diagnosis not present

## 2019-07-22 DIAGNOSIS — M542 Cervicalgia: Secondary | ICD-10-CM | POA: Diagnosis not present

## 2019-07-29 DIAGNOSIS — M545 Low back pain: Secondary | ICD-10-CM | POA: Diagnosis not present

## 2019-07-29 DIAGNOSIS — M6281 Muscle weakness (generalized): Secondary | ICD-10-CM | POA: Diagnosis not present

## 2019-07-29 DIAGNOSIS — M542 Cervicalgia: Secondary | ICD-10-CM | POA: Diagnosis not present

## 2019-07-29 DIAGNOSIS — M2560 Stiffness of unspecified joint, not elsewhere classified: Secondary | ICD-10-CM | POA: Diagnosis not present

## 2019-08-03 DIAGNOSIS — M542 Cervicalgia: Secondary | ICD-10-CM | POA: Diagnosis not present

## 2019-08-03 DIAGNOSIS — M2560 Stiffness of unspecified joint, not elsewhere classified: Secondary | ICD-10-CM | POA: Diagnosis not present

## 2019-08-03 DIAGNOSIS — M6281 Muscle weakness (generalized): Secondary | ICD-10-CM | POA: Diagnosis not present

## 2019-08-03 DIAGNOSIS — M545 Low back pain: Secondary | ICD-10-CM | POA: Diagnosis not present

## 2019-08-05 DIAGNOSIS — E1169 Type 2 diabetes mellitus with other specified complication: Secondary | ICD-10-CM | POA: Diagnosis not present

## 2019-08-05 DIAGNOSIS — K5900 Constipation, unspecified: Secondary | ICD-10-CM | POA: Diagnosis not present

## 2019-08-05 DIAGNOSIS — M542 Cervicalgia: Secondary | ICD-10-CM | POA: Diagnosis not present

## 2019-08-05 DIAGNOSIS — I10 Essential (primary) hypertension: Secondary | ICD-10-CM | POA: Diagnosis not present

## 2019-08-05 DIAGNOSIS — M2560 Stiffness of unspecified joint, not elsewhere classified: Secondary | ICD-10-CM | POA: Diagnosis not present

## 2019-08-05 DIAGNOSIS — M6281 Muscle weakness (generalized): Secondary | ICD-10-CM | POA: Diagnosis not present

## 2019-08-05 DIAGNOSIS — M791 Myalgia, unspecified site: Secondary | ICD-10-CM | POA: Diagnosis not present

## 2019-08-05 DIAGNOSIS — M545 Low back pain: Secondary | ICD-10-CM | POA: Diagnosis not present

## 2019-08-12 DIAGNOSIS — M542 Cervicalgia: Secondary | ICD-10-CM | POA: Diagnosis not present

## 2019-08-12 DIAGNOSIS — M545 Low back pain: Secondary | ICD-10-CM | POA: Diagnosis not present

## 2019-08-12 DIAGNOSIS — M6281 Muscle weakness (generalized): Secondary | ICD-10-CM | POA: Diagnosis not present

## 2019-08-12 DIAGNOSIS — M2560 Stiffness of unspecified joint, not elsewhere classified: Secondary | ICD-10-CM | POA: Diagnosis not present

## 2019-08-17 DIAGNOSIS — M6281 Muscle weakness (generalized): Secondary | ICD-10-CM | POA: Diagnosis not present

## 2019-08-17 DIAGNOSIS — M2560 Stiffness of unspecified joint, not elsewhere classified: Secondary | ICD-10-CM | POA: Diagnosis not present

## 2019-08-17 DIAGNOSIS — M542 Cervicalgia: Secondary | ICD-10-CM | POA: Diagnosis not present

## 2019-08-17 DIAGNOSIS — M545 Low back pain: Secondary | ICD-10-CM | POA: Diagnosis not present

## 2019-08-19 DIAGNOSIS — M542 Cervicalgia: Secondary | ICD-10-CM | POA: Diagnosis not present

## 2019-08-19 DIAGNOSIS — M6281 Muscle weakness (generalized): Secondary | ICD-10-CM | POA: Diagnosis not present

## 2019-08-19 DIAGNOSIS — M2560 Stiffness of unspecified joint, not elsewhere classified: Secondary | ICD-10-CM | POA: Diagnosis not present

## 2019-08-19 DIAGNOSIS — M545 Low back pain: Secondary | ICD-10-CM | POA: Diagnosis not present

## 2019-08-24 DIAGNOSIS — M542 Cervicalgia: Secondary | ICD-10-CM | POA: Diagnosis not present

## 2019-08-24 DIAGNOSIS — M545 Low back pain: Secondary | ICD-10-CM | POA: Diagnosis not present

## 2019-08-24 DIAGNOSIS — M2560 Stiffness of unspecified joint, not elsewhere classified: Secondary | ICD-10-CM | POA: Diagnosis not present

## 2019-08-24 DIAGNOSIS — M6281 Muscle weakness (generalized): Secondary | ICD-10-CM | POA: Diagnosis not present

## 2019-09-12 DIAGNOSIS — J0100 Acute maxillary sinusitis, unspecified: Secondary | ICD-10-CM | POA: Diagnosis not present

## 2019-09-22 ENCOUNTER — Telehealth: Payer: Self-pay

## 2019-09-29 ENCOUNTER — Other Ambulatory Visit: Payer: Self-pay

## 2019-09-29 NOTE — Patient Outreach (Signed)
[facility]) Care Management Chronic Special Needs Program  09/29/2019  Name: Jennifer [name] DOB: 17-May-1951  MRN: [id number]  Jennifer [name] is enrolled in a chronic special needs plan for Diabetes. Reviewed and updated care plan.  Subjective: client reports she is doing better. She reports she is in the donut hole and the cost of her Shearon Stalls is $300 for a 3 month supply. She reports she has her medication and is going to pick up a supply today. Jennifer [name] reports she had to send her husband to a skilled facility, but is able to communicate with him. She states she has been under a lot of stress lately, but states it has improved.. Client is receptive to a social work referral for resources and [facility] pharmacy referral for medication assistance. A1C on 05/19/2019 was 5.7. Client with question about in-network providers specialist.    Goals Addressed            This Visit's Progress   . COMPLETED: Client understands the importance of follow-up with providers by attending scheduled visits       Voiced understanding of importance    . COMPLETED: Client will report abillity to obtain Medications within the next 3 months.       Reports ability to obtain medications.    . COMPLETED: Client will use Assistive Devices as needed and verbalize understanding of device use       Denies issues with use of glucometer.    . COMPLETED: Client will verbalize knowledge of self management of Hypertension as evidences by BP reading of 140/90 or less; or as defined by provider       Reports takes medications as scheduled; monitor salt intake, decrease stress, follow up with provider as scheduled, intermittent blood pressure checks. Reports blood pressure less than 140/80.    . Maintain timely refills of diabetic medication as prescribed within the year .   On track   . COMPLETED: Obtain annual  Lipid Profile, LDL-C       Done 05/19/2019    . Obtain Annual Eye (retinal)  Exam     On track   . COMPLETED: Obtain Annual Foot Exam       Per client, reports done 2 months ago.    . COMPLETED: Obtain annual screen for micro albuminuria (urine) , nephropathy (kidney problems)       Done 05/19/2019    . COMPLETED: Obtain Hemoglobin A1C at least 2 times per year       Per client done two times this year.    . COMPLETED: Patient stated goal:  loose at least 10 pounds within the next 6 months. (pt-stated)       Client reports has lost around 20 pounds.    . COMPLETED: Visit Primary Care Provider or Endocrinologist at least 2 times per year        Reports has seen provider at least 2 times this year.      Covid-19 precautions discussed. RNCM encouraged client to call 24 hour nurse advice line as needed; reiterated the health care concierge for benefits questions and encouraged client to call RNCM as needed.   Plan: send updated care plan to client; send updated care plan to primary care. Send referral to [place] and social work. RNCM will outreach telephonically to client within the next 4-6 months. RNCM sent healthcare concierge a request to contact client about in-network provider.     Thea Silversmith, RN, MSN, [facility] Chronic Care  Management Coordinator [facility] [phone]

## 2019-09-30 ENCOUNTER — Other Ambulatory Visit: Payer: Self-pay | Admitting: Pharmacist

## 2019-09-30 NOTE — Patient Outreach (Signed)
[place] [facility]) Care Management  [employer]   09/30/2019  Jennifer [name] [birth year] [id number]  Reason for referral: Medication Assistance  Referral source: [employer] C-SNP Care Manager with [facility] Current insurance: [employer]vantage C-SNP  PMHx includes but not limited to:  HTN, HLD, T2DM, hx PSVT  Outreach:  Successful telephone call with patient.  HIPAA identifiers verified. Patient reports she is driving right now and requests that I call her back around 2:00PM.    2:00PM Successful call with patient.  She declines a medication review and states she only wants to discuss if she is eligible to for patient assistance as she is in the coverage gap.  She states she is going to pick up Trulicity 90 day supply this weekend and co-pay is $300.   She also confirms that Januvia has a high co-pay as this is a Tier 3 medication as well.   Objective: The ASCVD Risk score Mikey Bussing DC Jr., et al., 2013) failed to calculate for the following reasons:   Cannot find a previous HDL lab   Cannot find a previous total cholesterol lab  Lab Results  Component Value Date   CREATININE 0.65 01/27/2018   CREATININE 0.80 01/17/2015    No results found for: HGBA1C  Lipid Panel  No results found for: CHOL, TRIG, HDL, CHOLHDL, VLDL, LDLCALC, LDLDIRECT  BP Readings from Last 3 Encounters:  01/30/18 (!) 159/78  07/27/14 (!) 150/89    Allergies  Allergen Reactions  . Accupril [Quinapril Hcl] Other (See Comments) and Cough    NUMBNESS AND TINGLING  . Penicillins Hives  . Cephalosporins Hives and Itching  . Nitroglycerin Rash    PATCH    Medications Reviewed Today    Reviewed by Luretha Rued, RN (Registered Nurse) on 09/29/19 at 1414  Med List Status: <None>  Medication Order Taking? Sig Documenting Provider Last Dose Status Informant  albuterol (VENTOLIN HFA) 108 (90 Base) MCG/ACT inhaler [id number] Yes Inhale 1-2 puffs into the lungs every 6 (six) hours  as needed for wheezing or shortness of breath. [provider] Taking Active   aspirin EC 81 MG tablet [id number] Yes Take 81 mg by mouth daily. [provider] Taking Active   carvedilol (COREG) 6.25 MG tablet [id number] Yes Take 6.25 mg by mouth 2 (two) times daily with a meal. [provider] Taking Active   Dulaglutide (TRULICITY) 1.5 BD/5.3GD SOPN [id number] Yes Inject into the skin once a week. TUESDAY [provider] Taking Active   Fexofenadine HCl (ALLEGRA PO) [id number] Yes Take 180 mg by mouth daily. Takes for a month- alternates with Claritin each month.  [provider] Taking Active Self  glimepiride (AMARYL) 2 MG tablet [id number] No Take 2 mg by mouth daily with supper. [provider] Not Taking Active   glimepiride (AMARYL) 4 MG tablet [id number] No Take 4 mg by mouth daily with breakfast. [provider] Not Taking Active   lansoprazole (PREVACID) 30 MG capsule [id number]  Take 30 mg by mouth daily before supper.  [provider]  Active   Loratadine (CLARITIN PO) [id number]  Take 1 tablet by mouth daily. Takes for a month then alternates with allegra. [provider]  Active Self  metFORMIN (GLUCOPHAGE) 1000 MG tablet [id number] Yes Take 1 tablet by mouth 2 (two) times daily. [provider] Taking Active            Med Note Tammi Klippel, Nadara Mustard   Tue Jul 27, 2014  1:29 PM) Received from: External Pharmacy Received Sig:   Multiple Vitamins-Minerals (CENTRUM) tablet [id number] No Take 1 tablet by mouth daily. [provider] Not Taking Active   rosuvastatin (CRESTOR) 20 MG tablet [id number] No Take 20 mg by mouth 3 (three) times a week. [provider] Not Taking Active   sitaGLIPtin (JANUVIA) 100 MG tablet [id number] Yes Take 100 mg by mouth daily. [provider] Taking Active   telmisartan-hydrochlorothiazide (MICARDIS HCT) 80-12.5 MG tablet [id number] Yes Take 1 tablet by mouth  daily. [provider] Taking Active   verapamil (CALAN-SR) 240 MG CR tablet [id number] Yes Take 1 tablet by mouth daily. [provider] Taking Active            Med Note [name], Deno Etienne   Tue Apr 14, 2019 12:49 PM) Reports can only take a special formula - Verapamil SR 248m capsul by [employer]avis from [employer]           Assessment: Medication Assistance Findings:  Medication assistance needs identified: Trulicity, Januvia  Extra Help:  Not eligible for Extra Help Low Income Subsidy based on reported income and assets  Patient Assistance Programs: Trulicity made by ECrab Orchardrequirement met: Yes o Out-of-pocket prescription expenditure met:   Not Applicable - Patient has met application requirements to apply for this program.  - Reviewed program requirements with patient.    Januvia made by MDIRECTVo Income requirement met: Yes o Out-of-pocket prescription expenditure met:   Not Applicable - Patient has met application requirements to apply for this program.  - Reviewed program requirements with patient.     Plan: . I will route patient assistance letter to TElkadertechnician who will coordinate patient assistance program application process for medications listed above.  [facility] technician will assist with obtaining all required documents from both patient and provider(s) and submit application(s) once completed.    CRalene Bathe PharmD, [phone]

## 2019-10-01 ENCOUNTER — Other Ambulatory Visit: Payer: Self-pay | Admitting: Pharmacy Technician

## 2019-10-01 NOTE — Patient Outreach (Signed)
[facility]) Care Management  10/01/2019  SUMAYYAH CUSTODIO [birth year] [id number]                                       Medication Assistance Referral  Referral From: [place] / [employer] Patient application portion:  Mailed Provider application portion: Faxed blank application to Dr. Tobi Bastos Provider address/fax verified via: Office website  Medication/Company: Danelle Berry / Ralph Leyden Cares Patient application portion:  Mailed Provider application portion: Faxed  to Dr. Tobi Bastos Provider address/fax verified via: Office website   Follow up:  Will follow up with patient in 7-10 business days to confirm application(s) have been received.  Maud Deed Chana Bode [place] Certified Pharmacy Technician [place] Management Direct Dial:[phone]

## 2019-10-07 ENCOUNTER — Encounter: Payer: Self-pay | Admitting: *Deleted

## 2019-10-07 ENCOUNTER — Other Ambulatory Visit: Payer: Self-pay | Admitting: *Deleted

## 2019-10-07 NOTE — Patient Outreach (Signed)
[facility]) Care Management  10/07/2019  Jennifer [name] 04/12/1951 [id number]   CSW made an attempt to try and contact patient today to perform the initial phone assessment, as well as assess and assist with social work needs and services, without success.  When calling patient's home number [phone]), CSW received an automated recording that patient's phone is being screened by [employer], requesting that CSW leave her name and contact information, which was provided.  However, CSW was able to leave patient a HIPAA compliant message on her cell phone number [phone]).  CSW is currently awaiting a return call.  CSW will make a second outreach attempt within the next 3-4 business days, if a return call is not received from patient in the meantime.  CSW will also mail an Outreach Letter to patient's home requesting that patient contact CSW if patient is interested in receiving social work services through [place] with Scientist, clinical (histocompatibility and immunogenetics).  Jennifer [name], BSW, MSW, LCSW  Licensed Education officer, environmental Health System  Mailing [place] [address] [place], [place] [address] Physical Address-[address], [place], [place] [address] Toll Free Main # [phone] Fax # [phone] Cell # [phone]  Office # [phone] [name]

## 2019-10-13 ENCOUNTER — Other Ambulatory Visit: Payer: Self-pay | Admitting: *Deleted

## 2019-10-13 ENCOUNTER — Encounter: Payer: Self-pay | Admitting: *Deleted

## 2019-10-13 NOTE — Patient Outreach (Signed)
[facility]) Care Management  10/13/2019  GAVIN FAIVRE 02/11/1951 [id number]   CSW received an incoming call from patient, in response to CSW's attempts to try and converse with patient earlier in the day.  HIPAA compliant identifiers verified.  CSW was able to complete the initial assessment with patient, as well as answer her questions regarding psychotherapeutic services.  Patient reported that she was under a great deal of stress several months ago, but that her condition has since significantly improved, now that she has been able to place her husband into a long-term care facility.  Patient admitted that she was no longer able to manage her husband's care in the home, feeling guilty about having to place him into a skilled facility.  Patient reported that she knows she made the right decision placing her husband into a facility where he would receive the care he needs, but that it is difficult for her to accept that she is not the one providing his care.  CSW reminded patient that we all have limitations and that CSW commends patient for realizing that she had reached her limit with regards to providing care for her husband.  CSW went on to explain to patient that while she may have been providing adequate care for her husband, in the process, she was neglecting her own health and care needs.  Patient voiced understanding, indicating that she was in total agreement with what CSW was saying.  Patient stated that she is unable to visit her husband at the present time, due to COVID-19 restrictions, but that she able to converse with him as often as she likes.  Patient verbalized that she now feels as though a weight has been lifted and that she is actually able to come and go as she pleases, without having to worry.  Patient also stated that she is able to work her part-time job at [employer], [place] up additional hours to supplement her income.  Patient admitted that she was  actually considering contacting a psychologist to schedule psychotherapy appointments, until she learned that CSW can provide these services, free of charge, without ever even having to leave her home.  CSW reminded patient that CSW is a licensed [place] representative, able to offer counseling services, and that services are free, as well as telephonic, but that services are only short-term, at least until CSW is able to get patient established with a long-term therapist in the community.  Patient was somewhat disappointed, thinking that CSW was a psychologist and able to write psychotropic prescription medications.  CSW politely explained to patient that CSW is not a psychologist and that patient would actually need to see a psychiatrist if she is interested in having psychotropic medications prescribed.  Patient was polite in explaining to CSW that she is no longer interested in receiving services.  CSW offered to provide patient with a list of psychiatrists and therapists in her area that accept Medicare; however, patient indicated that she already has a list and that she will plan to outreach to them on her own.  CSW inquired as to how CSW could be of additional assistance to patient at this time, but patient denied being able to identify any additional social work needs at present, admitting that she has a vehicle and is able to drive herself, does not have food insecurities and has stable housing.  CSW will perform a case closure on patient, as all goals of treatment have been met from social work standpoint  and no additional social work needs have been identified at this time.  In addition, patient is declining social work services at this time.  CSW will notify Thea Silversmith, C-SNP (Chronic Special Needs Plan) Chronic Care Management Coordinator, also with [place] Management, of CSW's plans to close patient's case.  CSW will fax an update to patient's Primary Care Physician, Dr.  Lona Kettle  to ensure that they are aware of CSW's involvement with patient's plan of care.    Nat Christen, BSW, MSW, LCSW  Licensed Education officer, environmental Health System  Mailing [place] [address], [place], [place] [address] Physical Address[address], [place], [place] [address] Toll Free Main # [phone] Fax # [phone] Cell # [phone]  Office # [phone] [name]

## 2019-10-13 NOTE — Patient Outreach (Addendum)
[place] [facility]) Care Management  10/13/2019  Jennifer [name] September 06, 1951 [id number]   CSW was able to make initial contact with patient today, but only briefly, as patient reported that she was at work and unable to talk at the time of CSW's call.  CSW introduced self, explained role and types of services provided by CSW through [facility]) [place] Management.  CSW further explained to patient that CSW works with Thea Silversmith, C-SNP (Chronic Special Needs Plan) Chronic Care Management Coordinator, also with [facility].  CSW then explained the reason for the call, indicating that Ms. [name] thought that patient may benefit from social work services and resources to assist with counseling and supportive services for symptoms of stress.  HIPAA compliant identifiers verified, which included patient's name and date of birth.  Patient took down CSW's contact information, agreeing to return CSW's call at her earliest convenience.  CSW is currently awaiting a return call.  CSW will make a second outreach attempt within the next 3-4 business days, if a return call is not received from patient in the meantime.    Nat Christen, BSW, MSW, LCSW  Licensed Education officer, environmental Health System  Mailing [place] [address] [place], [place] [address] Physical Address-[address], [place], [place] [address] Toll Free Main # [phone] Fax # [phone] Cell # [phone]  Office # [phone] [name]

## 2019-10-14 ENCOUNTER — Encounter: Payer: Self-pay | Admitting: *Deleted

## 2019-10-19 ENCOUNTER — Ambulatory Visit: Payer: Self-pay | Admitting: *Deleted

## 2019-10-20 DIAGNOSIS — G720 Drug-induced myopathy: Secondary | ICD-10-CM | POA: Diagnosis not present

## 2019-10-20 DIAGNOSIS — E1169 Type 2 diabetes mellitus with other specified complication: Secondary | ICD-10-CM | POA: Diagnosis not present

## 2019-10-20 DIAGNOSIS — I10 Essential (primary) hypertension: Secondary | ICD-10-CM | POA: Diagnosis not present

## 2019-10-20 DIAGNOSIS — E559 Vitamin D deficiency, unspecified: Secondary | ICD-10-CM | POA: Diagnosis not present

## 2019-10-20 DIAGNOSIS — E7800 Pure hypercholesterolemia, unspecified: Secondary | ICD-10-CM | POA: Diagnosis not present

## 2019-10-20 DIAGNOSIS — Z0000 Encounter for general adult medical examination without abnormal findings: Secondary | ICD-10-CM | POA: Diagnosis not present

## 2019-10-20 DIAGNOSIS — J4520 Mild intermittent asthma, uncomplicated: Secondary | ICD-10-CM | POA: Diagnosis not present

## 2019-10-20 DIAGNOSIS — Z1159 Encounter for screening for other viral diseases: Secondary | ICD-10-CM | POA: Diagnosis not present

## 2019-10-22 ENCOUNTER — Other Ambulatory Visit: Payer: Self-pay | Admitting: Family Medicine

## 2019-10-22 DIAGNOSIS — E559 Vitamin D deficiency, unspecified: Secondary | ICD-10-CM

## 2019-10-22 DIAGNOSIS — E2839 Other primary ovarian failure: Secondary | ICD-10-CM

## 2019-10-29 ENCOUNTER — Other Ambulatory Visit: Payer: Self-pay | Admitting: Pharmacy Technician

## 2019-10-29 NOTE — Patient Outreach (Addendum)
[facility] [facility]) Care Management  10/29/2019  Jennifer [name] May 24, 1951 [id number]    Unsuccessful call placed to patient regarding patient assistance application(s) for Trulicity and Januvia , HIPAA compliant voicemail left.   Follow up:  Will make additional call attempt in 2-3 business days if call has not been returned.   ADDENDUM 2:57pm   Incoming call from patient regarding patient assistance application(s) for Trulicity and Januvia , HIPAA identifiers verified. [name] states that she has not received patient assistance applications. Patient is agreeable to having applications remailed to her and I informed her that they would arrive in a Health Team Ad[employer]age envelope.  Follow up:  Will follow up with patient in 7-10 business days to confirm applications have been recieved.  Maud Deed Chana Bode [place] Certified Pharmacy Technician [place] Management Direct Dial:[phone]

## 2019-11-06 ENCOUNTER — Other Ambulatory Visit: Payer: Self-pay | Admitting: Pharmacy Technician

## 2019-11-06 NOTE — Patient Outreach (Signed)
[place] [facility]) Care Management  11/06/2019  NATANE HEWARD [birth year] [id number]    Successful call placed to patient regarding patient assistance application(s) for Trulicity , HIPAA identifiers verified. Infromed Ms. Ipock that I received her application for Januvia thru [employer] back however she did not include the patient assistance application for Trulicity thru [employer]. She states that she thought that she had and will check her paperwork when she gets home.  Also informed her that I am waiting for Dr. Harrington Challenger to send me his portion of [employer] application and then I will submit to [name].  Follow up:  Will follow up with patient in 2-3 business days to verify she has the [employer] application.  Maud Deed Chana Bode [place] Certified Pharmacy Technician [place] Management Direct [phone]

## 2019-11-09 DIAGNOSIS — H9202 Otalgia, left ear: Secondary | ICD-10-CM | POA: Diagnosis not present

## 2019-11-10 ENCOUNTER — Other Ambulatory Visit: Payer: Self-pay | Admitting: Pharmacy Technician

## 2019-11-10 NOTE — Patient Outreach (Signed)
[place] [facility]) Care Management  11/10/2019  ABRIANNA SIDMAN 1951/09/30 [id number]   Received patient portion(s) of patient assistance application(s) for Trulicity. Faxed completed application and required documents into [employer].  Will follow up with company(ies) in 5-7 business days to check status of application(s).  Maud Deed Chana Bode [place] Certified Pharmacy Technician [place] Management Direct Dial:[phone]

## 2019-11-12 ENCOUNTER — Other Ambulatory Visit: Payer: Self-pay | Admitting: Pharmacy Technician

## 2019-11-12 NOTE — Patient Outreach (Signed)
[name]  11/12/2019  Jennifer [name] May 09, 1951 [id number]    Follow up call placed to [facility] regarding patient assistance application(s) for Trulicity , Jennifer [name] confirms patient has been approved as of 11/19 until 12/24/19. Faxed prescription portion to [facility]  Follow up:  Will follow up with [facility] in 3-5 business days to check shipping status.  Received provider portion of [employer] application for [name]. Will mail out completed application on Dec 1st so that patients approval be considered for 2021.  [name] Certified Pharmacy Technician [place] Management Direct Dial:[phone]

## 2019-11-24 ENCOUNTER — Other Ambulatory Visit: Payer: Self-pay | Admitting: Pharmacy Technician

## 2019-11-24 NOTE — Patient Outreach (Signed)
[place] [facility]) Care Management  11/24/2019  Jennifer [name] December 18, 1951 [id number]   Incoming call from patient stating that Gean Birchwood is scheduled to deliver her Trulicity to her home on 12/2. Informed her that we will need to re-apply to [facility] for 2021, informed her I would follow up with her when it's time.  Will follow up with patient in 10-14 business days in regards to additional application being processed.  Maud Deed Chana Bode [place] Certified Pharmacy Technician [place] Management Direct Dial:[phone]

## 2019-12-10 ENCOUNTER — Other Ambulatory Visit: Payer: Self-pay | Admitting: Pharmacy Technician

## 2019-12-10 NOTE — Patient Outreach (Signed)
[place] [facility]) Care Management  12/10/2019  Jennifer [name] 08-17-1951 [id number]    Follow up call placed to [employer] regarding patient assistance application(s) for Jennifer [name] confirms application has been received. Attestation form mailed out to patient on 12/10.  Had [place] S review patients medication and will start re-enrollment process for Trulicity thru [employer]. Already have patient portion of application, will fax Dr. Harrington Challenger provider portion.    Successful call placed to patient regarding patient assistance receipt of attestation form from [place] for [name], HIPAA identifiers verified. Jennifer [name] states that she has not received attestation form as of yet. Requested that she contact me when she has so that I may assist her with completing. She states that she would.  Follow up:  Will follow up with patient in 2-3 business days if call has not been returned.  Maud Deed Chana Bode [place] Certified Pharmacy Technician [place] Management Direct Dial:[phone]

## 2019-12-23 ENCOUNTER — Other Ambulatory Visit: Payer: Self-pay | Admitting: Pharmacy Technician

## 2019-12-23 NOTE — Patient Outreach (Addendum)
[place] [facility]) Care Management  12/23/2019  Jennifer [name] 1951/02/04 [id number]   Received provider portion(s) of patient assistance application(s) for Trulicity. Faxed completed application and required documents into [employer].  Will follow up with company(ies) in 10-14 business days to check status of application(s).   Unsuccessful call placed to patient regarding patient assistance receipt of attestation form from [employer] for [name], HIPAA compliant voicemail left.   Follow up:  Will make additional call attempt in the next 5-7 business days if call has not been returned.  ADDENDUM 2:49pm  Return call from patient stating that she received attestation form from [name], completed it and mailed it back into company.  Will follow up with [employer] in 10-14 business days to check status of application.  Maud Deed Chana Bode [place] Certified Pharmacy Technician [place] Management Direct [phone]

## 2020-01-07 ENCOUNTER — Other Ambulatory Visit: Payer: Self-pay | Admitting: Pharmacy Technician

## 2020-01-07 NOTE — Patient Outreach (Signed)
[facility]) Care Management  01/07/2020  KYSHA MURALLES 01-26-1951 [id number]    Follow up call placed to [employer] regarding patient assistance application(s) for [place] confirms patient has been approved as of 1/11 until 12/23/2020. 90 day supply of medication to arrive at patient's home in 7-10 business days.   Follow up call placed to [facility] regarding patient assistance application(s) for Trulicity , Marcelle Smiling confirms patient has been approved as of 1/6 until 12/23/2020. Medication to arrive at patient's home in 7-10 business days.  Follow up:  Will follow up with patient in 10-14 business days to confirm medications have been received.  Suzan Slick Effie Shy CPhT Certified Pharmacy Technician [facility] Care Management Direct Dial:[phone]

## 2020-01-13 DIAGNOSIS — Z1231 Encounter for screening mammogram for malignant neoplasm of breast: Secondary | ICD-10-CM | POA: Diagnosis not present

## 2020-01-14 ENCOUNTER — Ambulatory Visit
Admission: RE | Admit: 2020-01-14 | Discharge: 2020-01-14 | Disposition: A | Discharge status: Home / Self Care | Payer: HMO | Source: Ambulatory Visit | Attending: Family Medicine | Admitting: Family Medicine

## 2020-01-14 ENCOUNTER — Other Ambulatory Visit: Payer: Self-pay

## 2020-01-14 DIAGNOSIS — Z780 Asymptomatic menopausal state: Secondary | ICD-10-CM | POA: Diagnosis not present

## 2020-01-14 DIAGNOSIS — E2839 Other primary ovarian failure: Secondary | ICD-10-CM

## 2020-01-14 DIAGNOSIS — E559 Vitamin D deficiency, unspecified: Secondary | ICD-10-CM

## 2020-01-27 ENCOUNTER — Other Ambulatory Visit: Payer: Self-pay | Admitting: Pharmacy Technician

## 2020-01-27 NOTE — Patient Outreach (Signed)
[facility]) Care Management  01/27/2020  Jennifer [name] 11-25-1951 [id number]    Successful call placed to patient regarding patient assistance medication delivery of Trulicity, HIPAA identifiers verified. Jennifer [name] confirms that she received the Trulicity from [employer]. Reviewed with patient how to obtain refills.   Informed her that she has been approved for [employer], provided her with contact number for [facility] pharmacy to request refills when she gets down to a 1 month supply. Requested she contact me if she has any issues obtaining the refill.  Follow up:  Will route note to [facility] for case closure  Suzan Slick. Effie Shy CPhT Certified Pharmacy Technician [facility] Care Management Direct [phone]

## 2020-01-28 ENCOUNTER — Other Ambulatory Visit: Payer: Self-pay | Admitting: Pharmacist

## 2020-01-28 NOTE — Patient Outreach (Signed)
[facility]) Care Management  [facility]   01/28/2020  Jennifer [name] 03-30-1951 [id number]  Patient has been approved for Trulicity and [employer] patient assistance program(s).  Patient has been instructed on how to order refills and renewal process for 2021.  Plan: Will f/u with patient in 3-4 months on medication management as she is enrolled in HTA C-SNP plan  Haynes Hoehn, PharmD, [facility] Clinical Pharmacist [facility] [employer] [employer] [phone]

## 2020-02-19 DIAGNOSIS — M47816 Spondylosis without myelopathy or radiculopathy, lumbar region: Secondary | ICD-10-CM | POA: Diagnosis not present

## 2020-02-19 DIAGNOSIS — M19041 Primary osteoarthritis, right hand: Secondary | ICD-10-CM | POA: Diagnosis not present

## 2020-02-19 DIAGNOSIS — J4520 Mild intermittent asthma, uncomplicated: Secondary | ICD-10-CM | POA: Diagnosis not present

## 2020-02-19 DIAGNOSIS — I10 Essential (primary) hypertension: Secondary | ICD-10-CM | POA: Diagnosis not present

## 2020-02-19 DIAGNOSIS — E1169 Type 2 diabetes mellitus with other specified complication: Secondary | ICD-10-CM | POA: Diagnosis not present

## 2020-02-19 DIAGNOSIS — M47812 Spondylosis without myelopathy or radiculopathy, cervical region: Secondary | ICD-10-CM | POA: Diagnosis not present

## 2020-02-19 DIAGNOSIS — E7800 Pure hypercholesterolemia, unspecified: Secondary | ICD-10-CM | POA: Diagnosis not present

## 2020-02-19 DIAGNOSIS — M189 Osteoarthritis of first carpometacarpal joint, unspecified: Secondary | ICD-10-CM | POA: Diagnosis not present

## 2020-02-25 ENCOUNTER — Ambulatory Visit: Payer: BLUE CROSS/BLUE SHIELD

## 2020-03-11 ENCOUNTER — Other Ambulatory Visit: Payer: Self-pay | Admitting: Pharmacist

## 2020-03-11 NOTE — Patient Outreach (Signed)
[facility]) Care Management  [facility] CM Pharmacy   03/11/2020  ANTHONETTE LESAGE [birth year] [id number]  [facility] pharmacy case will be closed as our team is transitioning from the [facility] Care Management Department into the [facility] Quality Department and will no longer be using [facility] for documentation purposes.     Haynes Hoehn, PharmD, [facility] Clinical Pharmacist [facility] [employer] [phone]

## 2020-03-16 DIAGNOSIS — Z7984 Long term (current) use of oral hypoglycemic drugs: Secondary | ICD-10-CM | POA: Diagnosis not present

## 2020-03-16 DIAGNOSIS — M47812 Spondylosis without myelopathy or radiculopathy, cervical region: Secondary | ICD-10-CM | POA: Diagnosis not present

## 2020-03-16 DIAGNOSIS — I10 Essential (primary) hypertension: Secondary | ICD-10-CM | POA: Diagnosis not present

## 2020-03-16 DIAGNOSIS — M19041 Primary osteoarthritis, right hand: Secondary | ICD-10-CM | POA: Diagnosis not present

## 2020-03-16 DIAGNOSIS — M189 Osteoarthritis of first carpometacarpal joint, unspecified: Secondary | ICD-10-CM | POA: Diagnosis not present

## 2020-03-16 DIAGNOSIS — J4520 Mild intermittent asthma, uncomplicated: Secondary | ICD-10-CM | POA: Diagnosis not present

## 2020-03-16 DIAGNOSIS — E1169 Type 2 diabetes mellitus with other specified complication: Secondary | ICD-10-CM | POA: Diagnosis not present

## 2020-03-16 DIAGNOSIS — M47816 Spondylosis without myelopathy or radiculopathy, lumbar region: Secondary | ICD-10-CM | POA: Diagnosis not present

## 2020-03-16 DIAGNOSIS — E7800 Pure hypercholesterolemia, unspecified: Secondary | ICD-10-CM | POA: Diagnosis not present

## 2020-03-22 ENCOUNTER — Other Ambulatory Visit: Payer: Self-pay

## 2020-03-22 NOTE — Patient Outreach (Addendum)
[facility]) Care Management Chronic Special Needs Program  03/22/2020  Name: Jennifer [name] DOB: 06-16-1951  MRN: [id number]  Jennifer [name] is enrolled in a chronic special needs plan for Diabetes. Chronic Care Management Coordinator telephoned client to review health risk assessment and to update individualized care plan.  Introduced the chronic care management program, importance of client participation, and taking their care plan to all provider appointments and inpatient facilities.    Subjective: Client reports husband recently past February 27, 2020, but states she is better. She reports she has been in contact with her doctor regarding her loss and has been prescribed lorazepam and ondansetron prn. She states she is beginning to feel better and just recently started becoming interested in doing more things. Client is receptive to social work referral regarding grief counseling.  She reports last A1C 5.7. her blood sugar today was 128. She denies any questions or concerns regarding diabetes. She states she is able to get her medications and [facility].    Goals Addressed            This Visit's Progress   . Client will verbalize knowledge of self management of Hypertension as evidences by BP reading of 140/90 or less; or as defined by provider   On track    It is important to follow up with your provider as scheduled for recommended labs, procedures and medication refills.  High blood pressure self management actions: Follow up with your doctor as scheduled. Take your medications as prescribed by your doctor. Ask your doctor "what is my target blood pressure range". Monitor your blood pressure and take results to your doctor's appointment.  Monitor the amount of salt you are eating. Continue to exercise as tolerated and remain active. Eat heart healthy diet (full of fruits, vegetables, whole grains, lean protein, water--limit  salt, fat, and sugar/simple carb intake). Mailed education: "DASH diet" please review and call if you have any questions.    Marland Kitchen HEMOGLOBIN A1C < 7.0       A1C 5.9 on 10/20/2019 Great job!  Diabetes self management actions:  Glucose monitoring per provider recommendations  Eat Healthy  Visit provider every 3-6 months as directed  Hbg A1C level every 3-6 months. Ask your doctor, "what is my Target A1C goal?" Ask you doctor, "what is my Target blood sugar range?"    . COMPLETED: Maintain timely refills of diabetic medication as prescribed within the year .       Denies any difficulty obtaining medications.    . Obtain annual  Lipid Profile, LDL-C   On track    It is important to follow up with your provider as scheduled for recommended labs. Try to avoid saturated fats, trans-fats, and eat more fiber. Mailed education: "Lipid test". Please review and call if you have any questions.    . Obtain Annual Eye (retinal)  Exam    On track    Reports had eye exam 2020 with upcoming annual exam scheduled for this year.  Goal renewed 2021: It is important to attend scheduled appointments for recommended yearly exam. Mailed: Diabetes care checklist" please review and call if you have any questions.    . Obtain Annual Foot Exam   On track    It is important to follow up with your provider as scheduled for recommended exam. Mailed education: "foot care for diabetics". Please review and call if you have any questions.    . Obtain annual screen  for micro albuminuria (urine) , nephropathy (kidney problems)   On track    It is important to follow up with your provider as scheduled for recommended labs.    . Obtain Hemoglobin A1C at least 2 times per year   On track    It is important to follow up with your provider as scheduled for recommended labs.    . Patient Stated: verbalize contact with [facility] social worker   On track    Discussed recent loss and resources available if needed. Provided  contact number for [facility] help line open to CSNP clients available 8am-5pm [phone]. Your CSNP nurse care coordinator has made a referral to [place] work. If you do not hear from the social worker within 10 days, please contact me [phone])     . Visit Primary Care Provider or Endocrinologist at least 2 times per year    On track    It is important to follow up with your provider as scheduled for recommended labs, procedures and medication refills.      Discussed Covid 19 precautions. Client reports she has received her covid vaccinations through her employer.   Plan: RNCM will send updated care plan to client; send updated care plan to primary care. Send Neurosurgeon. Chronic care management coordination will outreach per tier level within 6 months.   Thea Silversmith, RN, MSN, [place] [facility] [phone]

## 2020-04-04 DIAGNOSIS — J4520 Mild intermittent asthma, uncomplicated: Secondary | ICD-10-CM | POA: Diagnosis not present

## 2020-04-04 DIAGNOSIS — F4321 Adjustment disorder with depressed mood: Secondary | ICD-10-CM | POA: Diagnosis not present

## 2020-04-04 DIAGNOSIS — M47816 Spondylosis without myelopathy or radiculopathy, lumbar region: Secondary | ICD-10-CM | POA: Diagnosis not present

## 2020-04-04 DIAGNOSIS — M19041 Primary osteoarthritis, right hand: Secondary | ICD-10-CM | POA: Diagnosis not present

## 2020-04-04 DIAGNOSIS — E7800 Pure hypercholesterolemia, unspecified: Secondary | ICD-10-CM | POA: Diagnosis not present

## 2020-04-04 DIAGNOSIS — I10 Essential (primary) hypertension: Secondary | ICD-10-CM | POA: Diagnosis not present

## 2020-04-04 DIAGNOSIS — M47812 Spondylosis without myelopathy or radiculopathy, cervical region: Secondary | ICD-10-CM | POA: Diagnosis not present

## 2020-04-04 DIAGNOSIS — E1169 Type 2 diabetes mellitus with other specified complication: Secondary | ICD-10-CM | POA: Diagnosis not present

## 2020-04-04 DIAGNOSIS — M189 Osteoarthritis of first carpometacarpal joint, unspecified: Secondary | ICD-10-CM | POA: Diagnosis not present

## 2020-04-20 DIAGNOSIS — F4321 Adjustment disorder with depressed mood: Secondary | ICD-10-CM | POA: Diagnosis not present

## 2020-04-20 DIAGNOSIS — E1169 Type 2 diabetes mellitus with other specified complication: Secondary | ICD-10-CM | POA: Diagnosis not present

## 2020-04-20 DIAGNOSIS — R109 Unspecified abdominal pain: Secondary | ICD-10-CM | POA: Diagnosis not present

## 2020-04-20 DIAGNOSIS — G720 Drug-induced myopathy: Secondary | ICD-10-CM | POA: Diagnosis not present

## 2020-04-29 DIAGNOSIS — M25552 Pain in left hip: Secondary | ICD-10-CM | POA: Diagnosis not present

## 2020-04-29 DIAGNOSIS — M545 Low back pain: Secondary | ICD-10-CM | POA: Diagnosis not present

## 2020-04-29 DIAGNOSIS — M25551 Pain in right hip: Secondary | ICD-10-CM | POA: Diagnosis not present

## 2020-05-04 DIAGNOSIS — M2560 Stiffness of unspecified joint, not elsewhere classified: Secondary | ICD-10-CM | POA: Diagnosis not present

## 2020-05-04 DIAGNOSIS — M6281 Muscle weakness (generalized): Secondary | ICD-10-CM | POA: Diagnosis not present

## 2020-05-04 DIAGNOSIS — M542 Cervicalgia: Secondary | ICD-10-CM | POA: Diagnosis not present

## 2020-05-04 DIAGNOSIS — R262 Difficulty in walking, not elsewhere classified: Secondary | ICD-10-CM | POA: Diagnosis not present

## 2020-05-06 DIAGNOSIS — M545 Low back pain: Secondary | ICD-10-CM | POA: Diagnosis not present

## 2020-05-06 DIAGNOSIS — M6281 Muscle weakness (generalized): Secondary | ICD-10-CM | POA: Diagnosis not present

## 2020-05-06 DIAGNOSIS — R262 Difficulty in walking, not elsewhere classified: Secondary | ICD-10-CM | POA: Diagnosis not present

## 2020-05-06 DIAGNOSIS — M2560 Stiffness of unspecified joint, not elsewhere classified: Secondary | ICD-10-CM | POA: Diagnosis not present

## 2020-05-09 DIAGNOSIS — M545 Low back pain: Secondary | ICD-10-CM | POA: Diagnosis not present

## 2020-05-09 DIAGNOSIS — R262 Difficulty in walking, not elsewhere classified: Secondary | ICD-10-CM | POA: Diagnosis not present

## 2020-05-09 DIAGNOSIS — M6281 Muscle weakness (generalized): Secondary | ICD-10-CM | POA: Diagnosis not present

## 2020-05-09 DIAGNOSIS — M2560 Stiffness of unspecified joint, not elsewhere classified: Secondary | ICD-10-CM | POA: Diagnosis not present

## 2020-05-12 DIAGNOSIS — M545 Low back pain: Secondary | ICD-10-CM | POA: Diagnosis not present

## 2020-05-12 DIAGNOSIS — M6281 Muscle weakness (generalized): Secondary | ICD-10-CM | POA: Diagnosis not present

## 2020-05-12 DIAGNOSIS — R262 Difficulty in walking, not elsewhere classified: Secondary | ICD-10-CM | POA: Diagnosis not present

## 2020-05-12 DIAGNOSIS — M2560 Stiffness of unspecified joint, not elsewhere classified: Secondary | ICD-10-CM | POA: Diagnosis not present

## 2020-05-16 DIAGNOSIS — R262 Difficulty in walking, not elsewhere classified: Secondary | ICD-10-CM | POA: Diagnosis not present

## 2020-05-16 DIAGNOSIS — M2560 Stiffness of unspecified joint, not elsewhere classified: Secondary | ICD-10-CM | POA: Diagnosis not present

## 2020-05-16 DIAGNOSIS — M545 Low back pain: Secondary | ICD-10-CM | POA: Diagnosis not present

## 2020-05-16 DIAGNOSIS — M6281 Muscle weakness (generalized): Secondary | ICD-10-CM | POA: Diagnosis not present

## 2020-05-18 DIAGNOSIS — R262 Difficulty in walking, not elsewhere classified: Secondary | ICD-10-CM | POA: Diagnosis not present

## 2020-05-18 DIAGNOSIS — M2560 Stiffness of unspecified joint, not elsewhere classified: Secondary | ICD-10-CM | POA: Diagnosis not present

## 2020-05-18 DIAGNOSIS — M545 Low back pain: Secondary | ICD-10-CM | POA: Diagnosis not present

## 2020-05-18 DIAGNOSIS — M6281 Muscle weakness (generalized): Secondary | ICD-10-CM | POA: Diagnosis not present

## 2020-05-19 DIAGNOSIS — H60313 Diffuse otitis externa, bilateral: Secondary | ICD-10-CM | POA: Diagnosis not present

## 2020-05-19 DIAGNOSIS — H905 Unspecified sensorineural hearing loss: Secondary | ICD-10-CM | POA: Diagnosis not present

## 2020-05-19 DIAGNOSIS — H9041 Sensorineural hearing loss, unilateral, right ear, with unrestricted hearing on the contralateral side: Secondary | ICD-10-CM | POA: Diagnosis not present

## 2020-05-25 DIAGNOSIS — M6281 Muscle weakness (generalized): Secondary | ICD-10-CM | POA: Diagnosis not present

## 2020-05-25 DIAGNOSIS — R262 Difficulty in walking, not elsewhere classified: Secondary | ICD-10-CM | POA: Diagnosis not present

## 2020-05-25 DIAGNOSIS — M2560 Stiffness of unspecified joint, not elsewhere classified: Secondary | ICD-10-CM | POA: Diagnosis not present

## 2020-05-25 DIAGNOSIS — M545 Low back pain: Secondary | ICD-10-CM | POA: Diagnosis not present

## 2020-05-26 ENCOUNTER — Ambulatory Visit: Payer: HMO | Admitting: Pharmacist

## 2020-05-26 DIAGNOSIS — K219 Gastro-esophageal reflux disease without esophagitis: Secondary | ICD-10-CM | POA: Diagnosis not present

## 2020-05-26 DIAGNOSIS — R1031 Right lower quadrant pain: Secondary | ICD-10-CM | POA: Diagnosis not present

## 2020-05-27 DIAGNOSIS — R262 Difficulty in walking, not elsewhere classified: Secondary | ICD-10-CM | POA: Diagnosis not present

## 2020-05-27 DIAGNOSIS — M545 Low back pain: Secondary | ICD-10-CM | POA: Diagnosis not present

## 2020-05-27 DIAGNOSIS — M2560 Stiffness of unspecified joint, not elsewhere classified: Secondary | ICD-10-CM | POA: Diagnosis not present

## 2020-05-27 DIAGNOSIS — M6281 Muscle weakness (generalized): Secondary | ICD-10-CM | POA: Diagnosis not present

## 2020-06-01 DIAGNOSIS — M6281 Muscle weakness (generalized): Secondary | ICD-10-CM | POA: Diagnosis not present

## 2020-06-01 DIAGNOSIS — M2560 Stiffness of unspecified joint, not elsewhere classified: Secondary | ICD-10-CM | POA: Diagnosis not present

## 2020-06-01 DIAGNOSIS — M545 Low back pain: Secondary | ICD-10-CM | POA: Diagnosis not present

## 2020-06-01 DIAGNOSIS — R262 Difficulty in walking, not elsewhere classified: Secondary | ICD-10-CM | POA: Diagnosis not present

## 2020-06-03 DIAGNOSIS — R262 Difficulty in walking, not elsewhere classified: Secondary | ICD-10-CM | POA: Diagnosis not present

## 2020-06-03 DIAGNOSIS — M6281 Muscle weakness (generalized): Secondary | ICD-10-CM | POA: Diagnosis not present

## 2020-06-03 DIAGNOSIS — M545 Low back pain: Secondary | ICD-10-CM | POA: Diagnosis not present

## 2020-06-03 DIAGNOSIS — M2560 Stiffness of unspecified joint, not elsewhere classified: Secondary | ICD-10-CM | POA: Diagnosis not present

## 2020-06-08 DIAGNOSIS — M2560 Stiffness of unspecified joint, not elsewhere classified: Secondary | ICD-10-CM | POA: Diagnosis not present

## 2020-06-21 DIAGNOSIS — M47816 Spondylosis without myelopathy or radiculopathy, lumbar region: Secondary | ICD-10-CM | POA: Diagnosis not present

## 2020-06-21 DIAGNOSIS — F4321 Adjustment disorder with depressed mood: Secondary | ICD-10-CM | POA: Diagnosis not present

## 2020-06-21 DIAGNOSIS — E1169 Type 2 diabetes mellitus with other specified complication: Secondary | ICD-10-CM | POA: Diagnosis not present

## 2020-06-21 DIAGNOSIS — I10 Essential (primary) hypertension: Secondary | ICD-10-CM | POA: Diagnosis not present

## 2020-06-21 DIAGNOSIS — M189 Osteoarthritis of first carpometacarpal joint, unspecified: Secondary | ICD-10-CM | POA: Diagnosis not present

## 2020-06-21 DIAGNOSIS — E7800 Pure hypercholesterolemia, unspecified: Secondary | ICD-10-CM | POA: Diagnosis not present

## 2020-06-21 DIAGNOSIS — M19041 Primary osteoarthritis, right hand: Secondary | ICD-10-CM | POA: Diagnosis not present

## 2020-06-21 DIAGNOSIS — M47812 Spondylosis without myelopathy or radiculopathy, cervical region: Secondary | ICD-10-CM | POA: Diagnosis not present

## 2020-06-21 DIAGNOSIS — J4520 Mild intermittent asthma, uncomplicated: Secondary | ICD-10-CM | POA: Diagnosis not present

## 2020-07-11 DIAGNOSIS — M189 Osteoarthritis of first carpometacarpal joint, unspecified: Secondary | ICD-10-CM | POA: Diagnosis not present

## 2020-07-11 DIAGNOSIS — E1169 Type 2 diabetes mellitus with other specified complication: Secondary | ICD-10-CM | POA: Diagnosis not present

## 2020-07-11 DIAGNOSIS — M19041 Primary osteoarthritis, right hand: Secondary | ICD-10-CM | POA: Diagnosis not present

## 2020-07-11 DIAGNOSIS — F4321 Adjustment disorder with depressed mood: Secondary | ICD-10-CM | POA: Diagnosis not present

## 2020-07-11 DIAGNOSIS — E7800 Pure hypercholesterolemia, unspecified: Secondary | ICD-10-CM | POA: Diagnosis not present

## 2020-07-11 DIAGNOSIS — I10 Essential (primary) hypertension: Secondary | ICD-10-CM | POA: Diagnosis not present

## 2020-07-11 DIAGNOSIS — M47816 Spondylosis without myelopathy or radiculopathy, lumbar region: Secondary | ICD-10-CM | POA: Diagnosis not present

## 2020-07-11 DIAGNOSIS — J4520 Mild intermittent asthma, uncomplicated: Secondary | ICD-10-CM | POA: Diagnosis not present

## 2020-07-11 DIAGNOSIS — M47812 Spondylosis without myelopathy or radiculopathy, cervical region: Secondary | ICD-10-CM | POA: Diagnosis not present

## 2020-08-03 DIAGNOSIS — M542 Cervicalgia: Secondary | ICD-10-CM | POA: Diagnosis not present

## 2020-08-03 DIAGNOSIS — M25521 Pain in right elbow: Secondary | ICD-10-CM | POA: Diagnosis not present

## 2020-08-18 DIAGNOSIS — E7800 Pure hypercholesterolemia, unspecified: Secondary | ICD-10-CM | POA: Diagnosis not present

## 2020-08-18 DIAGNOSIS — M189 Osteoarthritis of first carpometacarpal joint, unspecified: Secondary | ICD-10-CM | POA: Diagnosis not present

## 2020-08-18 DIAGNOSIS — F4321 Adjustment disorder with depressed mood: Secondary | ICD-10-CM | POA: Diagnosis not present

## 2020-08-18 DIAGNOSIS — K219 Gastro-esophageal reflux disease without esophagitis: Secondary | ICD-10-CM | POA: Diagnosis not present

## 2020-08-18 DIAGNOSIS — E1169 Type 2 diabetes mellitus with other specified complication: Secondary | ICD-10-CM | POA: Diagnosis not present

## 2020-08-18 DIAGNOSIS — M47816 Spondylosis without myelopathy or radiculopathy, lumbar region: Secondary | ICD-10-CM | POA: Diagnosis not present

## 2020-08-18 DIAGNOSIS — M47812 Spondylosis without myelopathy or radiculopathy, cervical region: Secondary | ICD-10-CM | POA: Diagnosis not present

## 2020-08-18 DIAGNOSIS — E119 Type 2 diabetes mellitus without complications: Secondary | ICD-10-CM | POA: Diagnosis not present

## 2020-08-18 DIAGNOSIS — I10 Essential (primary) hypertension: Secondary | ICD-10-CM | POA: Diagnosis not present

## 2020-08-18 DIAGNOSIS — M19041 Primary osteoarthritis, right hand: Secondary | ICD-10-CM | POA: Diagnosis not present

## 2020-08-18 DIAGNOSIS — J4520 Mild intermittent asthma, uncomplicated: Secondary | ICD-10-CM | POA: Diagnosis not present

## 2020-09-02 DIAGNOSIS — M25521 Pain in right elbow: Secondary | ICD-10-CM | POA: Diagnosis not present

## 2020-09-07 DIAGNOSIS — E1169 Type 2 diabetes mellitus with other specified complication: Secondary | ICD-10-CM | POA: Diagnosis not present

## 2020-09-07 DIAGNOSIS — E7800 Pure hypercholesterolemia, unspecified: Secondary | ICD-10-CM | POA: Diagnosis not present

## 2020-09-07 DIAGNOSIS — R413 Other amnesia: Secondary | ICD-10-CM | POA: Diagnosis not present

## 2020-09-07 DIAGNOSIS — Z23 Encounter for immunization: Secondary | ICD-10-CM | POA: Diagnosis not present

## 2020-09-07 DIAGNOSIS — E559 Vitamin D deficiency, unspecified: Secondary | ICD-10-CM | POA: Diagnosis not present

## 2020-09-14 ENCOUNTER — Other Ambulatory Visit: Payer: Self-pay

## 2020-09-14 NOTE — Patient Outreach (Signed)
[facility]) Care Management Chronic Special Needs Program  09/14/2020  Name: SAHARRA SANTO DOB: 1951-09-17  MRN: [id number]  Ms. Marieanne Marxen is enrolled in a chronic special needs plan for Diabetes. RNCM called to follow up and update care plan.   Subjective: Client reports she is "trying to do" and expressed recent loss of her husband. She reports she is attending "grief share at her church". Client is articulate in sharing how she misses her husband. She continues to work part time and is addressing other issues regarding the estate. Client reports she is going to see neurologist regarding memory, although she verbalized that some of the issues may be due to recent loss of her husband. She reports has recently completed physical therapy session that she attends at intervals due to a history of neck arthritis since age 69. She reports physical therapy always helps and it is better now. Without any complaints regarding pain at this time.  Client reports last A1C checked at her house by [facility] provider was 7.2 in August 2021 which is an increase from A1C 5.7 04/20/20. Client reports was put on a Prednisone and states this is one of the things that attributed to the increase. She reports medications changed by primary care provider. Ms. Herbison states blood sugar 107 today. Client also expressed that she does not check her blood sugar as often as recommended because she does not like to stick her finger.   Goals Addressed            This Visit's Progress   . Client will verbalize knowledge of self management of Hypertension as evidences by BP reading of 140/90 or less; or as defined by provider   On track    Blood pressure 140/82 09/07/2020 and 139/90 05/19/20.   Continue high blood pressure self management actions: Follow up with your doctor as scheduled. Take your medications as prescribed by your doctor. Know your target blood pressure range.  Monitor  your blood pressure and take results to your doctor's appointment.  Monitor the amount of salt you are eating. Continue to exercise as tolerated and remain active. Eat heart healthy diet (full of fruits, vegetables, whole grains, lean protein, water--limit salt, fat, and sugar/simple carb intake).     Marland Kitchen HEMOGLOBIN A1C < 7       Client reports A1C per [facility] home visit (optum) 7.2 in August 2021; A1C 5.7 on 04/20/2020.    Discussed Freestyle Libre as option to improve compliance. Client states she will call [employer] [facility] to confirm coverage and follow up with her primary care to request a prescription. If you have any additional questions or would like for your RN Care Management coordinator to assist you with this please call [phone].  Continue Diabetes self management actions:  Glucose monitoring per provider recommendations  Eat Healthy: low carbohydrate and low salt meals, watch portion sizes and avoid sugar sweetened drinks.  Visit provider every 3-6 months as directed  Hbg A1C level every 3-6 months.  Take medications as prescribed  Attend provider visits as recommended.    . COMPLETED: Obtain annual  Lipid Profile, LDL-C       Done 09/07/2020    . COMPLETED: Obtain Annual Eye (retinal)  Exam        Done 07/07/2020    . COMPLETED: Obtain Annual Foot Exam       Done 09/07/2020    . COMPLETED: Obtain annual screen for micro albuminuria (urine) , nephropathy (kidney  problems)       Done 09/07/2020    . COMPLETED: Obtain Hemoglobin A1C at least 2 times per year       A1C 7.25 July 2020; A1C 5.7 04/20/2020.     Marland Kitchen Patient Stated: verbalize contact with [facility] social worker   On track    RNCM diiscussed recent loss with client and coping strategies.  Client reports is attending "Grief Share  at her [place].  [facility] Social worker referral for continued support/counseling. Emmi education:"dealing with death" and "how to help your memory". Please review and  contact your RN care coordinator as needed.     . COMPLETED: Visit Primary Care Provider or Endocrinologist at least 2 times per year        Client reports see primary care provider at least twice this year. Last visit completed 09/07/2020. Discussed upcoming appointment with specialist.       Client reports she has received both covid vaccinations and the flu vaccine for this year.  Discussed over the counter benefit; Freestyle Libre glucometer to increase compliance with checking blood sugar and in-network specialist. RNCM offered to call, however client states would be better for her to call. Social work referral completed.  Plan: send updated care plan to client; send updated care plan to primary care provider;  Next outreach per tier level within the next 6 months.  Kathyrn Sheriff, RN, MSN, [facility] Chronic Care Management Coordinator [facility] [phone]

## 2020-09-20 DIAGNOSIS — K219 Gastro-esophageal reflux disease without esophagitis: Secondary | ICD-10-CM | POA: Diagnosis not present

## 2020-09-20 DIAGNOSIS — M189 Osteoarthritis of first carpometacarpal joint, unspecified: Secondary | ICD-10-CM | POA: Diagnosis not present

## 2020-09-20 DIAGNOSIS — M47816 Spondylosis without myelopathy or radiculopathy, lumbar region: Secondary | ICD-10-CM | POA: Diagnosis not present

## 2020-09-20 DIAGNOSIS — I10 Essential (primary) hypertension: Secondary | ICD-10-CM | POA: Diagnosis not present

## 2020-09-20 DIAGNOSIS — J4520 Mild intermittent asthma, uncomplicated: Secondary | ICD-10-CM | POA: Diagnosis not present

## 2020-09-20 DIAGNOSIS — M47812 Spondylosis without myelopathy or radiculopathy, cervical region: Secondary | ICD-10-CM | POA: Diagnosis not present

## 2020-09-20 DIAGNOSIS — F4321 Adjustment disorder with depressed mood: Secondary | ICD-10-CM | POA: Diagnosis not present

## 2020-09-20 DIAGNOSIS — E119 Type 2 diabetes mellitus without complications: Secondary | ICD-10-CM | POA: Diagnosis not present

## 2020-09-20 DIAGNOSIS — M19041 Primary osteoarthritis, right hand: Secondary | ICD-10-CM | POA: Diagnosis not present

## 2020-09-20 DIAGNOSIS — E7800 Pure hypercholesterolemia, unspecified: Secondary | ICD-10-CM | POA: Diagnosis not present

## 2020-09-20 DIAGNOSIS — E1169 Type 2 diabetes mellitus with other specified complication: Secondary | ICD-10-CM | POA: Diagnosis not present

## 2020-09-21 ENCOUNTER — Other Ambulatory Visit: Payer: Self-pay | Admitting: *Deleted

## 2020-09-21 ENCOUNTER — Ambulatory Visit: Payer: Self-pay

## 2020-09-21 ENCOUNTER — Encounter: Payer: Self-pay | Admitting: *Deleted

## 2020-09-21 NOTE — Patient Outreach (Signed)
[facility]) Care Management  09/21/2020  ARBUTUS NELLIGAN [birth year] [id number]  CSW was able to make initial contact with patient today to perform phone assessment, as well as assess and assist with social work needs and services.  CSW introduced self, explained role and types of services provided through [place] [facility] ([place] [facility]).  CSW further explained to patient that CSW works with Thea Silversmith, patient's Chronic Special Needs Plan Coordinator, also with [facility] Care Management.  CSW then explained the reason for the call, indicating that Ms. [name] thought that patient would benefit from social work services and resources to assist with counseling and supportive services for symptoms of depression, as well as grief and loss counseling.  CSW obtained two HIPAA compliant identifiers from patient, which included patient's name and date of birth.  Patient admitted that she really has not had an opportunity to grieve the lose of her deceased husband, who died in 2020-04-03.  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  Patient reported that she spoke with the director, as well as the clinical manager, both at [facility], just yesterday (Tuesday, September 20, 2020), to explain to them that she will be taking a leave of absence.  Patient indicated that she has been a Equities trader for 44 years, so she believes it is time to step away for a little bit, or at the very least, significantly reduce her hours of employment.  Patient stated that she is ready to enjoy her life, after having worked so hard for so many years.  Patient reported that she attends Grief Share at her church every Tuesday evening for 2 hours, with Mathis Fare, and has been since the death of her husband.  Patient admitted that she is really enjoying  the support group, has learned a great deal about the stages of grief, and has met some wonderful people.  Patient verbalized that she has a great support system, through family members, friends, neighbors, congregation members, Grief Share members, etc.  Patient also attends church on a regular basis and rely's on God and her Faith to help her through difficult situations in her life.  Patient has a daughter that lives in [place], [place] with her family, as well as a son that lives in [place], [place] with his family.  Patient indicated that they call to check on her on a regular basis and that they try to visit as often as possible.    Patient is aware that stress and anxiety can trigger a spike in her blood sugars and blood pressure, as she is working very hard to try and get both of these under control.  Patient admitted that she was married to her husband for 21 years, but only recently learned that he has 8 children that he failed to mention to her.  Patient is now working with an Mudlogger to try and secure all of her assets.  CSW offered to provide counseling and supportive services to patient to try and help reduce symptoms of anxiety and depression, as well as provide grief and loss counseling.  Patient reported that she would give it some consideration, but that she was definitely not interested in grief and loss counseling, as she is already receiving these types of services.  Patient admitted that she would  really like to meet with a psychiatrist/therapist face-to-face, as she does not believe that it will be beneficial for her to receive telephonic services.  CSW voiced understanding, but explained to patient that telephonic counseling is the only option that CSW can provide to her at this time, as all home visits have been restricted due to COVID-19.  CSW agreed to mail patient a list of counseling resources, as well as EMMI educational material for her personal review and  education.  CSW will then follow-up with patient again next week, on Thursday, September 29, 2020, around 11:00am, to ensure that she received the information mailed, assist with the referral process, and/or begin providing telephonic counseling services.  Nat Christen, BSW, MSW, LCSW  Licensed Education officer, environmental Health System  Mailing [place] [address], [place], [place] [address] Physical Address-[address], [place], [place] [address] Toll Free Main # [phone] Fax # [phone] Cell # [phone]  [name]

## 2020-09-29 ENCOUNTER — Other Ambulatory Visit: Payer: Self-pay | Admitting: *Deleted
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
## 2020-10-06 ENCOUNTER — Other Ambulatory Visit: Payer: Self-pay | Admitting: *Deleted

## 2020-10-06 NOTE — Patient Outreach (Signed)
[facility]) Care Management  10/06/2020  Jennifer [name] [birth year] [id number]   CSW made an attempt to try and contact patient today to follow-up regarding social work services and resources, as well as to provide counseling and supportive services for symptoms of anxiety and depression; however, patient was unavailable at the time of CSW's call.  CSW left HIPAA compliant messages on voicemail for patient, on her home number and her mobile number, and will await a return call.  If CSW does not receive a return call from patient within the next 3-4 business days, CSW will make a second outreach attempt.  Danford Bad, BSW, MSW, LCSW  Licensed Restaurant manager, fast food Health System  Mailing [place] [address], [place], [place] [address] Physical Address-[address] [place], [place] [address] Toll Free Main # [phone] Fax # [phone] Cell # [phone]  [email or web address]

## 2020-10-11 DIAGNOSIS — E7800 Pure hypercholesterolemia, unspecified: Secondary | ICD-10-CM | POA: Diagnosis not present

## 2020-10-11 DIAGNOSIS — E1169 Type 2 diabetes mellitus with other specified complication: Secondary | ICD-10-CM | POA: Diagnosis not present

## 2020-10-11 DIAGNOSIS — K219 Gastro-esophageal reflux disease without esophagitis: Secondary | ICD-10-CM | POA: Diagnosis not present

## 2020-10-11 DIAGNOSIS — I10 Essential (primary) hypertension: Secondary | ICD-10-CM | POA: Diagnosis not present

## 2020-10-11 DIAGNOSIS — E119 Type 2 diabetes mellitus without complications: Secondary | ICD-10-CM | POA: Diagnosis not present

## 2020-10-11 DIAGNOSIS — F4321 Adjustment disorder with depressed mood: Secondary | ICD-10-CM | POA: Diagnosis not present

## 2020-10-11 DIAGNOSIS — M19041 Primary osteoarthritis, right hand: Secondary | ICD-10-CM | POA: Diagnosis not present

## 2020-10-11 DIAGNOSIS — J4520 Mild intermittent asthma, uncomplicated: Secondary | ICD-10-CM | POA: Diagnosis not present

## 2020-10-11 DIAGNOSIS — M189 Osteoarthritis of first carpometacarpal joint, unspecified: Secondary | ICD-10-CM | POA: Diagnosis not present

## 2020-10-11 DIAGNOSIS — M47812 Spondylosis without myelopathy or radiculopathy, cervical region: Secondary | ICD-10-CM | POA: Diagnosis not present

## 2020-10-11 DIAGNOSIS — M47816 Spondylosis without myelopathy or radiculopathy, lumbar region: Secondary | ICD-10-CM | POA: Diagnosis not present

## 2020-10-12 ENCOUNTER — Other Ambulatory Visit: Payer: Self-pay | Admitting: *Deleted

## 2020-10-12 NOTE — Patient Outreach (Signed)
[facility]) Care Management  10/12/2020  Jennifer [name] 10-26-1951 [id number]   CSW was able to make contact with patient today to follow-up regarding social work services and resources, as well as to provide counseling and supportive services for symptoms of Anxiety and Depression.  Patient reported that she would not be able to talk long today, as she was preparing for a virtual zoom support group meeting at the time of CSW's call.  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  Patient admitted that she feels like a huge weight has been lifted off of her shoulders, "changing her whole outlook on life".  Patient stated that she now plans to volunteer more for her church and is in the process of submitting her application for the chaplancy program.    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  Patient admitted that for the remainder of her time on earth, "she is committed to serving the Lord and allowing Him to use her as He see's fit".  Patient indicated that she no longer feels the need for CSW to contact her on a weekly basis, encouraging CSW to follow-up with her again in one month, to check her status.  CSW voiced understanding and was agreeable to this plan, explaining to patient that CSW will contact her again in a little over 4 weeks, on Friday, November 11, 2020, around 9:00 AM.  Patient reported that she was most appreciative of all services provided to her by [facility] Care Management, thus far.  Danford Bad, BSW, MSW, LCSW  Licensed Restaurant manager, fast food Health System  Mailing [place] [address] [place], [place] [address] Physical Address-[address],  [place], [place] [address] Toll Free Main # [phone] Fax # [phone] Cell # [phone]  [email or web address]

## 2020-11-09 DIAGNOSIS — Z0000 Encounter for general adult medical examination without abnormal findings: Secondary | ICD-10-CM | POA: Diagnosis not present

## 2020-11-11 ENCOUNTER — Other Ambulatory Visit: Payer: Self-pay | Admitting: *Deleted

## 2020-11-11 ENCOUNTER — Encounter: Payer: Self-pay | Admitting: *Deleted

## 2020-11-11 NOTE — Patient Outreach (Signed)
[facility]) Care Management  11/11/2020  SADEY YANDELL 1951/04/08 [id number]   CSW was able to make contact with patient today to follow-up regarding social work services and resources, as well as to provide counseling and supportive services for symptoms of Anxiety and Depression.  Patient stated, "I am doing really well, enjoying retired life and taking time for myself".  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  Patient went on to explain that she is trying to stay active, eat healthier and spend more time with family members and friends.  Patient indicated that she will be spending Thanksgiving with her mother, and a few other family members, in [place].  CSW explained to patient that CSW is glad that patient will not be spending the holiday alone.  Patient then reported that she recently ordered a Christmas arrangement for her deceased husband's gravesite, and plans to visit the cemetary one day next week to pay her respects.  CSW reminded patient to continue utilizing her positive coping mechanisms when she becomes anxious or depressed, such as meditation, deep breathing exercises and relaxation techniques.  Patient verbalized understanding.  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  Patient admitted that she was extremely appreciative of CSW contacting her today, especially before the holiday next week, to check in with her, as well as to provide supportive services.  Patient denied the need for continued social work involvement, with regards to CSW providing telephonic counseling services, indicating that she is now in a "much better place".   CSW reminded patient of the list of counseling resources that CSW provided to her, in addition to the [facility] Educational material regarding Anxiety and Depression, encouraging her to refer back to these resources, if needed.   CSW will perform a case closure on patient, as all goals of treatment have been met from social work standpoint and no additional social work needs have been identified at this time.  CSW will notify patient's Chronic Special Needs Plan RNCM with [facility], Thea Silversmith of CSW's plans to close patient's case.  CSW will fax an update to patient's Primary Care Physician, Dr. Lona Kettle to ensure that he is aware of CSW's involvement with patient's plan of care, in addition to routing a Physician Case Closure Letter.  CSW was able to confirm that patient has the correct contact information for CSW, encouraging her to contact CSW directly if she changes her mind about wanting to receive counseling services, or if additional social work needs arise in the near future.    Nat Christen, BSW, MSW, LCSW  Licensed Education officer, environmental Health System  Mailing [place] [address] [place], [place] [address] Physical Address-[address] [place], [place] [address] Toll Free Main # [phone] Fax # [phone] Cell # [phone]  [name]

## 2020-11-15 ENCOUNTER — Encounter: Payer: Self-pay | Admitting: *Deleted

## 2020-11-16 ENCOUNTER — Ambulatory Visit: Payer: HMO | Admitting: Neurology

## 2020-11-16 ENCOUNTER — Telehealth: Payer: Self-pay | Admitting: Neurology

## 2020-11-16 ENCOUNTER — Other Ambulatory Visit: Payer: Self-pay

## 2020-11-16 ENCOUNTER — Encounter: Payer: Self-pay | Admitting: Neurology

## 2020-11-16 VITALS — BP 128/78 | HR 87 | Ht 60.0 in | Wt 156.0 lb

## 2020-11-16 DIAGNOSIS — E538 Deficiency of other specified B group vitamins: Secondary | ICD-10-CM

## 2020-11-16 DIAGNOSIS — R6889 Other general symptoms and signs: Secondary | ICD-10-CM | POA: Diagnosis not present

## 2020-11-16 DIAGNOSIS — R410 Disorientation, unspecified: Secondary | ICD-10-CM

## 2020-11-16 DIAGNOSIS — R400 Somnolence: Secondary | ICD-10-CM | POA: Diagnosis not present

## 2020-11-16 DIAGNOSIS — R413 Other amnesia: Secondary | ICD-10-CM

## 2020-11-16 DIAGNOSIS — R5383 Other fatigue: Secondary | ICD-10-CM

## 2020-11-16 NOTE — Telephone Encounter (Signed)
health team order sent to GI. No auth they will reach out to the patient to schedule.  

## 2020-11-16 NOTE — Patient Instructions (Signed)
MRI brain Sleep study Blood work today  Ecologist Strategies  1. Use "WARM" strategy.  W= write it down  A= associate it  R= repeat it  M= make a mental note  2.   You can keep a Glass blower/designer.  Use a 3-ring notebook with sections for the following: calendar, important names and phone numbers,  medications, doctors' names/phone numbers, lists/reminders, and a section to journal what you did  each day.   3.    Use a calendar to write appointments down.  4.    Write yourself a schedule for the day.  This can be placed on the calendar or in a separate section of the Memory Notebook.  Keeping a  regular schedule can help memory.  5.    Use medication organizer with sections for each day or morning/evening pills.  You may need help loading it  6.    Keep a basket, or pegboard by the door.  Place items that you need to take out with you in the basket or on the pegboard.  You may also want to  include a message board for reminders.  7.    Use sticky notes.  Place sticky notes with reminders in a place where the task is performed.  For example: " turn off the  stove" placed by the stove, "lock the door" placed on the door at eye level, " take your medications" on  the bathroom mirror or by the place where you normally take your medications.  8.    Use alarms/timers.  Use while cooking to remind yourself to check on food or as a reminder to take your medicine, or as a  reminder to make a call, or as a reminder to perform another task, etc.

## 2020-11-16 NOTE — Progress Notes (Signed)
[facility]    Provider:  Dr Lucia Gaskins Requesting Provider: Daisy Floro, MD Primary Care Provider:  Daisy Floro, MD  CC:  Memory changes  HPI:  Jennifer [name] is a 69 y.o. female here as requested by Daisy Floro, MD for memory changes. PMHx HTN, DM2, HLD, osteoarthritis, back pain, kidney stones, drug-induced myopathy and myalgia, carpal tunnel syndrome, memory changes, grief reaction. I reviewed Dr. Charlott Rakes notes: Patient feels tired, poor sleep, stressed, now on prednisone and her A1c is up to 7.28, no recent lorazepam use, she is worried about her memory she is gotten lost a few times and she is very tired a lot, B12 and thyroid were checked, vitamin D, lipid panel were also checked, vitamin D was normal 62.6, CMP showed creatinine 0.62 and BUN 11 otherwise unremarkable labs were drawn 2020-09-07, B12 was low at 219 we will recheck. TSH normal.   Patient reports ongoing for 2 years, worsening, patient always has to use her GPS, it was worse several months ago. She is leaving things in places. She has a lot of stress the last few years, forgetting things, simple things, getting worse, she needs a nap every day, husband died this year and there is grief, even cooking she is forgetting, to prepare a big meal is difficult when she cooks for people, she has to write things down, ongoing 2 years, no hx of dementi ain the family that she knows of, her bills are paid, she has to use her GPS, she has to think where she is but not getting lost. No weakness, no sensory changes, husband had dementia and she was his caregiver. No other focal neurologic deficits, associated symptoms, inciting events or modifiable factors.  Reviewed notes, labs and imaging from outside physicians, which showed: see above  Review of Systems: Patient complains of symptoms per HPI as well as the following symptoms: stress, fatigue, back pain. Pertinent negatives and positives per HPI. All  others negative.   Social History   Socioeconomic History  . Marital status: Widowed    Spouse name: Not on file  . Number of children: 2  . Years of education: 55  . Highest education level: Bachelor's degree (e.g., BA, AB, BS)  Occupational History  . Occupation: Part-Time at [employer]  . Smoking status: Never Smoker  . Smokeless tobacco: Never Used  Vaping Use  . Vaping Use: Never used  Substance and Sexual Activity  . Alcohol use: No  . Drug use: No  . Sexual activity: Not Currently  Other Topics Concern  . Not on file  Social History Narrative   Retired recently as a Engineer, civil (consulting)   Right handed   Caffeine: decaf coffee occasionally    Lives at home alone   Social Determinants of Health   Financial Resource Strain: Low Risk   . Difficulty of Paying Living Expenses: Not very hard  Food Insecurity: No Food Insecurity  . Worried About Programme researcher, broadcasting/film/video in the Last Year: Never true  . Ran Out of Food in the Last Year: Never true  Transportation Needs: No Transportation Needs  . Lack of Transportation (Medical): No  . Lack of Transportation (Non-Medical): No  Physical Activity: Inactive  . Days of Exercise per Week: 0 days  . Minutes of Exercise per Session: 0 min  Stress: No Stress Concern Present  . Feeling of Stress : Only a little  Social Connections: Moderately Isolated  . Frequency of Communication with Friends  and Family: More than three times a week  . Frequency of Social Gatherings with Friends and Family: More than three times a week  . Attends Religious Services: More than 4 times per year  . Active Member of Clubs or Organizations: No  . Attends Banker Meetings: Never  . Marital Status: Widowed  Intimate Partner Violence: Not At Risk  . Fear of Current or Ex-Partner: No  . Emotionally Abused: No  . Physically Abused: No  . Sexually Abused: No    Family History  Problem Relation Age of Onset  . Diabetes Mother        DM  .  Hypertension Father   . Diabetes Father        DM  . Heart attack Brother   . CAD Brother   . Heart disease Paternal Grandfather   . Diabetes Brother        DM  . Heart attack Brother   . CAD Brother   . Lupus Other        Family H/O of Lupus  . Breast cancer Cousin        unsure of age  . Dementia Neg Hx   . Alzheimer's disease Neg Hx     Past Medical History:  Diagnosis Date  . Carpal tunnel syndrome on both sides    left worse than right  . Degenerative arthritis of cervical spine   . Depressive disorder, not elsewhere classified   . Family history of heart disease    with sudden death  . GERD (gastroesophageal reflux disease)   . History of asthma    no longer requires med., per pt.  . History of kidney stones   . Hypercholesteremia   . Hyperlipidemia   . Hypertension    states under control with meds., has been on med. since 1990s  . Immature cataract of both eyes   . Memory difficulty   . Non-insulin dependent type 2 diabetes mellitus (HCC)   . PAT (paroxysmal atrial tachycardia) (HCC)   . 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 Sickle cell trait (HCC)   . Vitamin D deficiency     Patient Active Problem List   Diagnosis Date Noted  . Abnormal EKG 07/27/2014  . Essential hypertension 07/27/2014  . Diabetes mellitus type 2, uncontrolled, with complications (HCC) 07/27/2014  . Hyperlipidemia 07/27/2014  . History of PSVT (paroxysmal supraventricular tachycardia) 07/27/2014    Past Surgical History:  Procedure Laterality Date  . BREAST LUMPECTOMY WITH RADIOACTIVE SEED LOCALIZATION Left 01/30/2018   Procedure: LEFT BREAST BRACKETED LUMPECTOMY WITH RADIOACTIVE SEED LOCALIZATION;  Surgeon: Abigail Miyamoto, MD;  Location: [facility];  Service: General;  Laterality: Left;  . CESAREAN SECTION  1982  . DILATION AND CURETTAGE OF UTERUS    . ENDOMETRIAL ABLATION  1991  . TUBAL LIGATION  1982    Current Outpatient Medications  Medication  Sig Dispense Refill  . albuterol (VENTOLIN HFA) 108 (90 Base) MCG/ACT inhaler Inhale 1-2 puffs into the lungs every 6 (six) hours as needed for wheezing or shortness of breath.    Marland Kitchen aspirin EC 81 MG tablet Take 81 mg by mouth daily.    . carvedilol (COREG) 6.25 MG tablet Take 6.25 mg by mouth 2 (two) times daily with a meal.    . Continuous Blood Gluc Sensor (FREESTYLE LIBRE SENSOR SYSTEM) MISC by Does not apply route.    . diphenhydramine-acetaminophen (TYLENOL PM) 25-500 MG TABS tablet Take 1 tablet by  mouth at bedtime as needed.    . Dulaglutide (TRULICITY) 1.5 MG/0.5ML SOPN Inject into the skin once a week. TUESDAY    . Fexofenadine HCl (ALLEGRA PO) Take 180 mg by mouth daily. Takes for a month- alternates with Claritin each month.     . fluticasone (FLONASE) 50 MCG/ACT nasal spray Place 1 spray into both nostrils daily.    Marland Kitchen. glimepiride (AMARYL) 2 MG tablet Take 2 mg by mouth daily with supper.    . lansoprazole (PREVACID) 30 MG capsule Take 30 mg by mouth daily before supper.     Marland Kitchen. LORazepam (ATIVAN) 0.5 MG tablet Take 0.5 mg by mouth every 8 (eight) hours as needed for anxiety.    . metFORMIN (GLUCOPHAGE) 1000 MG tablet Take 1 tablet by mouth daily with breakfast.     . Ondansetron HCl (ZOFRAN PO) Take 1 tablet by mouth every 6 (six) hours as needed.    Marland Kitchen. telmisartan-hydrochlorothiazide (MICARDIS HCT) 80-12.5 MG tablet Take 1 tablet by mouth daily.    . verapamil (CALAN-SR) 240 MG CR tablet Take 1 tablet by mouth daily.     No current facility-administered medications for this visit.    Allergies as of 11/16/2020 - Review Complete 11/16/2020  Allergen Reaction Noted  . Accupril [quinapril hcl] Other (See Comments) and Cough 07/27/2014  . Penicillins Hives 07/27/2014  . Quinapril hcl Other (See Comments) 07/27/2014  . Cephalosporins Hives and Itching 04/14/2019  . Nitroglycerin Rash 01/23/2018    Vitals: BP 128/78 (BP Location: Left Arm, Patient Position: Sitting)   Pulse 87    Ht 5' (1.524 m)   Wt 156 lb (70.8 kg)   BMI 30.47 kg/m  Last Weight:  Wt Readings from Last 1 Encounters:  11/16/20 156 lb (70.8 kg)   Last Height:   Ht Readings from Last 1 Encounters:  11/16/20 5' (1.524 m)     Physical exam: Exam: Gen: NAD, appears depressed and very tired, well nourised, obese, well groomed                     CV: RRR, no MRG. No Carotid Bruits. No peripheral edema, warm, nontender Eyes: Conjunctivae clear without exudates or hemorrhage  Neuro: Detailed Neurologic Exam  Speech:    Speech is normal; fluent and spontaneous with normal comprehension.  Cognition:  MMSE - Mini Mental State Exam 11/16/2020  Orientation to time 4  Orientation to Place 4  Registration 3  Attention/ Calculation 0  Recall 2  Language- name 2 objects 2  Language- repeat 0  Language- follow 3 step command 3  Language- read & follow direction 1  Write a sentence 1  Copy design 0  Total score 20       The patient is oriented to person, place, and time;     recent and remote memory intact;     language fluent;     normal attention, concentration,     fund of knowledge Cranial Nerves:    The pupils are equal, round, and reactive to light.pupils too small to visualize fundi. . Visual fields are full to finger confrontation. Extraocular movements are intact. Trigeminal sensation is intact and the muscles of mastication are normal. The face is symmetric. The palate elevates in the midline. Hearing intact. Voice is normal. Shoulder shrug is normal. The tongue has normal motion without fasciculations.   Coordination:    No dysmetria or ataxia  Gait:  normal native gait  Motor Observation:    No asymmetry,  no atrophy, and no involuntary movements noted. Tone:    Normal muscle tone.    Posture:    Posture is normal. normal erect    Strength: right prox leg weakness due to pain otherwise strength is V/V in the upper and lower limbs.      Sensation: intact to LT      Reflex Exam:  DTR's:    Deep tendon reflexes in the upper and lower extremities are normal symmetrical .   Toes:    The toes are downgoing bilaterally.   Clonus:    Clonus is absent.    Assessment/Plan:  69 y.o. female here as requested by Daisy Floro, MD for memory changes. PMHx HTN, DM2, HLD, osteoarthritis, back pain, kidney stones, drug-induced myopathy and myalgia, carpal tunnel syndrome, memory changes, grief reaction  I suspect symptoms are multifactorial as patient has a lot of stress, grief(husband died he had dementia), she reports being very tired and snoring and needing naps, we will order blood work, check an MRI of the brain to evaluate for reversible causes of dementia and proceed to sleep evaluation. If she has sleep apnea will need to treat that for 4-6 months and see how she does. If she still feels as though memory is worsening at next appointment will order formal neurocognitive testing.   Orders Placed This Encounter  Procedures  . MR BRAIN W WO CONTRAST  . B12 and Folate Panel  . Methylmalonic acid, serum  . Homocysteine  . CBC with Differential/Platelets  . Basic Metabolic Panel  . Ambulatory referral to Sleep Studies     Cc: Daisy Floro, MD,    Naomie Dean, MD  [facility] [address] [place], [place] [address]  Phone [phone] Fax [phone]

## 2020-11-17 LAB — CBC WITH DIFFERENTIAL/PLATELET: Immature Grans (Abs): 0 10*3/uL (ref 0.0–0.1)

## 2020-11-17 LAB — METHYLMALONIC ACID, SERUM

## 2020-11-17 LAB — B12 AND FOLATE PANEL: Folate: 18.5 ng/mL (ref >3.0)

## 2020-11-18 LAB — CBC WITH DIFFERENTIAL/PLATELET
Eos: 2 %
Lymphocytes Absolute: 2.4 10*3/uL (ref 0.7–3.1)
MCHC: 34.1 g/dL (ref 31.5–35.7)

## 2020-11-21 ENCOUNTER — Telehealth: Payer: Self-pay

## 2020-11-21 DIAGNOSIS — M19041 Primary osteoarthritis, right hand: Secondary | ICD-10-CM | POA: Diagnosis not present

## 2020-11-21 DIAGNOSIS — M47816 Spondylosis without myelopathy or radiculopathy, lumbar region: Secondary | ICD-10-CM | POA: Diagnosis not present

## 2020-11-21 DIAGNOSIS — E119 Type 2 diabetes mellitus without complications: Secondary | ICD-10-CM | POA: Diagnosis not present

## 2020-11-21 DIAGNOSIS — E7800 Pure hypercholesterolemia, unspecified: Secondary | ICD-10-CM | POA: Diagnosis not present

## 2020-11-21 DIAGNOSIS — E1169 Type 2 diabetes mellitus with other specified complication: Secondary | ICD-10-CM | POA: Diagnosis not present

## 2020-11-21 DIAGNOSIS — J4520 Mild intermittent asthma, uncomplicated: Secondary | ICD-10-CM | POA: Diagnosis not present

## 2020-11-21 DIAGNOSIS — K219 Gastro-esophageal reflux disease without esophagitis: Secondary | ICD-10-CM | POA: Diagnosis not present

## 2020-11-21 DIAGNOSIS — M47812 Spondylosis without myelopathy or radiculopathy, cervical region: Secondary | ICD-10-CM | POA: Diagnosis not present

## 2020-11-21 DIAGNOSIS — F4321 Adjustment disorder with depressed mood: Secondary | ICD-10-CM | POA: Diagnosis not present

## 2020-11-21 DIAGNOSIS — M189 Osteoarthritis of first carpometacarpal joint, unspecified: Secondary | ICD-10-CM | POA: Diagnosis not present

## 2020-11-21 DIAGNOSIS — I10 Essential (primary) hypertension: Secondary | ICD-10-CM | POA: Diagnosis not present

## 2020-11-21 NOTE — Telephone Encounter (Signed)
-----   Message from Anson Fret, MD sent at 11/18/2020 12:01 PM EST ----- (I sent patient this message but pease call and make sure she read it and I have forwarded results to Dr. Tenny Craw thanks) Jennifer [name], you are B12 deficient which can cause serious problems. B12 deficiency can cause memory problems, weakness, fatigue, easy bruising or bleeding,sore tongue, stomach upset, weight loss, and diarrhea or constipation, tingling or numbness to the fingers and toes, difficulty walking, mood changes, depression, disorientation and, in severe cases, dementia. I recommend that you get B12 injections for 6 months and then take B12 in a pill after that. Usually we give you a b12 injection weekly for 4 weeks and then monthly for 5 more months and then ask you to take oral B12 possibly for life. You can get the hots in our office or with Dr. Tenny Craw. We will call you on Monday to discuss. In the meantime you can buy 1000-2078mcg B12 pill from the store and start taking one every day. Thanks, Dr. Lucia Gaskins

## 2020-11-21 NOTE — Telephone Encounter (Signed)
Pt verified by name and DOB,  results given per provider,  Pt states she is not interested in B12 injections at this time and wishes to start B12 Pills daily,  Per Dr Lucia Gaskins pt instructed to take 1000-2000 mcg B12 po daily and fu with PCP for repeat blood work in 3 months.  Pt voiced understanding all question answered.  results faxed to PCP and mailed to pt.

## 2020-11-21 NOTE — Telephone Encounter (Signed)
err

## 2020-11-23 ENCOUNTER — Other Ambulatory Visit: Payer: Self-pay

## 2020-11-23 ENCOUNTER — Ambulatory Visit
Admission: RE | Admit: 2020-11-23 | Discharge: 2020-11-23 | Disposition: A | Discharge status: Home / Self Care | Payer: HMO | Source: Ambulatory Visit | Attending: Neurology | Admitting: Neurology

## 2020-11-23 DIAGNOSIS — R6889 Other general symptoms and signs: Secondary | ICD-10-CM

## 2020-11-23 DIAGNOSIS — R410 Disorientation, unspecified: Secondary | ICD-10-CM

## 2020-11-23 DIAGNOSIS — R413 Other amnesia: Secondary | ICD-10-CM | POA: Diagnosis not present

## 2020-11-23 MED ORDER — GADOBENATE DIMEGLUMINE 529 MG/ML IV SOLN
13.0000 mL | Freq: Once | INTRAVENOUS | Status: AC | PRN
  Administered 2020-11-23: 13 mL via INTRAVENOUS

## 2020-11-24 LAB — BASIC METABOLIC PANEL
BUN/Creatinine Ratio: 11 — ABNORMAL LOW (ref 12–28)
BUN: 8 mg/dL (ref 8–27)
CO2: 28 mmol/L (ref 20–29)
Calcium: 9.5 mg/dL (ref 8.7–10.3)
Chloride: 103 mmol/L (ref 96–106)
Creatinine, Ser: 0.76 mg/dL (ref 0.57–1.00)
GFR calc Af Amer: 95 mL/min/{1.73_m2} (ref >59)
GFR calc non Af Amer: 82 mL/min/{1.73_m2} (ref >59)
Glucose: 147 mg/dL — ABNORMAL HIGH (ref 65–99)
Potassium: 3.5 mmol/L (ref 3.5–5.2)
Sodium: 143 mmol/L (ref 134–144)

## 2020-11-24 LAB — CBC WITH DIFFERENTIAL/PLATELET
Basophils Absolute: 0 10*3/uL (ref 0.0–0.2)
Basos: 1 %
EOS (ABSOLUTE): 0.1 10*3/uL (ref 0.0–0.4)
Hematocrit: 41.3 % (ref 34.0–46.6)
Hemoglobin: 14.1 g/dL (ref 11.1–15.9)
Immature Granulocytes: 0 %
Lymphs: 38 %
MCH: 29.4 pg (ref 26.6–33.0)
MCV: 86 fL (ref 79–97)
Monocytes Absolute: 0.4 10*3/uL (ref 0.1–0.9)
Monocytes: 7 %
Neutrophils Absolute: 3.2 10*3/uL (ref 1.4–7.0)
Neutrophils: 52 %
Platelets: 320 10*3/uL (ref 150–450)
RBC: 4.79 x10E6/uL (ref 3.77–5.28)
RDW: 13.3 % (ref 11.7–15.4)
WBC: 6.2 10*3/uL (ref 3.4–10.8)

## 2020-11-24 LAB — HOMOCYSTEINE: Homocysteine: 10.4 umol/L (ref 0.0–17.2)

## 2020-11-24 LAB — METHYLMALONIC ACID, SERUM: Methylmalonic Acid: 168 nmol/L (ref 0–378)

## 2020-11-24 LAB — B12 AND FOLATE PANEL: Vitamin B-12: 172 pg/mL — ABNORMAL LOW (ref 232–1245)

## 2020-11-29 ENCOUNTER — Telehealth: Payer: Self-pay | Admitting: *Deleted

## 2020-11-29 ENCOUNTER — Encounter: Payer: Self-pay | Admitting: *Deleted

## 2020-11-29 NOTE — Telephone Encounter (Signed)
I spoke with the patient and discussed the results from Dr Lucia Gaskins regarding MRI brain. The patient verbalized understanding and her questions were answered. Pt verbalized appreciation for the call.

## 2020-11-29 NOTE — Telephone Encounter (Signed)
-----   Message from Anson Fret, MD sent at 11/28/2020  2:02 PM EST ----- MRI of the brain showed some findings that are not uncommon in aging such as brain volume loss and chronic small vessel changes both commonly seen in aging. However we did see a very small area that may be an old stroke, unknown when it may have happened could have been years prior, I would just continue to take daily aspirin and control cholesterol and high blood pressure and diabetes if those are present. We can discuss at follow up.

## 2020-12-02 DIAGNOSIS — J4520 Mild intermittent asthma, uncomplicated: Secondary | ICD-10-CM | POA: Diagnosis not present

## 2020-12-02 DIAGNOSIS — F4321 Adjustment disorder with depressed mood: Secondary | ICD-10-CM | POA: Diagnosis not present

## 2020-12-02 DIAGNOSIS — I10 Essential (primary) hypertension: Secondary | ICD-10-CM | POA: Diagnosis not present

## 2020-12-02 DIAGNOSIS — E7800 Pure hypercholesterolemia, unspecified: Secondary | ICD-10-CM | POA: Diagnosis not present

## 2020-12-02 DIAGNOSIS — M189 Osteoarthritis of first carpometacarpal joint, unspecified: Secondary | ICD-10-CM | POA: Diagnosis not present

## 2020-12-02 DIAGNOSIS — M19041 Primary osteoarthritis, right hand: Secondary | ICD-10-CM | POA: Diagnosis not present

## 2020-12-02 DIAGNOSIS — E1169 Type 2 diabetes mellitus with other specified complication: Secondary | ICD-10-CM | POA: Diagnosis not present

## 2020-12-02 DIAGNOSIS — K219 Gastro-esophageal reflux disease without esophagitis: Secondary | ICD-10-CM | POA: Diagnosis not present

## 2020-12-02 DIAGNOSIS — M47812 Spondylosis without myelopathy or radiculopathy, cervical region: Secondary | ICD-10-CM | POA: Diagnosis not present

## 2020-12-02 DIAGNOSIS — M47816 Spondylosis without myelopathy or radiculopathy, lumbar region: Secondary | ICD-10-CM | POA: Diagnosis not present

## 2020-12-06 ENCOUNTER — Other Ambulatory Visit: Payer: Self-pay

## 2020-12-06 NOTE — Patient Outreach (Signed)
  [facility]) Care Management Chronic Special Needs Program    12/06/2020  Name: Jennifer [name], DOB: [birth year]  MRN: [id number]   Ms. Jennifer [name] is enrolled in a chronic special needs plan for Diabetes. [facility] Care Management will continue to provide services for this member through 12/23/2020. The [facility] Care Management Team will assume care 12/24/2020.   Kathyrn Sheriff, RN, MSN, [facility] Chronic Care Management Coordinator [facility] [phone]

## 2020-12-27 ENCOUNTER — Ambulatory Visit: Payer: HMO | Admitting: Neurology

## 2020-12-27 ENCOUNTER — Encounter: Payer: Self-pay | Admitting: Neurology

## 2020-12-27 VITALS — BP 132/81 | HR 79 | Ht 60.0 in | Wt 160.0 lb

## 2020-12-27 DIAGNOSIS — R400 Somnolence: Secondary | ICD-10-CM

## 2020-12-27 DIAGNOSIS — R5383 Other fatigue: Secondary | ICD-10-CM | POA: Insufficient documentation

## 2020-12-27 DIAGNOSIS — G4726 Circadian rhythm sleep disorder, shift work type: Secondary | ICD-10-CM

## 2020-12-27 DIAGNOSIS — I10 Essential (primary) hypertension: Secondary | ICD-10-CM

## 2020-12-27 DIAGNOSIS — R9431 Abnormal electrocardiogram [ECG] [EKG]: Secondary | ICD-10-CM

## 2020-12-27 NOTE — Progress Notes (Signed)
SLEEP MEDICINE CLINIC    Provider:  Melvyn Novas, MD  Primary Care Physician:  Daisy Floro, MD [address] [place] [address]     Referring Provider: Dr Lucia Gaskins, MD          Chief Complaint according to patient   Patient presents with:    . New Patient (Initial Visit)     Referred by Dr Lucia Gaskins for sleep evaluation.       HISTORY OF PRESENT ILLNESS:  Jennifer [name] is a 70 y.o. year old Black or [place] American female patient seen here as a referral on 12/27/2020 from Dr. Lucia Gaskins.   Chief concern according to patient : "My memory changed when I lost my husband, March 2021, and there was drama with is children for 2-3 years before."     I have the pleasure of seeing Jennifer [name] today, a right -handed Black or [place] American female with a possible sleep disorder.    Jennifer [name] is a 70 y.o. female here as requested by Daisy Floro, MD for memory changes. PMHx HTN, DM2, HLD, osteoarthritis, back pain, kidney stones, drug-induced myopathy and myalgia, carpal tunnel syndrome, memory changes, grief reaction. I reviewed Dr. Charlott Rakes notes: Patient feels tired, poor sleep, stressed, now on prednisone and her A1c is up to 7.28, no recent lorazepam use, she is worried about her memory she is gotten lost a few times and she is very tired a lot, B12 and thyroid were checked, vitamin D, lipid panel were also checked, vitamin D was normal 62.6, CMP showed creatinine 0.62 and BUN 11 otherwise unremarkable labs were drawn 2020-09-07, B12 was low at 219 we will recheck. TSH normal.   Patient reports ongoing for 2 years, worsening, patient always has to use her GPS, it was worse several months ago. She is leaving things in places. She has a lot of stress the last few years, forgetting things, simple things, getting worse, she needs a nap every day, husband died this year and there is grief, even cooking she is forgetting, to prepare a big meal is difficult when  she cooks for people, she has to write things down, ongoing 2 years, no hx of dementi ain the family that she knows of, her bills are paid, she has to use her GPS, she has to think where she is but not getting lost. No weakness, no sensory changes, husband had dementia and she was his caregiver. No other focal neurologic deficits, associated symptoms, inciting events or modifiable factors.    Sleep relevant medical history: see above. 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    Family medical /sleep history: no other family member on CPAP with OSA.    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  She lives in a household alone, widowed. No biological children, Pets are not present. Tobacco use: none  ETOH use ; none,  Caffeine intake in form of Coffee( /) Soda( /) Tea ( /) - less than a cup in 3 days.  No regular exercise .         Sleep habits are as follows: The patient's lunch time is between 12-2  PM, and last meal is 5-6 PM.  The patient goes to bed at 6 or at 10 PM, she is actively trying to change that.   and continues to sleep for several hours, wakes for 1-2  bathroom breaks, the first time at 3 AM.    The preferred sleep position is on her left side , with the support of 1 pillow.  Bedroom is cool, quiet and dark.  Dreams are reportedly rare. 5.30 AM is the usual rise time.  The patient wakes up spontaneously. She reports not feeling refreshed or restored in AM, with symptoms such as dry mouth , but no morning headaches , and residual fatigue.  Naps are taken routinely, lasting from 2-3 Pm and is much more refreshing than nocturnal sleep.    Review of Systems: Out of a complete 14 system review, the patient complains of only the following symptoms, and all other reviewed systems are negative.:  Fatigue, sleepiness , not sure if she is snoring, unfragmented sleep, nap time. Dreamless sleep.   Neck pain, occipital neuralgia.   Forgetting parts of conversations, needing GPS on familiar roads. She attributes to stress. She feels her memory is better than 2 years ago.      How likely are you to doze in the following situations: 0 = not likely, 1 = slight chance, 2 = moderate chance, 3 = high chance   Sitting and Reading? Watching Television? Sitting inactive in a public place (theater or meeting)? As a passenger in a car for an hour without a break? Lying down in the afternoon when circumstances permit? Sitting and talking to someone? Sitting quietly after lunch without alcohol? In a car, while stopped for a few minutes in traffic?   Total = 5/ 24 points   FSS endorsed at 45/ 63 points.   Social History   Socioeconomic History  . Marital status: Widowed    Spouse name: Not on file  . Number of children: 2  . Years of education: 1  . Highest education level: Bachelor's degree (e.g., BA, AB, BS)  Occupational History  . Occupation: Part-Time at [employer]  . Smoking status: Never Smoker  . Smokeless tobacco: Never Used  Vaping Use  . Vaping Use: Never used  Substance and Sexual Activity  . Alcohol use: No  . Drug use: No  . Sexual activity: Not Currently  Other Topics Concern  . Not on file  Social History Narrative   Retired recently as a Engineer, civil (consulting)   Right handed   Caffeine: decaf coffee occasionally    Lives at home alone   Social Determinants of Health   Financial Resource Strain: Low Risk   . Difficulty of Paying Living Expenses: Not very hard  Food Insecurity: No Food Insecurity  . Worried About Programme researcher, broadcasting/film/video in the Last Year: Never true  . Ran Out of Food in the Last Year: Never true  Transportation Needs: No Transportation Needs  . Lack of Transportation (Medical): No  . Lack of Transportation (Non-Medical): No  Physical Activity: Inactive  . Days of Exercise per Week: 0 days  . Minutes of Exercise per Session: 0 min  Stress: No  Stress Concern Present  . Feeling of Stress : Only a little  Social Connections: Moderately Isolated  . Frequency of Communication with Friends and Family: More than three times a week  . Frequency of Social Gatherings with Friends and Family: More than three times a week  . Attends Religious Services: More than 4 times per year  . Active Member of Clubs or Organizations: No  . Attends Banker Meetings: Never  . Marital Status: Widowed    Family History  Problem Relation Age of  Onset  . Diabetes Mother        DM  . Hypertension Father   . Diabetes Father        DM  . Heart attack Brother   . CAD Brother   . Heart disease Paternal Grandfather   . Diabetes Brother        DM  . Heart attack Brother   . CAD Brother   . Lupus Other        Family H/O of Lupus  . Breast cancer Cousin        unsure of age  . Dementia Neg Hx   . Alzheimer's disease Neg Hx     Past Medical History:  Diagnosis Date  . Carpal tunnel syndrome on both sides    left worse than right  . Degenerative arthritis of cervical spine   . Depressive disorder, not elsewhere classified   . Diabetes ([place])   . Family history of heart disease    with sudden death  . GERD (gastroesophageal reflux disease)   . History of asthma    no longer requires med., per pt.  . History of kidney stones   . Hypercholesteremia   . Hyperlipidemia   . Hypertension    states under control with meds., has been on med. since 1990s  . Immature cataract of both eyes   . Memory difficulty   . Non-insulin dependent type 2 diabetes mellitus ([place])   . PAT (paroxysmal atrial tachycardia) ([place])   . 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 Sickle cell trait ([place])   . Vitamin D deficiency     Past Surgical History:  Procedure Laterality Date  . BREAST LUMPECTOMY WITH RADIOACTIVE SEED LOCALIZATION Left 01/30/2018   Procedure: LEFT BREAST BRACKETED LUMPECTOMY WITH RADIOACTIVE SEED LOCALIZATION;  Surgeon: Coralie Keens, MD;  Location: [facility];  Service: General;  Laterality: Left;  . [place]  . DILATION AND CURETTAGE OF UTERUS    . ENDOMETRIAL ABLATION  1991  . TUBAL LIGATION  1982     Current Outpatient Medications on File Prior to Visit  Medication Sig Dispense Refill  . albuterol (VENTOLIN HFA) 108 (90 Base) MCG/ACT inhaler Inhale 1-2 puffs into the lungs every 6 (six) hours as needed for wheezing or shortness of breath.    Marland Kitchen aspirin EC 81 MG tablet Take 81 mg by mouth daily.    . betamethasone dipropionate (DIPROLENE) 0.05 % ointment 2 drops    . carvedilol (COREG) 6.25 MG tablet Take 6.25 mg by mouth 2 (two) times daily with a meal.    . Continuous Blood Gluc Sensor ([place]) MISC by Does not apply route.    . diphenhydramine-acetaminophen (TYLENOL PM) 25-500 MG TABS tablet Take 1 tablet by mouth at bedtime as needed.    . Dulaglutide 1.5 MG/0.5ML SOPN Inject into the skin once a week. TUESDAY    . Fexofenadine HCl (ALLEGRA PO) Take 180 mg by mouth daily. Takes for a month- alternates with Claritin each month.    . fluticasone (FLONASE) 50 MCG/ACT nasal spray Place 1 spray into both nostrils daily.    Marland Kitchen ibuprofen (ADVIL) 600 MG tablet 1 tablet with food or milk as needed    . lansoprazole (PREVACID) 30 MG capsule Take 30 mg by mouth daily before supper.     Marland Kitchen LORazepam (ATIVAN) 0.5 MG tablet Take 0.5 mg by mouth every 8 (eight) hours as needed for anxiety.    Marland Kitchen  MINERAL OIL PO once a week.    . Ondansetron HCl (ZOFRAN PO) Take 1 tablet by mouth every 6 (six) hours as needed.    Marland Kitchen telmisartan-hydrochlorothiazide (MICARDIS HCT) 80-12.5 MG tablet Take 1 tablet by mouth daily.    . verapamil (CALAN-SR) 240 MG CR tablet Take 1 tablet by mouth daily.    Marland Kitchen glimepiride (AMARYL) 2 MG tablet Take 2 mg by mouth daily with supper. (Patient not taking: Reported on 12/27/2020)    . metFORMIN (GLUCOPHAGE) 1000 MG tablet Take 1 tablet by mouth daily with  breakfast.  (Patient not taking: Reported on 12/27/2020)     No current facility-administered medications on file prior to visit.    Allergies  Allergen Reactions  . Accupril [Quinapril Hcl] Other (See Comments) and Cough    NUMBNESS AND TINGLING  . Penicillins Hives  . Quinapril Hcl Other (See Comments)  . Cephalosporins Hives and Itching  . Nitroglycerin Rash    PATCH    Physical exam:  Today's Vitals   12/27/20 1029  BP: 132/81  Pulse: 79  Weight: 160 lb (72.6 kg)  Height: 5' (1.524 m)   Body mass index is 31.25 kg/m.   Wt Readings from Last 3 Encounters:  12/27/20 160 lb (72.6 kg)  11/16/20 156 lb (70.8 kg)  01/30/18 149 lb 6.4 oz (67.8 kg)     Ht Readings from Last 3 Encounters:  12/27/20 5' (1.524 m)  11/16/20 5' (1.524 m)  01/30/18 5' (1.524 m)      General: The patient is awake, alert and appears not in acute distress. The patient is well groomed. Head: Normocephalic, atraumatic. Neck is supple. Mallampati 1,  neck circumference: 13.5 inches .  Nasal airflow is patent.  Retrognathia is not seen.  Dental status: regular  Cardiovascular:  Regular rate and cardiac rhythm by pulse,  without distended neck veins. Respiratory: Lungs are clear to auscultation.  Skin:  Without evidence of ankle edema, or rash. Trunk: The patient's posture is erect.   Neurologic exam : The patient is awake and alert, oriented to place and time.   Memory subjective described as intact.  Attention span & concentration ability appears normal.  Speech is fluent,  without  dysarthria, dysphonia or aphasia.  Mood and affect are appropriate.   Cranial nerves: no loss of smell or taste reported  Pupils are equal and briskly reactive to light. Funduscopic exam deferred..  Extraocular movements in vertical and horizontal planes were intact and without nystagmus. No Diplopia. Visual fields by finger perimetry are intact. Hearing was intact to soft voice and finger rubbing.    Facial  sensation intact to fine touch.  Facial motor strength is symmetric and tongue and uvula move midline.  Neck ROM : rotation, tilt and flexion extension were normal for age and shoulder shrug was symmetrical.    Motor exam:  Symmetric bulk, tone and ROM.   Normal tone without cog-wheeling, symmetric grip strength . Sensory:  Fine touch, pinprick and vibration were tested  and  normal. Proprioception tested in the upper extremities was normal.  Coordination: Rapid alternating movements in the fingers/hands were of normal speed.  The Finger-to-nose maneuver was intact without evidence of ataxia, dysmetria or tremor. Gait and station: Patient could rise unassisted from a seated position, walked without assistive device.  Stance is of normal width/ base and the patient turned with 3 steps.  Toe and heel walk were deferred.  Deep tendon reflexes: in the  upper and lower extremities are  symmetric and intact.  Babinski response was deferred.      After spending a total time of 45 minutes face to face and additional time for physical and neurologic examination, review of laboratory studies,  personal review of imaging studies, reports and results of other testing and review of referral information / records as far as provided in visit, I have established the following assessments:  1)  I see only few risk factors for this patient to have OSA- her Mallampati is low, her neck slender, there is no history of apnea as far as she is concerned. No insomnia.   2) Years of night shift work have not affected her ability to sleep now.  3) remote stroke, silent stroke was suspected. Essential hypertension and DM are main risk factors. Right sided corpus callosum. 4) falls in 2014-17   My Plan is to proceed with:  1) screening for sleep apnea.    I would like to thank Dr Lucia Gaskins. for allowing me to meet with and to take care of this pleasant patient.    I plan to follow up either personally or through our  NP within 3-4 month if there is a significant finding on HST/ PSG. .   CC: I will share my notes with PCP, dr. Tenny Craw..  Electronically signed by: Melvyn Novas, MD 12/27/2020 10:40 AM  [facility] Board certified by The [employer] of Sleep Medicine and Diplomate of the [employer] of Sleep Medicine. Board certified In Neurology through the ABPN, Fellow of the [employer] of Neurology. Medical Director of [facility].

## 2020-12-27 NOTE — Patient Instructions (Signed)

## 2020-12-28 ENCOUNTER — Other Ambulatory Visit: Payer: Self-pay

## 2020-12-28 DIAGNOSIS — I10 Essential (primary) hypertension: Secondary | ICD-10-CM | POA: Diagnosis not present

## 2020-12-28 DIAGNOSIS — E1169 Type 2 diabetes mellitus with other specified complication: Secondary | ICD-10-CM | POA: Diagnosis not present

## 2020-12-28 DIAGNOSIS — E559 Vitamin D deficiency, unspecified: Secondary | ICD-10-CM | POA: Diagnosis not present

## 2020-12-29 DIAGNOSIS — E1169 Type 2 diabetes mellitus with other specified complication: Secondary | ICD-10-CM | POA: Diagnosis not present

## 2020-12-29 DIAGNOSIS — F4321 Adjustment disorder with depressed mood: Secondary | ICD-10-CM | POA: Diagnosis not present

## 2020-12-29 DIAGNOSIS — J4520 Mild intermittent asthma, uncomplicated: Secondary | ICD-10-CM | POA: Diagnosis not present

## 2020-12-29 DIAGNOSIS — E7800 Pure hypercholesterolemia, unspecified: Secondary | ICD-10-CM | POA: Diagnosis not present

## 2020-12-29 DIAGNOSIS — M47812 Spondylosis without myelopathy or radiculopathy, cervical region: Secondary | ICD-10-CM | POA: Diagnosis not present

## 2020-12-29 DIAGNOSIS — M189 Osteoarthritis of first carpometacarpal joint, unspecified: Secondary | ICD-10-CM | POA: Diagnosis not present

## 2020-12-29 DIAGNOSIS — M19041 Primary osteoarthritis, right hand: Secondary | ICD-10-CM | POA: Diagnosis not present

## 2020-12-29 DIAGNOSIS — I10 Essential (primary) hypertension: Secondary | ICD-10-CM | POA: Diagnosis not present

## 2020-12-29 DIAGNOSIS — M47816 Spondylosis without myelopathy or radiculopathy, lumbar region: Secondary | ICD-10-CM | POA: Diagnosis not present

## 2020-12-29 DIAGNOSIS — K219 Gastro-esophageal reflux disease without esophagitis: Secondary | ICD-10-CM | POA: Diagnosis not present

## 2021-01-23 DIAGNOSIS — Z01419 Encounter for gynecological examination (general) (routine) without abnormal findings: Secondary | ICD-10-CM | POA: Diagnosis not present

## 2021-01-23 DIAGNOSIS — Z1231 Encounter for screening mammogram for malignant neoplasm of breast: Secondary | ICD-10-CM | POA: Diagnosis not present

## 2021-01-23 DIAGNOSIS — Z124 Encounter for screening for malignant neoplasm of cervix: Secondary | ICD-10-CM | POA: Diagnosis not present

## 2021-01-23 DIAGNOSIS — Z6830 Body mass index (BMI) 30.0-30.9, adult: Secondary | ICD-10-CM | POA: Diagnosis not present

## 2021-01-26 ENCOUNTER — Other Ambulatory Visit: Payer: Self-pay

## 2021-01-26 ENCOUNTER — Ambulatory Visit (INDEPENDENT_AMBULATORY_CARE_PROVIDER_SITE_OTHER): Payer: HMO | Admitting: Neurology

## 2021-01-26 DIAGNOSIS — R400 Somnolence: Secondary | ICD-10-CM

## 2021-01-26 DIAGNOSIS — R5383 Other fatigue: Secondary | ICD-10-CM

## 2021-01-26 DIAGNOSIS — G4710 Hypersomnia, unspecified: Secondary | ICD-10-CM | POA: Diagnosis not present

## 2021-01-26 DIAGNOSIS — G4726 Circadian rhythm sleep disorder, shift work type: Secondary | ICD-10-CM

## 2021-01-26 DIAGNOSIS — R9431 Abnormal electrocardiogram [ECG] [EKG]: Secondary | ICD-10-CM

## 2021-01-26 DIAGNOSIS — I10 Essential (primary) hypertension: Secondary | ICD-10-CM

## 2021-01-28 ENCOUNTER — Encounter: Payer: Self-pay | Admitting: Neurology

## 2021-02-02 DIAGNOSIS — M5430 Sciatica, unspecified side: Secondary | ICD-10-CM | POA: Diagnosis not present

## 2021-02-05 ENCOUNTER — Encounter: Payer: Self-pay | Admitting: Neurology

## 2021-02-05 NOTE — Progress Notes (Signed)
EKG was in keeping with normal sinus rhythm (NSR).   IMPRESSION:  1. No evidence of hypoxia, of Obstructive Sleep Apnea (OSA). 2. All arousals were spontaneous and unrelated to physiological factors. There was one prolonged period of wakefulness beginning at 1 AM until 2.30 AM. Cause at onset was neck pain and there was coughing during this period.    RECOMMENDATIONS: No physiological sleep disorder identified.  Consider cognitive behaviour therapy for insomnia.

## 2021-02-05 NOTE — Procedures (Signed)
PATIENT'S NAME:  Jennifer [name], Jennifer [name] DOB:      09/11/1951      MR#:    [id number]     DATE OF RECORDING: 01/26/2021  Christeen Douglas REFERRING M.D.:  Naomie Dean MD Study Performed:   Baseline Polysomnogram HISTORY:  Jennifer [name] was seen on 12-27-20 upon referral by Dr. Lucia Gaskins, requesting a sleep evaluation.  She reports having memory problems, culminating with the loss of her husband in March 2021 and conflict with her stepchildren. She has occipital neuralgia, Neck pain and is highly fatigued, FSS endorsed at 45/63 points.  She is a former night shift Financial controller. Has a history of remote stroke, HTN, DM, GERD.    The patient endorsed the Epworth Sleepiness Scale at 5 points.   The patient's weight 160 pounds with a height of 60 (inches), resulting in a BMI of 31.6 kg/m2. The patient's neck circumference measured 13.5 inches.   PROCEDURE:  This is a multichannel digital polysomnogram utilizing the Somnostar 11.2 system.  Electrodes and sensors were applied and monitored per AASM Specifications.   EEG, EOG, Chin and Limb EMG, were sampled at 200 Hz.  ECG, Snore and Nasal Pressure, Thermal Airflow, Respiratory Effort, CPAP Flow and Pressure, Oximetry was sampled at 50 Hz. Digital video and audio were recorded.      BASELINE STUDY: Lights Out was at 20:52 and Lights On at 04:30.  Total recording time (TRT) was 458.5 minutes, with a total sleep time (TST) of 306.5 minutes.   The patient's sleep latency was 27.5 minutes.  REM latency was 60.5 minutes.  The sleep efficiency was 66.8 %.     SLEEP ARCHITECTURE: WASO (Wake after sleep onset) was 131 minutes.  There were 35 minutes in Stage N1, 148 minutes Stage N2, 48.5 minutes Stage N3 and 75 minutes in Stage REM.  The percentage of Stage N1 was 11.4%, Stage N2 was 48.3%, Stage N3 was 15.8% and Stage R (REM sleep) was 24.5%.   RESPIRATORY ANALYSIS:  There were a total of 3 respiratory events:  0 apneas and an apnea index (AI) of 0 /hour. There were 3  hypopneas with a hypopnea index of 0.6 /hour.  The total APNEA/HYPOPNEA INDEX (AHI) was 0.6/hour.   2 events occurred in REM sleep and 2 events in NREM. The REM AHI was 1.6 /hour, versus a non-REM AHI of 0.3/h. The patient spent 10 minutes of total sleep time in the supine position and 297 minutes in non-supine. The supine AHI was 0.0 versus a non-supine AHI of 0.6.  OXYGEN SATURATION & C02:  The Wake baseline 02 saturation was 92%, with the lowest being 87%. Time spent below 89% saturation equaled 1 minute. The arousals were noted as: 45 were spontaneous, 0 were associated with PLMs, 0 were associated with respiratory events. The patient had a total of 0 Periodic Limb Movements.   Audio and video analysis did not show any abnormal or unusual movements, behaviors, phonations or vocalizations.     EKG was in keeping with normal sinus rhythm (NSR).   IMPRESSION:  1. No evidence of hypoxia, of Obstructive Sleep Apnea (OSA). 2. All arousals were spontaneous and unrelated to physiological factors. There was one prolonged period of wakefulness beginning at 1 AM until 2.30 AM. Cause at onset was neck pain and there was coughing during this period.    RECOMMENDATIONS: No physiological sleep disorder identified.  Consider cognitive behaviour therapy for insomnia.    I certify that I have reviewed the entire raw  data recording prior to the issuance of this report in accordance with the Standards of Accreditation of the American Academy of Sleep Medicine (AASM)     Melvyn Novas, MD Diplomat, American Board of Psychiatry and Neurology  Diplomat, American Board of Sleep Medicine Wellsite geologist, [place] [facility]

## 2021-02-06 DIAGNOSIS — M79604 Pain in right leg: Secondary | ICD-10-CM | POA: Diagnosis not present

## 2021-02-06 DIAGNOSIS — M25551 Pain in right hip: Secondary | ICD-10-CM | POA: Diagnosis not present

## 2021-02-06 DIAGNOSIS — M2560 Stiffness of unspecified joint, not elsewhere classified: Secondary | ICD-10-CM | POA: Diagnosis not present

## 2021-02-06 DIAGNOSIS — M6281 Muscle weakness (generalized): Secondary | ICD-10-CM | POA: Diagnosis not present

## 2021-02-10 DIAGNOSIS — M25551 Pain in right hip: Secondary | ICD-10-CM | POA: Diagnosis not present

## 2021-02-10 DIAGNOSIS — M545 Low back pain, unspecified: Secondary | ICD-10-CM | POA: Diagnosis not present

## 2021-02-13 DIAGNOSIS — M79604 Pain in right leg: Secondary | ICD-10-CM | POA: Diagnosis not present

## 2021-02-13 DIAGNOSIS — M25551 Pain in right hip: Secondary | ICD-10-CM | POA: Diagnosis not present

## 2021-02-13 DIAGNOSIS — M6281 Muscle weakness (generalized): Secondary | ICD-10-CM | POA: Diagnosis not present

## 2021-02-13 DIAGNOSIS — M2560 Stiffness of unspecified joint, not elsewhere classified: Secondary | ICD-10-CM | POA: Diagnosis not present

## 2021-02-16 ENCOUNTER — Other Ambulatory Visit: Payer: Self-pay | Admitting: Orthopedic Surgery

## 2021-02-16 DIAGNOSIS — M6281 Muscle weakness (generalized): Secondary | ICD-10-CM | POA: Diagnosis not present

## 2021-02-16 DIAGNOSIS — M25551 Pain in right hip: Secondary | ICD-10-CM | POA: Diagnosis not present

## 2021-02-16 DIAGNOSIS — M47816 Spondylosis without myelopathy or radiculopathy, lumbar region: Secondary | ICD-10-CM | POA: Diagnosis not present

## 2021-02-16 DIAGNOSIS — E7800 Pure hypercholesterolemia, unspecified: Secondary | ICD-10-CM | POA: Diagnosis not present

## 2021-02-16 DIAGNOSIS — M545 Low back pain, unspecified: Secondary | ICD-10-CM

## 2021-02-16 DIAGNOSIS — E1169 Type 2 diabetes mellitus with other specified complication: Secondary | ICD-10-CM | POA: Diagnosis not present

## 2021-02-16 DIAGNOSIS — M189 Osteoarthritis of first carpometacarpal joint, unspecified: Secondary | ICD-10-CM | POA: Diagnosis not present

## 2021-02-16 DIAGNOSIS — I10 Essential (primary) hypertension: Secondary | ICD-10-CM | POA: Diagnosis not present

## 2021-02-16 DIAGNOSIS — K219 Gastro-esophageal reflux disease without esophagitis: Secondary | ICD-10-CM | POA: Diagnosis not present

## 2021-02-16 DIAGNOSIS — M19041 Primary osteoarthritis, right hand: Secondary | ICD-10-CM | POA: Diagnosis not present

## 2021-02-16 DIAGNOSIS — J4520 Mild intermittent asthma, uncomplicated: Secondary | ICD-10-CM | POA: Diagnosis not present

## 2021-02-16 DIAGNOSIS — M47812 Spondylosis without myelopathy or radiculopathy, cervical region: Secondary | ICD-10-CM | POA: Diagnosis not present

## 2021-02-16 DIAGNOSIS — M2560 Stiffness of unspecified joint, not elsewhere classified: Secondary | ICD-10-CM | POA: Diagnosis not present

## 2021-02-16 DIAGNOSIS — F4321 Adjustment disorder with depressed mood: Secondary | ICD-10-CM | POA: Diagnosis not present

## 2021-02-16 DIAGNOSIS — M79604 Pain in right leg: Secondary | ICD-10-CM | POA: Diagnosis not present

## 2021-02-20 DIAGNOSIS — M5430 Sciatica, unspecified side: Secondary | ICD-10-CM | POA: Diagnosis not present

## 2021-02-20 DIAGNOSIS — M6281 Muscle weakness (generalized): Secondary | ICD-10-CM | POA: Diagnosis not present

## 2021-02-20 DIAGNOSIS — M2560 Stiffness of unspecified joint, not elsewhere classified: Secondary | ICD-10-CM | POA: Diagnosis not present

## 2021-02-20 DIAGNOSIS — M79604 Pain in right leg: Secondary | ICD-10-CM | POA: Diagnosis not present

## 2021-02-23 DIAGNOSIS — M79604 Pain in right leg: Secondary | ICD-10-CM | POA: Diagnosis not present

## 2021-02-23 DIAGNOSIS — M2560 Stiffness of unspecified joint, not elsewhere classified: Secondary | ICD-10-CM | POA: Diagnosis not present

## 2021-02-23 DIAGNOSIS — M25551 Pain in right hip: Secondary | ICD-10-CM | POA: Diagnosis not present

## 2021-02-23 DIAGNOSIS — M6281 Muscle weakness (generalized): Secondary | ICD-10-CM | POA: Diagnosis not present

## 2021-02-24 DIAGNOSIS — M189 Osteoarthritis of first carpometacarpal joint, unspecified: Secondary | ICD-10-CM | POA: Diagnosis not present

## 2021-02-24 DIAGNOSIS — M19041 Primary osteoarthritis, right hand: Secondary | ICD-10-CM | POA: Diagnosis not present

## 2021-02-24 DIAGNOSIS — J4520 Mild intermittent asthma, uncomplicated: Secondary | ICD-10-CM | POA: Diagnosis not present

## 2021-02-24 DIAGNOSIS — E1169 Type 2 diabetes mellitus with other specified complication: Secondary | ICD-10-CM | POA: Diagnosis not present

## 2021-02-24 DIAGNOSIS — I10 Essential (primary) hypertension: Secondary | ICD-10-CM | POA: Diagnosis not present

## 2021-02-24 DIAGNOSIS — E7800 Pure hypercholesterolemia, unspecified: Secondary | ICD-10-CM | POA: Diagnosis not present

## 2021-02-24 DIAGNOSIS — F4321 Adjustment disorder with depressed mood: Secondary | ICD-10-CM | POA: Diagnosis not present

## 2021-02-24 DIAGNOSIS — K219 Gastro-esophageal reflux disease without esophagitis: Secondary | ICD-10-CM | POA: Diagnosis not present

## 2021-02-27 DIAGNOSIS — M2560 Stiffness of unspecified joint, not elsewhere classified: Secondary | ICD-10-CM | POA: Diagnosis not present

## 2021-02-27 DIAGNOSIS — M6281 Muscle weakness (generalized): Secondary | ICD-10-CM | POA: Diagnosis not present

## 2021-02-27 DIAGNOSIS — M25551 Pain in right hip: Secondary | ICD-10-CM | POA: Diagnosis not present

## 2021-02-27 DIAGNOSIS — M79604 Pain in right leg: Secondary | ICD-10-CM | POA: Diagnosis not present

## 2021-03-02 DIAGNOSIS — M6281 Muscle weakness (generalized): Secondary | ICD-10-CM | POA: Diagnosis not present

## 2021-03-02 DIAGNOSIS — M79604 Pain in right leg: Secondary | ICD-10-CM | POA: Diagnosis not present

## 2021-03-02 DIAGNOSIS — M2560 Stiffness of unspecified joint, not elsewhere classified: Secondary | ICD-10-CM | POA: Diagnosis not present

## 2021-03-02 DIAGNOSIS — M25551 Pain in right hip: Secondary | ICD-10-CM | POA: Diagnosis not present

## 2021-03-05 ENCOUNTER — Other Ambulatory Visit: Payer: Self-pay

## 2021-03-05 ENCOUNTER — Ambulatory Visit
Admission: RE | Admit: 2021-03-05 | Discharge: 2021-03-05 | Disposition: A | Discharge status: Home / Self Care | Payer: HMO | Source: Ambulatory Visit | Attending: Orthopedic Surgery | Admitting: Orthopedic Surgery

## 2021-03-05 DIAGNOSIS — M48061 Spinal stenosis, lumbar region without neurogenic claudication: Secondary | ICD-10-CM | POA: Diagnosis not present

## 2021-03-05 DIAGNOSIS — M545 Low back pain, unspecified: Secondary | ICD-10-CM

## 2021-03-05 IMAGING — MR MR LUMBAR SPINE W/O CM
4 of 5 series · 26 of 48 positions shown · non-contrast
Comparison: None.

CLINICAL DATA: 69-year-old female with 2 months of low back pain
radiating to the right hip. Three prior falls.

EXAM:
MRI LUMBAR SPINE WITHOUT CONTRAST
TECHNIQUE: Multiplanar, multisequence MR imaging of the lumbar spine was
performed. No intravenous contrast was administered.

[Series 3: T2 · sagittal · 4.0mm · 1.09mm/px · 6 of 17 slices shown (1 of 2)]
[im 1/17]
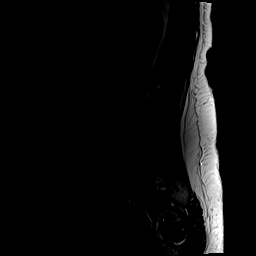
[im 4/17]
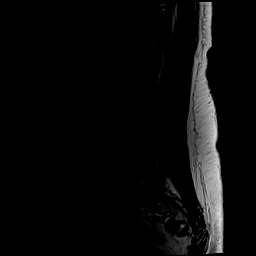
[im 7/17]
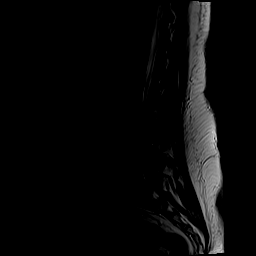
[im 10/17]
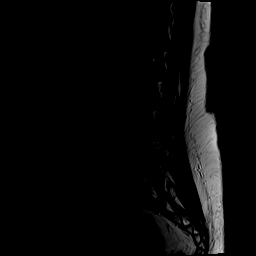
[im 13/17]
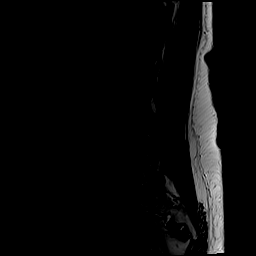
[im 17/17]
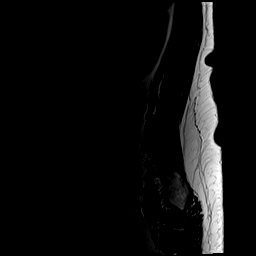

[Series 5: T1 · sagittal · 4.0mm · 1.09mm/px · 6 of 17 slices shown (1 of 2)]
[im 1/17]
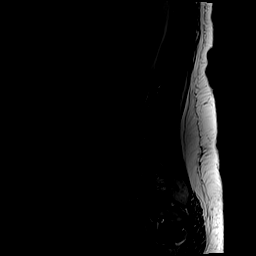
[im 4/17]
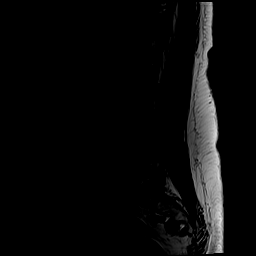
[im 7/17]
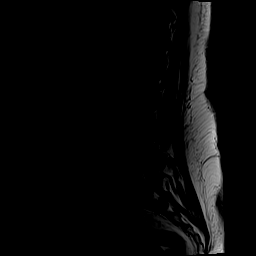
[im 10/17]
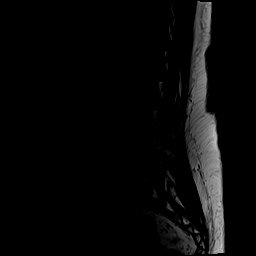
[im 13/17]
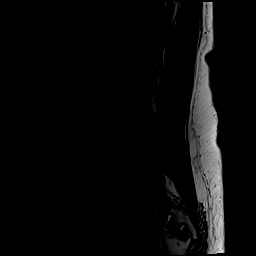
[im 17/17]
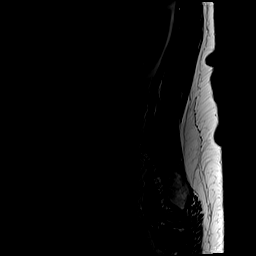

[Series 6: T2 · axial · 4.0mm · 0.39mm/px · z∈[-33,+178]mm · 9 of 42 slices shown (2 of 2)]
[im 1/42]
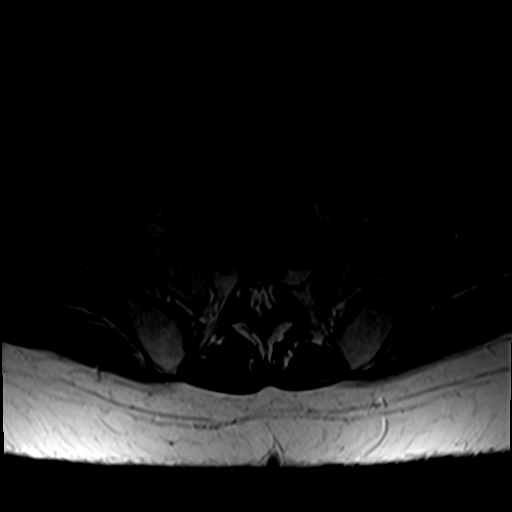
[im 6/42]
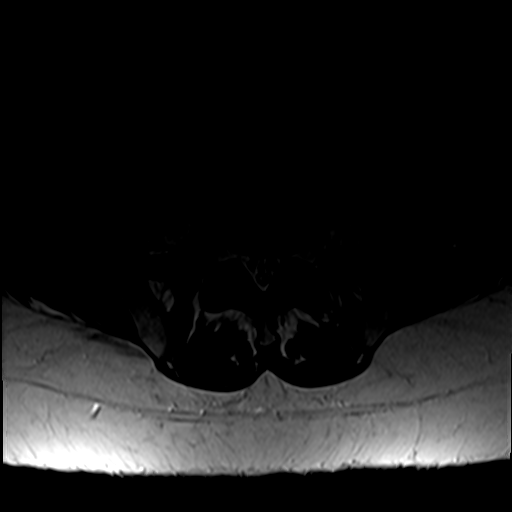
[im 12/42]
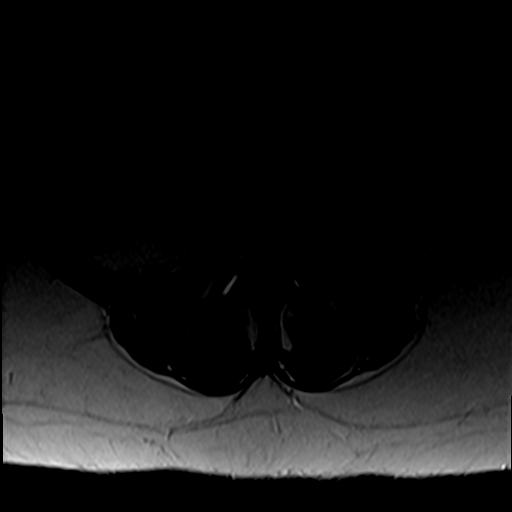
[im 18/42]
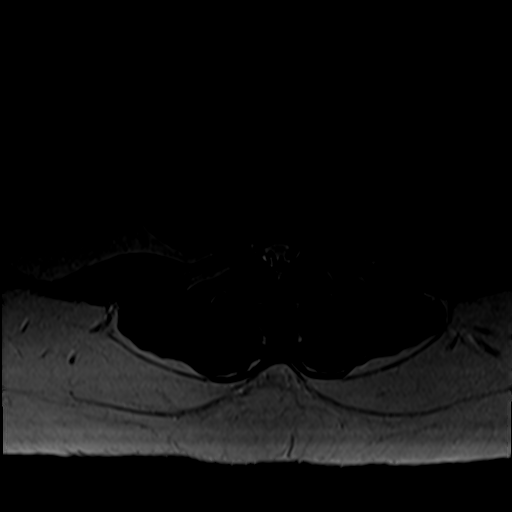
[im 21/42]
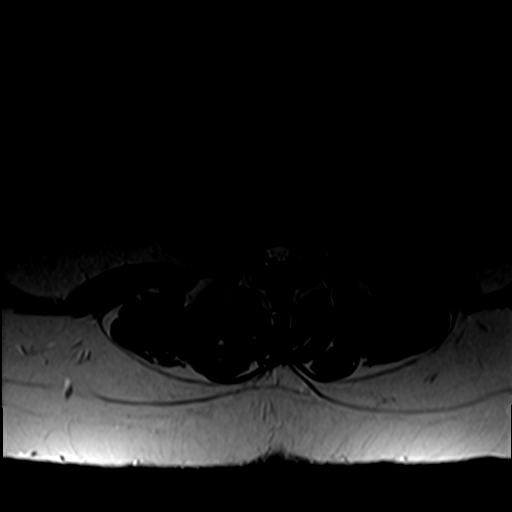
[im 24/42]
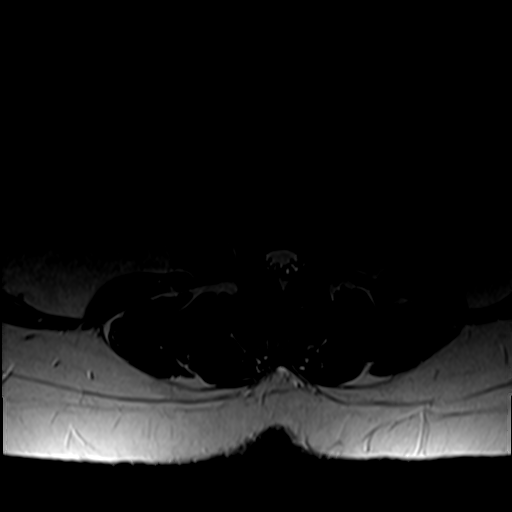
[im 30/42]
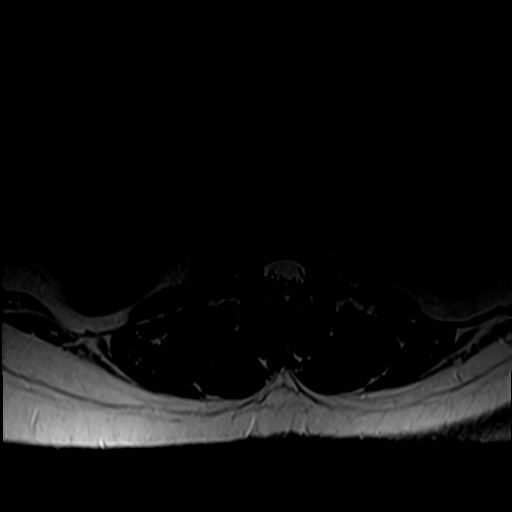
[im 36/42]
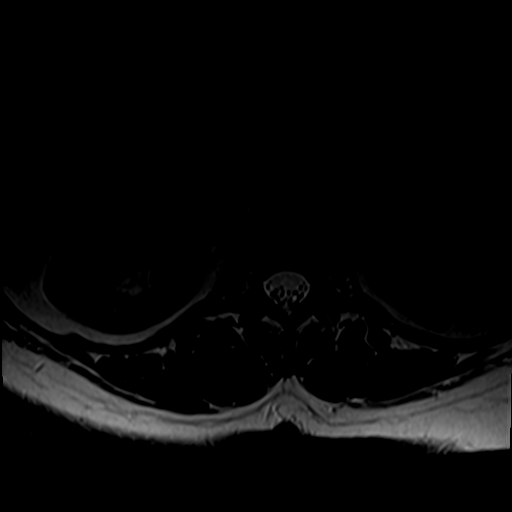
[im 42/42]
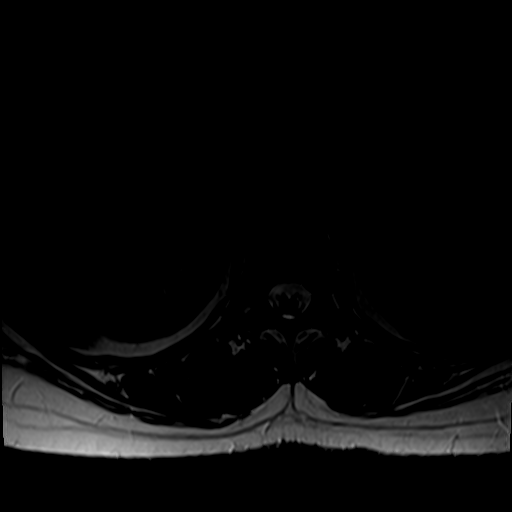

[Series 7: T1 · axial · 4.0mm · 0.39mm/px · z∈[-33,+149]mm · 5 of 42 slices shown (2 of 2)]
[im 1/42]
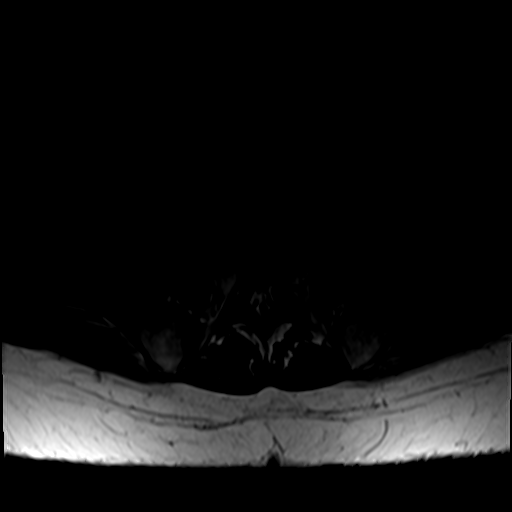
[im 6/42]
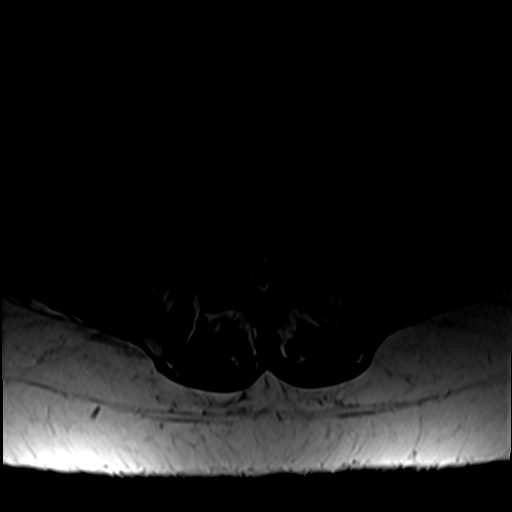
[im 12/42]
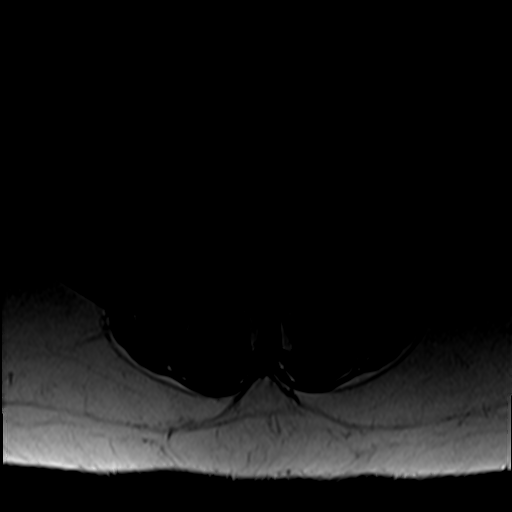
[im 21/42]
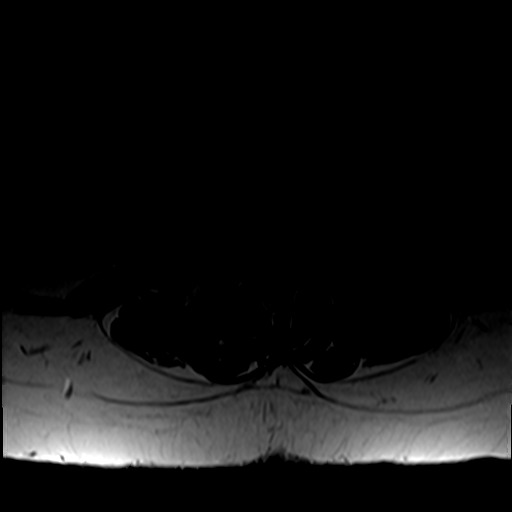
[im 36/42]
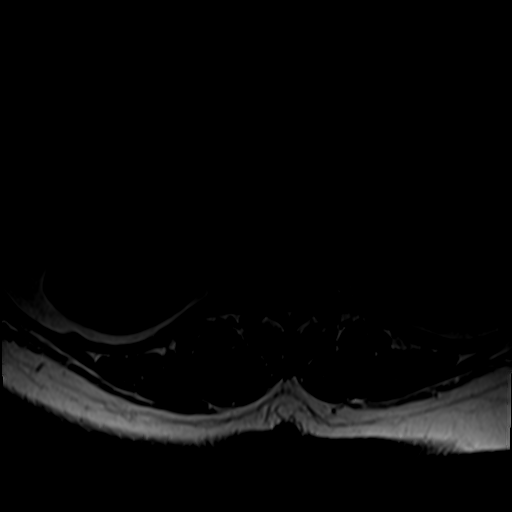

[26 of 48 positions shown; findings below may reference images not displayed]

FINDINGS: Segmentation: Lumbar segmentation appears to be normal and will be
designated as such for this report.

Alignment: Preserved lumbar lordosis. Subtle anterolisthesis of L4
on L5, see details below.

Vertebrae: No marrow edema or evidence of acute osseous abnormality.
Visualized bone marrow signal is within normal limits. Intact
visible sacrum and SI joints.

Conus medullaris and cauda equina: Conus extends to the T12-L1
level. No lower spinal cord or conus signal abnormality.

Paraspinal and other soft tissues: Visualized abdominal viscera and
paraspinal soft tissues are within normal limits. Small 12 mm
uterine fundal fibroid incidentally noted (series 4, image 10).

Disc levels:

T11-T12: Negative.

T12-L1:  Negative.

L1-L2:  Negative.

L2-L3:  Negative.

L3-L4: Mild far lateral disc bulging. Mild posterior element
hypertrophy. No stenosis.

L4-L5: Subtle anterolisthesis. Mild circumferential disc bulge.
Moderate to severe facet degeneration. Capacious facet joints
containing fluid. Similar ligament flavum hypertrophy.

Subtle synovial cyst measuring 4-5 mm projecting anteriorly from the
right facet, best seen on series 4, image 9. This extends toward the
right lateral recess.

Overall moderate spinal stenosis. Up to severe right lateral recess
stenosis (right L5 nerve level). Only mild L4 foraminal stenosis
greater on the left.

L5-S1: Epidural lipomatosis mostly effaces CSF from the thecal sac
below the above disc space. Minimal circumferential disc bulge and
endplate spurring. Mild to moderate facet and ligament flavum
hypertrophy. No other stenosis.
IMPRESSION: 1. Subtle anterolisthesis at L4-L5 with moderate to severe facet
arthropathy including a small 4-5 mm anteriorly directed synovial
cyst from the right facet toward the right lateral recess. Query
Right L5 radiculitis. Moderate multifactorial spinal stenosis at
that level.

2. Mild for age lumbar spine degeneration elsewhere. Epidural
lipomatosis at L5-S1.

## 2021-03-10 DIAGNOSIS — M25551 Pain in right hip: Secondary | ICD-10-CM | POA: Diagnosis not present

## 2021-03-14 ENCOUNTER — Ambulatory Visit
Admission: RE | Admit: 2021-03-14 | Discharge: 2021-03-14 | Disposition: A | Discharge status: Home / Self Care | Payer: HMO | Source: Ambulatory Visit | Attending: Orthopedic Surgery | Admitting: Orthopedic Surgery

## 2021-03-14 DIAGNOSIS — M25551 Pain in right hip: Secondary | ICD-10-CM

## 2021-03-14 DIAGNOSIS — S73191A Other sprain of right hip, initial encounter: Secondary | ICD-10-CM | POA: Diagnosis not present

## 2021-03-14 IMAGING — MR MR HIP*R* W/CM
4 of 6 series · 17 of 40 positions shown · IV contrast (agent unspecified)
Comparison: None.

CLINICAL DATA: Right hip and groin pain since fall 3 months ago. No
prior surgery.

EXAM:
MRI OF THE RIGHT HIP WITH CONTRAST (MR ARTHROGRAM)
TECHNIQUE: Multiplanar, multisequence MR imaging was performed following the
administration of intra-articular contrast.
CONTRAST:  See injection documentation.

[Series 3: T1 · coronal · 4.0mm · 0.53mm/px · 6 of 24 slices shown]
[im 1/24]
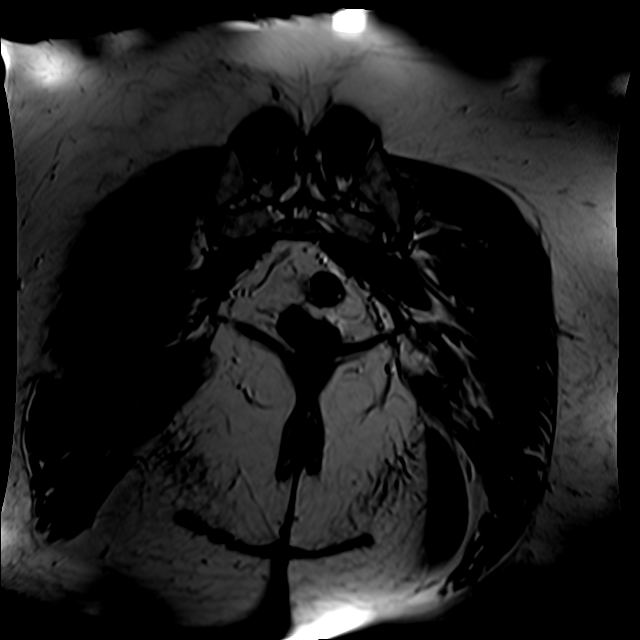
[im 5/24]
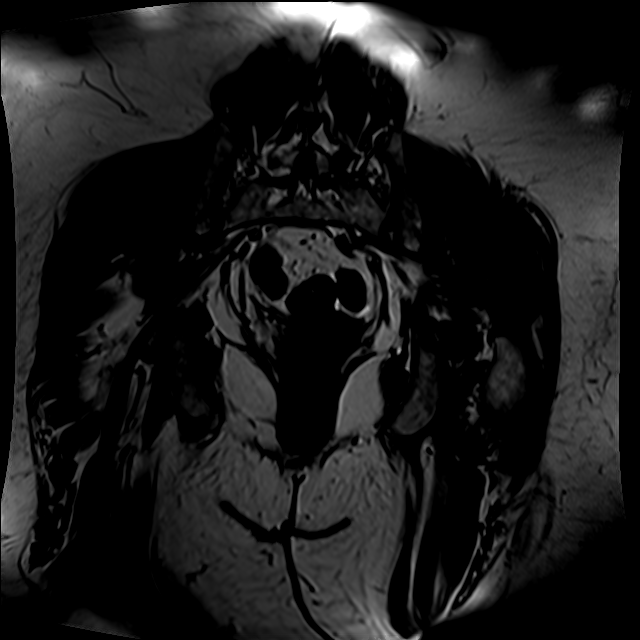
[im 10/24]
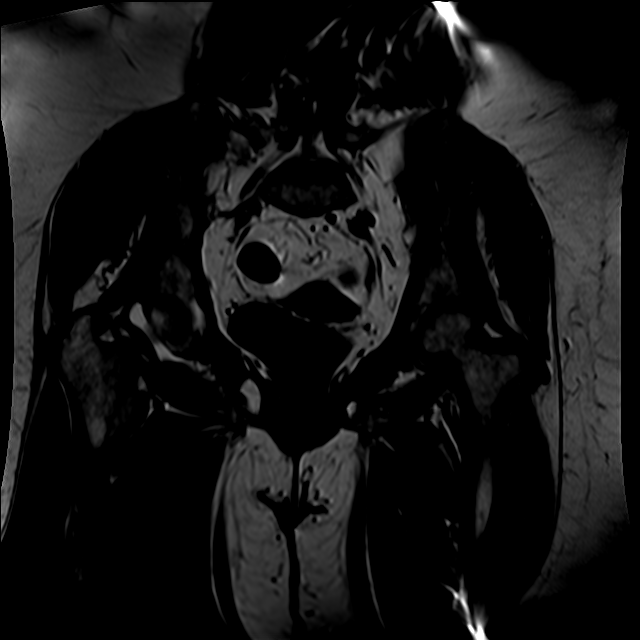
[im 14/24]
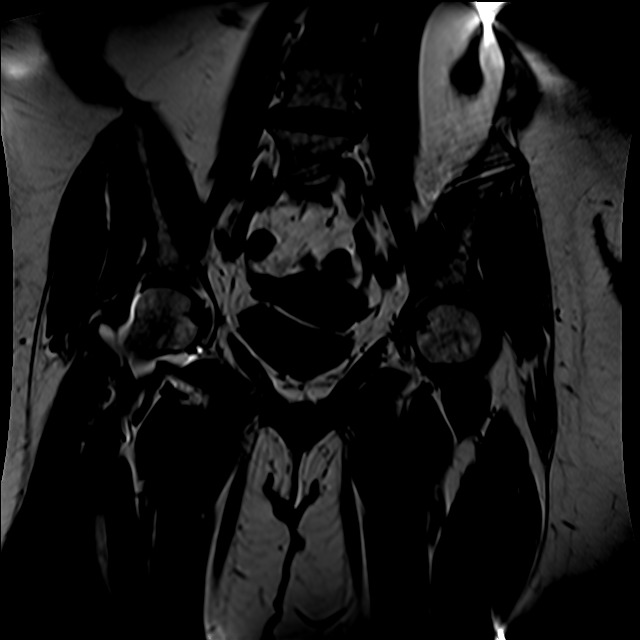
[im 19/24]
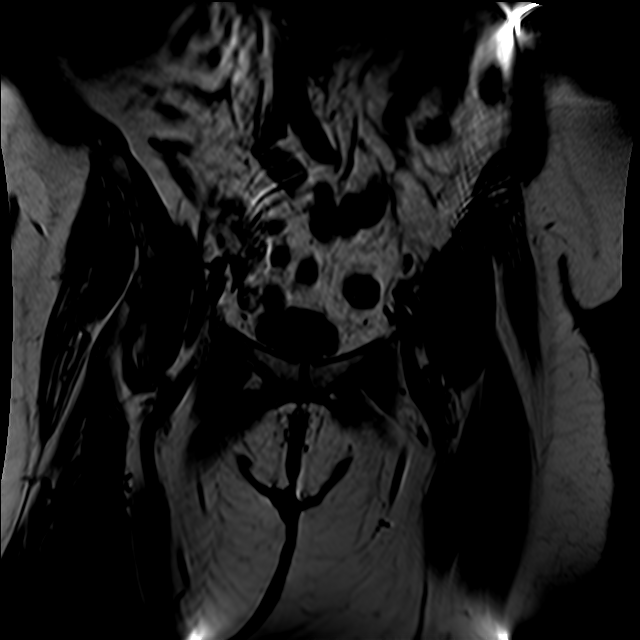
[im 24/24]
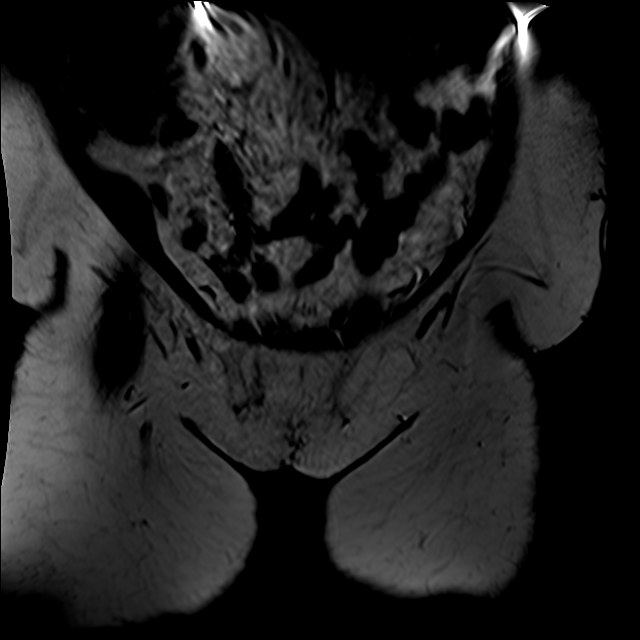

[Series 4: T2 fat-sat · coronal · 4.0mm · 0.53mm/px · 5 of 24 slices shown (1 of 2)]
[im 1/24]
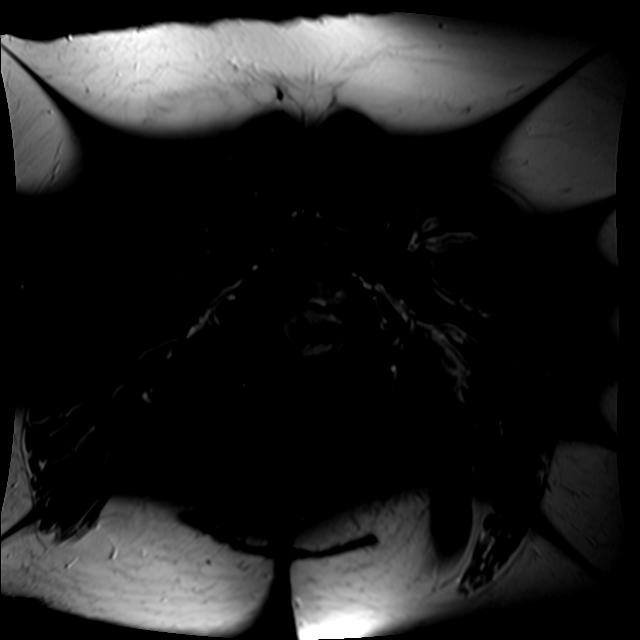
[im 4/24]
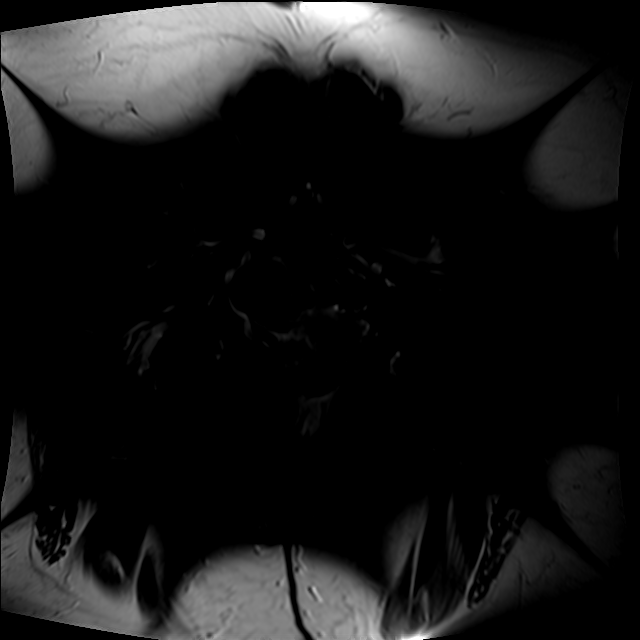
[im 8/24]
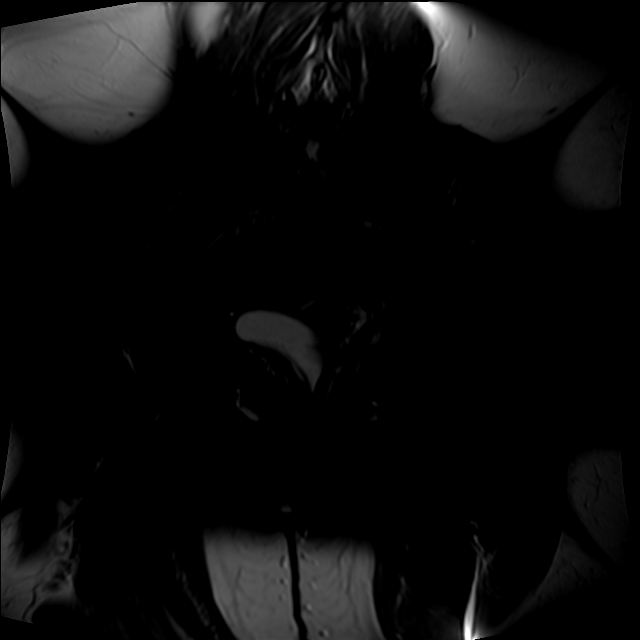
[im 12/24]
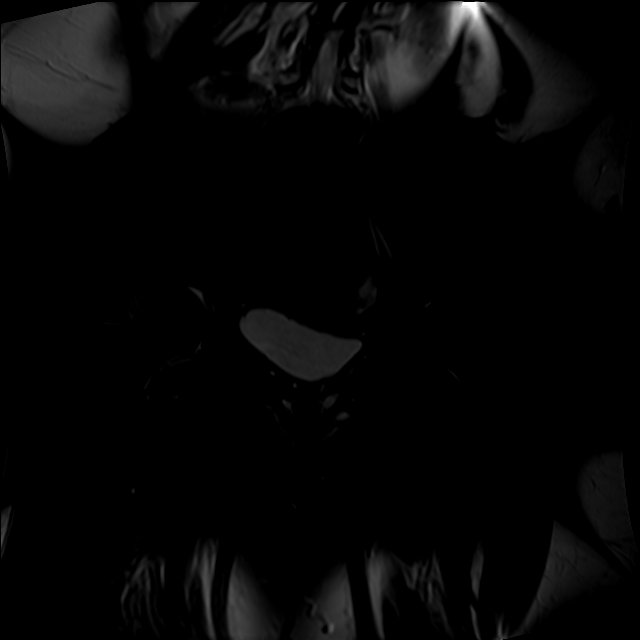
[im 20/24]
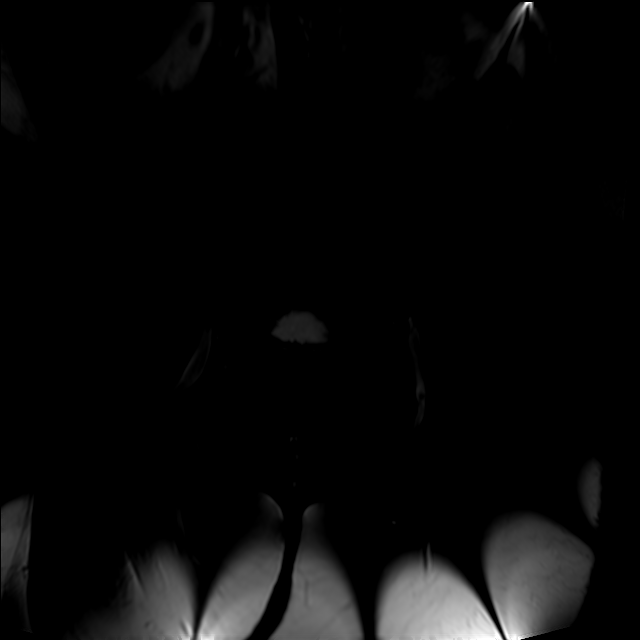

[Series 5: T2 fat-sat · axial · 4.0mm · 0.70mm/px · z∈[+13,+113]mm · 3 of 29 slices shown (2 of 2)]
[im 5/29]
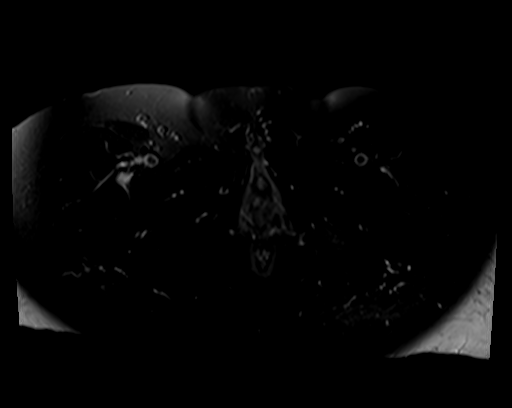
[im 17/29]
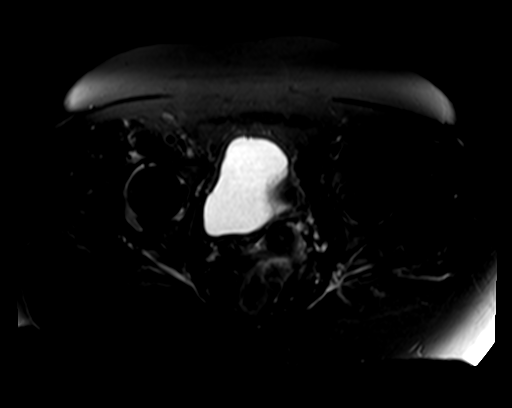
[im 25/29]
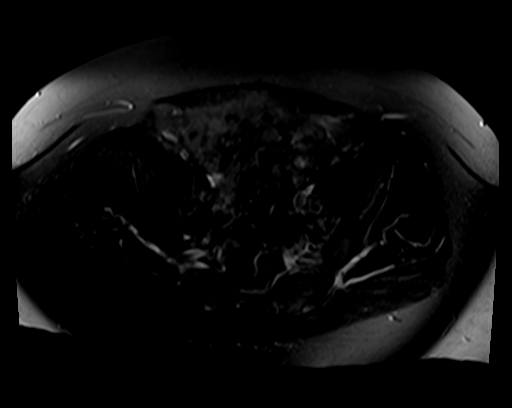

[Series 6: T1 fat-sat · axial · 4.0mm · 0.70mm/px · z∈[+3,+71]mm · 3 of 22 slices shown]
[im 5/22]
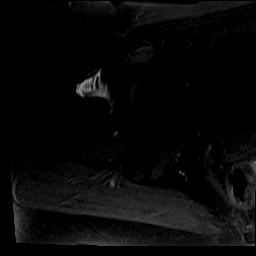
[im 13/22]
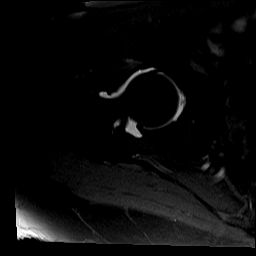
[im 22/22]
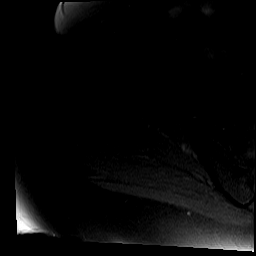

[17 of 40 positions shown; findings below may reference images not displayed]

FINDINGS: Bones: There is no evidence of acute fracture, dislocation or
avascular necrosis. No focal bone lesion. The visualized sacroiliac
joints and symphysis pubis appear normal.

Articular cartilage and labrum

Articular cartilage: No focal chondral defect or subchondral signal
abnormality identified.

Labrum: Small partial tear of the right anterior superior labrum
(series 6, image 9; series 7, image 12). The left labrum is grossly
intact.

Joint or bursal effusion

Joint effusion: The right hip joint is distended with
intra-articular contrast. No left hip joint effusion.

Bursae: No focal periarticular fluid collection.

Muscles and tendons

Muscles and tendons: The visualized gluteus, hamstring and iliopsoas
tendons appear normal. No muscle edema or atrophy.

Other findings

Miscellaneous: Small intramural uterine fibroids. The visualized
internal pelvic contents are otherwise unremarkable.
IMPRESSION: 1. Small partial tear of the right anterior superior labrum.

## 2021-03-14 IMAGING — XA DG FLUORO GUIDE NDL PLC/BX
2 series · 2 of 2 positions shown · non-contrast
Comparison: none

CLINICAL DATA: Persistent right hip pain since fall last [REDACTED].

[Series 1: ortho adipose · 1 of 1 slices shown (1 of 2)]
[im 1/1]
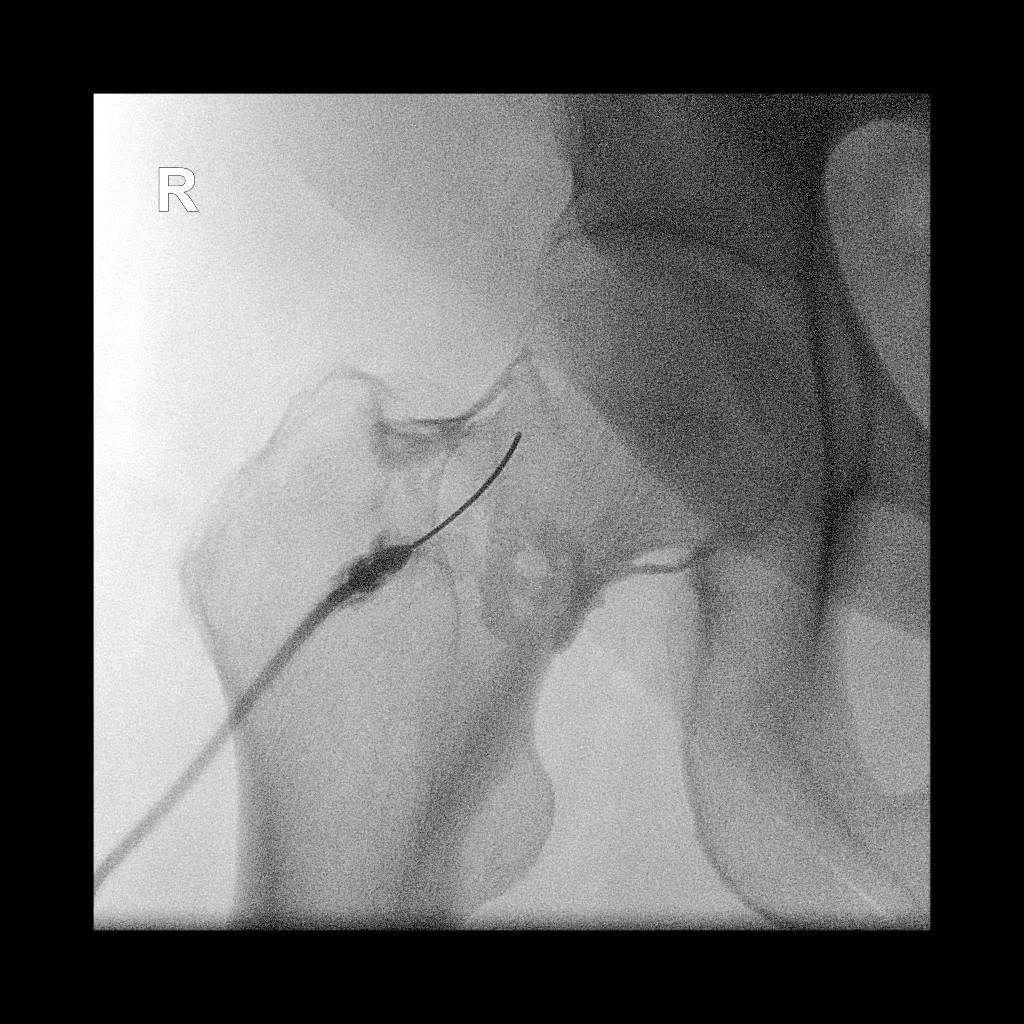

[Series 2: ortho adipose · 1 of 1 slices shown (2 of 2)]
[im 1/1]
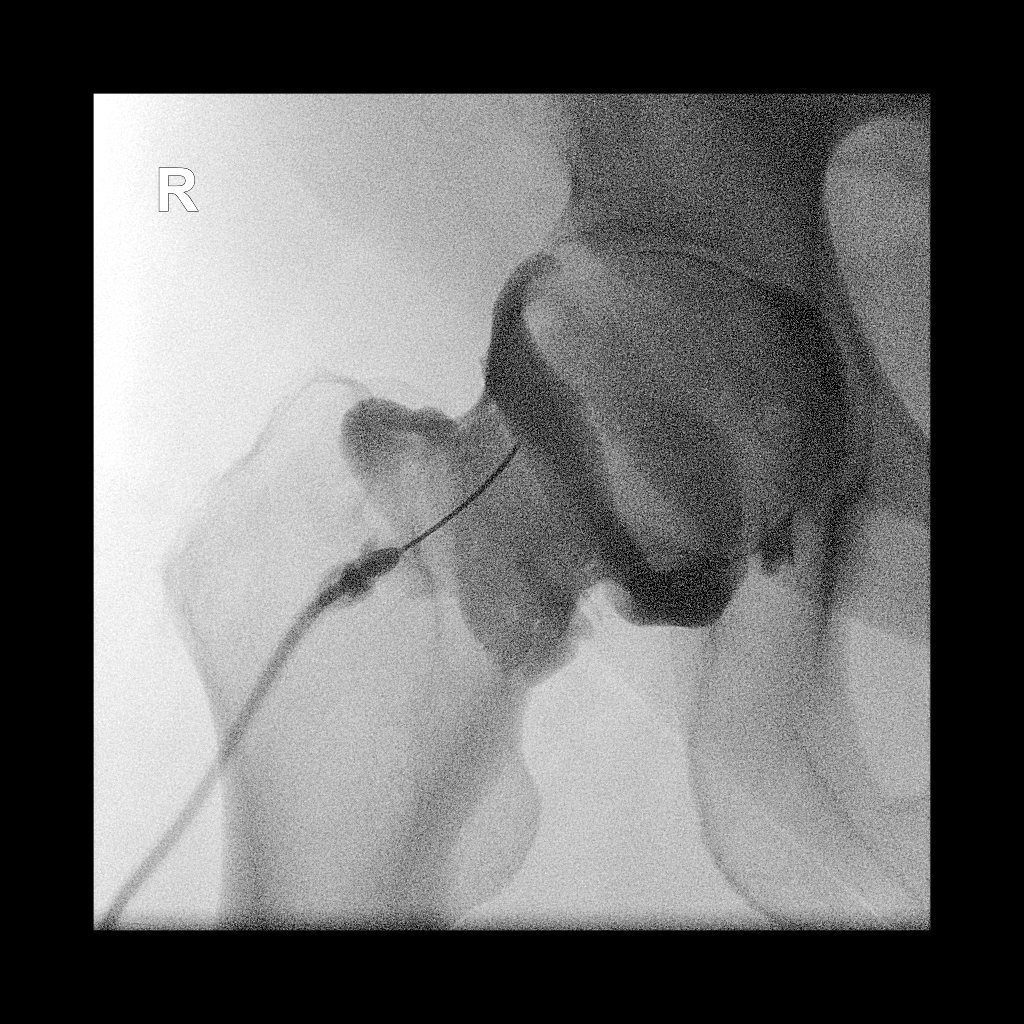

[2 of 2 positions shown; findings below may reference images not displayed]

FLUOROSCOPY TIME:  Radiation Exposure Index (as provided by the
fluoroscopic device): 0.6 mGy

Fluoroscopy Time:  8 seconds

Number of Acquired Images:  0

PROCEDURE:
The risks and benefits of the procedure were discussed with the
patient, and written informed consent was obtained. The patient
stated no history of allergy to contrast media. A formal timeout
procedure was performed with the patient according to departmental
protocol.

The patient was placed supine on the fluoroscopy table and the right
hip joint was identified under fluoroscopy. The skin overlying the
right hip joint was subsequently cleaned with Betadine and a sterile
drape was placed over the area of interest. 2 ml 1% Lidocaine was
used to anesthetize the skin around the needle insertion site.

A 22 gauge spinal needle was inserted into the right hip joint under
fluoroscopy.

12 ml of gadolinium mixture (0.1 ml of Multihance mixed with 15 ml
of Isovue-M 200 contrast and 5 ml of sterile saline) were injected
into the right hip joint.

The needle was removed and hemostasis was achieved. The patient was
subsequently transferred to MRI for imaging.
IMPRESSION: Technically successful right hip injection for MRI.

## 2021-03-14 MED ORDER — IOPAMIDOL (ISOVUE-M 200) INJECTION 41%
12.0000 mL | Freq: Once | INTRAMUSCULAR | Status: AC
  Administered 2021-03-14: 12 mL via INTRA_ARTICULAR

## 2021-03-15 DIAGNOSIS — M4807 Spinal stenosis, lumbosacral region: Secondary | ICD-10-CM | POA: Diagnosis not present

## 2021-03-15 DIAGNOSIS — Z6829 Body mass index (BMI) 29.0-29.9, adult: Secondary | ICD-10-CM | POA: Diagnosis not present

## 2021-03-15 DIAGNOSIS — Z8673 Personal history of transient ischemic attack (TIA), and cerebral infarction without residual deficits: Secondary | ICD-10-CM | POA: Diagnosis not present

## 2021-03-15 DIAGNOSIS — E785 Hyperlipidemia, unspecified: Secondary | ICD-10-CM | POA: Diagnosis not present

## 2021-03-15 DIAGNOSIS — E1169 Type 2 diabetes mellitus with other specified complication: Secondary | ICD-10-CM | POA: Diagnosis not present

## 2021-03-15 DIAGNOSIS — I10 Essential (primary) hypertension: Secondary | ICD-10-CM | POA: Diagnosis not present

## 2021-03-28 DIAGNOSIS — M48062 Spinal stenosis, lumbar region with neurogenic claudication: Secondary | ICD-10-CM | POA: Diagnosis not present

## 2021-03-28 DIAGNOSIS — M7138 Other bursal cyst, other site: Secondary | ICD-10-CM | POA: Diagnosis not present

## 2021-03-28 DIAGNOSIS — M47816 Spondylosis without myelopathy or radiculopathy, lumbar region: Secondary | ICD-10-CM | POA: Diagnosis not present

## 2021-03-30 ENCOUNTER — Other Ambulatory Visit: Payer: Self-pay | Admitting: Neurosurgery

## 2021-04-17 NOTE — Pre-Procedure Instructions (Signed)
Surgical Instructions:    Your procedure is scheduled on Thursday, 04/20/21 (11:40 AM- 3:26 PM).  Report to [facility] Main Entrance "A" at 09:40 A.M., then check in with the Admitting office.  Call this number if you have any questions prior to, or have any problems the morning of surgery:  [phone]    Remember:  Do not eat or drink after midnight the night before your surgery.     Take these medicines the morning of surgery with A SIP OF WATER: carvedilol (COREG)   IF NEEDED: acetaminophen (TYLENOL)  Fexofenadine HCl (ALLEGRA PO) LORazepam (ATIVAN)  ondansetron (ZOFRAN)  tiZANidine (ZANAFLEX)  traMADol (ULTRAM) fluticasone (FLONASE) nasal spray albuterol (VENTOLIN HFA) inhaler- bring with you the day of surgery    As of today, STOP taking any Aspirin (unless otherwise instructed by your surgeon) Aleve, Naproxen, Ibuprofen, Motrin, Advil, Goody's, BC's, all herbal medications, fish oil, and all vitamins.            WHAT DO I DO ABOUT MY DIABETES MEDICATION?  Marland Kitchen The day of surgery, do not take other diabetes injectables, including Trulicity (dulaglutide).    HOW TO MANAGE YOUR DIABETES BEFORE AND AFTER SURGERY  Why is it important to control my blood sugar before and after surgery? . Improving blood sugar levels before and after surgery helps healing and can limit problems. . A way of improving blood sugar control is eating a healthy diet by: o  Eating less sugar and carbohydrates o  Increasing activity/exercise o  Talking with your doctor about reaching your blood sugar goals . High blood sugars (greater than 180 mg/dL) can raise your risk of infections and slow your recovery, so you will need to focus on controlling your diabetes during the weeks before surgery. . Make sure that the doctor who takes care of your diabetes knows about your planned surgery including the date and location.  How do I manage my blood sugar before surgery? . Check your blood sugar at  least 4 times a day, starting 2 days before surgery, to make sure that the level is not too high or low. . Check your blood sugar the morning of your surgery when you wake up and every 2 hours until you get to the Short Stay unit. o If your blood sugar is less than 70 mg/dL, you will need to treat for low blood sugar: - Do not take insulin. - Treat a low blood sugar (less than 70 mg/dL) with  cup of clear juice (cranberry or apple), 4 glucose tablets, OR glucose gel. - Recheck blood sugar in 15 minutes after treatment (to make sure it is greater than 70 mg/dL). If your blood sugar is not greater than 70 mg/dL on recheck, call [phone] for further instructions. . Report your blood sugar to the short stay nurse when you get to Short Stay.  . If you are admitted to the hospital after surgery: o Your blood sugar will be checked by the staff and you will probably be given insulin after surgery (instead of oral diabetes medicines) to make sure you have good blood sugar levels. o The goal for blood sugar control after surgery is 80-180 mg/dL.    Special instructions:   [facility]- Preparing For Surgery  Before surgery, you can play an important role. Because skin is not sterile, your skin needs to be as free of germs as possible. You can reduce the number of germs on your skin by washing with CHG (chlorahexidine gluconate) Soap  before surgery.  CHG is an antiseptic cleaner which kills germs and bonds with the skin to continue killing germs even after washing.    Oral Hygiene is also important to reduce your risk of infection.  Remember - BRUSH YOUR TEETH THE MORNING OF SURGERY WITH YOUR REGULAR TOOTHPASTE  Please do not use if you have an allergy to CHG or antibacterial soaps. If your skin becomes reddened/irritated stop using the CHG.  Do not shave (including legs and underarms) for at least 48 hours prior to first CHG shower. It is OK to shave your face.  Please follow these instructions  carefully.   1. Shower the NIGHT BEFORE SURGERY and the MORNING OF SURGERY  2. If you chose to wash your hair, wash your hair first as usual with your normal shampoo.  3. After you shampoo, rinse your hair and body thoroughly to remove the shampoo.  4. Wash Face and genitals (private parts) with your normal soap.   5. Use CHG Soap as you would any other liquid soap. You can apply CHG directly to the skin and wash gently with a scrungie or a clean washcloth.   6. Apply the CHG Soap to your body ONLY FROM THE NECK DOWN.  Do not use on open wounds or open sores. Avoid contact with your eyes, ears, mouth and genitals (private parts). Wash Face and genitals (private parts)  with your normal soap.   7. Wash thoroughly, paying special attention to the area where your surgery will be performed.  8. Thoroughly rinse your body with warm water from the neck down.  9. DO NOT shower/wash with your normal soap after using and rinsing off the CHG Soap.  10. Pat yourself dry with a CLEAN TOWEL.  11. Wear CLEAN PAJAMAS to bed the night before surgery.  12. Place CLEAN SHEETS on your bed the night before your surgery.  13. DO NOT SLEEP WITH PETS.   Day of Surgery: SHOWER with CHG soap. Brush your teeth WITH YOUR REGULAR TOOTHPASTE. Wear Clean/Comfortable clothing the morning of surgery. Do not apply any deodorants/lotions.   Do not wear jewelry, make up, or nail polish. Do not shave 48 hours prior to surgery.    Do NOT Smoke (Tobacco/Vaping) or drink Alcohol 24 hours prior to your procedure. Do not bring valuables to the hospital. [facility] is not responsible for any belongings or valuables.  If you use a CPAP at night, you may bring all equipment for your overnight stay.   Contacts, glasses, or dentures may not be worn into surgery, please bring cases for these belongings.   For patients admitted to the hospital, discharge time will be determined by your treatment team.   Patients  discharged the day of surgery will not be allowed to drive home, and someone needs to stay with them for 24 hours.    Please read over the following fact sheets that you were given.

## 2021-04-18 ENCOUNTER — Other Ambulatory Visit: Payer: Self-pay

## 2021-04-18 ENCOUNTER — Encounter (HOSPITAL_COMMUNITY)
Admission: RE | Admit: 2021-04-18 | Discharge: 2021-04-18 | Disposition: A | Discharge status: Home / Self Care | Payer: HMO | Source: Ambulatory Visit | Attending: Neurosurgery | Admitting: Neurosurgery

## 2021-04-18 ENCOUNTER — Encounter (HOSPITAL_COMMUNITY): Payer: Self-pay

## 2021-04-18 DIAGNOSIS — Z880 Allergy status to penicillin: Secondary | ICD-10-CM | POA: Diagnosis not present

## 2021-04-18 DIAGNOSIS — Z20822 Contact with and (suspected) exposure to covid-19: Secondary | ICD-10-CM | POA: Insufficient documentation

## 2021-04-18 DIAGNOSIS — M4316 Spondylolisthesis, lumbar region: Secondary | ICD-10-CM | POA: Diagnosis not present

## 2021-04-18 DIAGNOSIS — I10 Essential (primary) hypertension: Secondary | ICD-10-CM | POA: Insufficient documentation

## 2021-04-18 DIAGNOSIS — Z8673 Personal history of transient ischemic attack (TIA), and cerebral infarction without residual deficits: Secondary | ICD-10-CM | POA: Insufficient documentation

## 2021-04-18 DIAGNOSIS — Z7901 Long term (current) use of anticoagulants: Secondary | ICD-10-CM | POA: Insufficient documentation

## 2021-04-18 DIAGNOSIS — Z7982 Long term (current) use of aspirin: Secondary | ICD-10-CM | POA: Insufficient documentation

## 2021-04-18 DIAGNOSIS — D573 Sickle-cell trait: Secondary | ICD-10-CM | POA: Insufficient documentation

## 2021-04-18 DIAGNOSIS — J45909 Unspecified asthma, uncomplicated: Secondary | ICD-10-CM | POA: Insufficient documentation

## 2021-04-18 DIAGNOSIS — M48062 Spinal stenosis, lumbar region with neurogenic claudication: Secondary | ICD-10-CM | POA: Insufficient documentation

## 2021-04-18 DIAGNOSIS — Z881 Allergy status to other antibiotic agents status: Secondary | ICD-10-CM | POA: Diagnosis not present

## 2021-04-18 DIAGNOSIS — M189 Osteoarthritis of first carpometacarpal joint, unspecified: Secondary | ICD-10-CM | POA: Diagnosis not present

## 2021-04-18 DIAGNOSIS — K219 Gastro-esophageal reflux disease without esophagitis: Secondary | ICD-10-CM | POA: Insufficient documentation

## 2021-04-18 DIAGNOSIS — E118 Type 2 diabetes mellitus with unspecified complications: Secondary | ICD-10-CM | POA: Insufficient documentation

## 2021-04-18 DIAGNOSIS — Z79899 Other long term (current) drug therapy: Secondary | ICD-10-CM | POA: Diagnosis not present

## 2021-04-18 DIAGNOSIS — I480 Paroxysmal atrial fibrillation: Secondary | ICD-10-CM | POA: Insufficient documentation

## 2021-04-18 DIAGNOSIS — M48061 Spinal stenosis, lumbar region without neurogenic claudication: Secondary | ICD-10-CM | POA: Diagnosis not present

## 2021-04-18 DIAGNOSIS — M47816 Spondylosis without myelopathy or radiculopathy, lumbar region: Secondary | ICD-10-CM | POA: Diagnosis not present

## 2021-04-18 DIAGNOSIS — Z888 Allergy status to other drugs, medicaments and biological substances status: Secondary | ICD-10-CM | POA: Diagnosis not present

## 2021-04-18 DIAGNOSIS — M19041 Primary osteoarthritis, right hand: Secondary | ICD-10-CM | POA: Diagnosis not present

## 2021-04-18 DIAGNOSIS — M47812 Spondylosis without myelopathy or radiculopathy, cervical region: Secondary | ICD-10-CM | POA: Diagnosis not present

## 2021-04-18 DIAGNOSIS — E7800 Pure hypercholesterolemia, unspecified: Secondary | ICD-10-CM | POA: Diagnosis not present

## 2021-04-18 DIAGNOSIS — Z538 Procedure and treatment not carried out for other reasons: Secondary | ICD-10-CM | POA: Diagnosis not present

## 2021-04-18 DIAGNOSIS — F4321 Adjustment disorder with depressed mood: Secondary | ICD-10-CM | POA: Diagnosis not present

## 2021-04-18 DIAGNOSIS — J4520 Mild intermittent asthma, uncomplicated: Secondary | ICD-10-CM | POA: Diagnosis not present

## 2021-04-18 DIAGNOSIS — Z01818 Encounter for other preprocedural examination: Secondary | ICD-10-CM | POA: Insufficient documentation

## 2021-04-18 DIAGNOSIS — M4726 Other spondylosis with radiculopathy, lumbar region: Secondary | ICD-10-CM | POA: Diagnosis not present

## 2021-04-18 DIAGNOSIS — E1169 Type 2 diabetes mellitus with other specified complication: Secondary | ICD-10-CM | POA: Diagnosis not present

## 2021-04-18 HISTORY — DX: Headache, unspecified: R51.9

## 2021-04-18 HISTORY — DX: Cardiac arrhythmia, unspecified: I49.9

## 2021-04-18 HISTORY — DX: Bronchitis, not specified as acute or chronic: J40

## 2021-04-18 HISTORY — DX: Anxiety disorder, unspecified: F41.9

## 2021-04-18 HISTORY — DX: Cerebral infarction, unspecified: I63.9

## 2021-04-18 HISTORY — DX: Unspecified asthma, uncomplicated: J45.909

## 2021-04-18 HISTORY — DX: Pneumonia, unspecified organism: J18.9

## 2021-04-18 HISTORY — DX: Angina pectoris, unspecified: I20.9

## 2021-04-18 LAB — BASIC METABOLIC PANEL
Anion gap: 8 (ref 5–15)
BUN: 10 mg/dL (ref 8–23)
CO2: 29 mmol/L (ref 22–32)
Calcium: 9.7 mg/dL (ref 8.9–10.3)
Chloride: 101 mmol/L (ref 98–111)
Creatinine, Ser: 0.7 mg/dL (ref 0.44–1.00)
GFR, Estimated: >60 mL/min (ref >60)
Glucose, Bld: 180 mg/dL — ABNORMAL HIGH (ref 70–99)
Potassium: 3.6 mmol/L (ref 3.5–5.1)
Sodium: 138 mmol/L (ref 135–145)

## 2021-04-18 LAB — CBC
HCT: 42.7 % (ref 36.0–46.0)
Hemoglobin: 14.3 g/dL (ref 12.0–15.0)
MCH: 29.4 pg (ref 26.0–34.0)
MCHC: 33.5 g/dL (ref 30.0–36.0)
MCV: 87.9 fL (ref 80.0–100.0)
Platelets: 303 10*3/uL (ref 150–400)
RBC: 4.86 MIL/uL (ref 3.87–5.11)
RDW: 13.6 % (ref 11.5–15.5)
WBC: 7.8 10*3/uL (ref 4.0–10.5)
nRBC: 0 % (ref 0.0–0.2)

## 2021-04-18 LAB — SURGICAL PCR SCREEN
MRSA, PCR: NEGATIVE
Staphylococcus aureus: NEGATIVE

## 2021-04-18 LAB — TYPE AND SCREEN
ABO/RH(D): B POS
Antibody Screen: NEGATIVE

## 2021-04-18 LAB — HEMOGLOBIN A1C
Hgb A1c MFr Bld: 7.2 % — ABNORMAL HIGH (ref 4.8–5.6)
Mean Plasma Glucose: 159.94 mg/dL

## 2021-04-18 LAB — SARS CORONAVIRUS 2 (TAT 6-24 HRS): SARS Coronavirus 2: NEGATIVE

## 2021-04-18 LAB — GLUCOSE, CAPILLARY: Glucose-Capillary: 185 mg/dL — ABNORMAL HIGH (ref 70–99)

## 2021-04-18 NOTE — Progress Notes (Signed)
PCP - Daisy Floro, MD Cardiologist - Pt denies a current cardiologist; states she used to see one more than 5 years ago for Pre-atrial contrctions, but no longer sees them. Pt does not remember who she used to see. Endocrinologist- Marlene Lard, MD Neurologist- Johnnette Gourd, MD; Coral Ceo, MD  PPM/ICD - Denies  Chest x-ray - N/A EKG - 04/18/21 Stress Test - Per pt. More than 5 years ago by her former cardiologist; results were negative. ECHO - Denies Cardiac Cath - Denies  Sleep Study - 01/26/21; negative for OSA   Fasting Blood Sugar: 147 Checks Blood Sugar continuously via Libre A1C obtained  Blood Thinner Instructions: N/A Aspirin Instructions: Stop today.  ERAS Protcol - N/A PRE-SURGERY Ensure or G2- N/A  COVID TEST- 04/18/21; pending   Anesthesia review: Yes, cardiac, and Neuro hx.  Patient denies shortness of breath, fever, cough and chest pain at PAT appointment   All instructions explained to the patient, with a verbal understanding of the material. Patient agrees to go over the instructions while at home for a better understanding. Patient also instructed to self quarantine after being tested for COVID-19. The opportunity to ask questions was provided.

## 2021-04-19 ENCOUNTER — Inpatient Hospital Stay (HOSPITAL_COMMUNITY): Payer: HMO | Admitting: Vascular Surgery

## 2021-04-19 ENCOUNTER — Encounter (HOSPITAL_COMMUNITY): Payer: Self-pay

## 2021-04-19 ENCOUNTER — Inpatient Hospital Stay (HOSPITAL_COMMUNITY): Payer: HMO | Admitting: Certified Registered Nurse Anesthetist

## 2021-04-19 NOTE — Anesthesia Preprocedure Evaluation (Deleted)
Anesthesia Evaluation  Patient identified by MRN, date of birth, ID band Patient awake    Reviewed: Allergy & Precautions, H&P , NPO status , Patient's Chart, lab work & pertinent test results, reviewed documented beta blocker date and time   Airway Mallampati: II  TM Distance: >3 FB Neck ROM: Full    Dental no notable dental hx. (+) Teeth Intact, Dental Advisory Given   Pulmonary asthma ,    Pulmonary exam normal breath sounds clear to auscultation       Cardiovascular hypertension, Pt. on medications and Pt. on home beta blockers  Rhythm:Regular Rate:Normal     Neuro/Psych  Headaches, Anxiety Depression CVA, No Residual Symptoms    GI/Hepatic Neg liver ROS, GERD  Medicated,  Endo/Other  diabetes, Insulin Dependent  Renal/GU negative Renal ROS  negative genitourinary   Musculoskeletal  (+) Arthritis , Osteoarthritis,    Abdominal   Peds  Hematology negative hematology ROS (+)   Anesthesia Other Findings   Reproductive/Obstetrics negative OB ROS                           Anesthesia Physical Anesthesia Plan  ASA: III  Anesthesia Plan: General   Post-op Pain Management:    Induction: Intravenous  PONV Risk Score and Plan: 4 or greater and Ondansetron, Dexamethasone and Midazolam  Airway Management Planned: Oral ETT  Additional Equipment:   Intra-op Plan:   Post-operative Plan: Extubation in OR  Informed Consent: I have reviewed the patients History and Physical, chart, labs and discussed the procedure including the risks, benefits and alternatives for the proposed anesthesia with the patient or authorized representative who has indicated his/her understanding and acceptance.     Dental advisory given  Plan Discussed with: CRNA  Anesthesia Plan Comments: (PAT note written 04/19/2021 by Shonna Chock, PA-C.  Pt cancelled her surgery. Not comfortable with planned procedure.)       Anesthesia Quick Evaluation

## 2021-04-19 NOTE — Progress Notes (Signed)
Anesthesia Chart Review:  Case: [id number] Date/Time: 04/20/21 1125   Procedure: Lumbar 4-5 Posterior lumbar interbody fusion/interbody prosthesis/posterior instrumentation (N/A ) - 3C/RM 20   Anesthesia type: General   Pre-op diagnosis: Spinal stenosis, Lumbar region with neurogenic claudication   [facility] ROOM 20 / [facility] OR   Surgeons: Tressie Stalker, MD      DISCUSSION: Patient is a 70 year old female scheduled for the above procedure. By neurosurgery notes, she has had back issues for about 5 years, but worsening over the past year with 3 falls, last in 11/2020. Work-up included MRI lumbar spine and hip which showed small partial right labral tear. Lumbar MRI showed spondylolisthesis, facet arthropathy and stenosis. PT made symptoms worse and steroids didn't seem to help and caused hyperglycemia (~ 400).   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  I called and spoke with patient. She reported previous cardiology evaluation in the 1970's (Dr. Laurence Ferrari in [place], [place]) for paroxsymal atrial tachycardia that was found on Holter monitor. She says she was morbidly obese at that time and symptoms improved with weight loss, although she remains on verapamil (requiries brand specific medication: Verapamil Actavis brand 240mg  Q 24 hours). She said she was more recently evaluated at [facility] (now a [facility]) ~ 5-10 years ago for chest pain with family history of CAD (brother died of MI age 54 with autopsy showing 90% LAD, had underlying risk factors; another brother died in his 19's but in the setting of "ice" use). She  says she had an ETT that had to be converted to a nuclear stress test as part of that evaluation. She is not currently followed by cardiology. She denied chest pain, SOB, syncope. 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 She is also involved in prison ministries. She does have to use albuterol occasional for intermittent wheezing which is attributed to "allergies". Her A1c is 7.2%, she says this is up from 6.8% but had been on several courses of steroids which had to be tapered after she developed significant hyperglycemia while on them. She said last steroids ~ 2 months ago. She has a Libre glucose monitor.  She has had at least non-specific anterolateral EKG changes dating back to 2003. Current EKG appears similar to 07/27/14. She denied CV symptoms. Anesthesia team to evaluate on the day of surgery. 04/18/21 presurgical COVID-19 test negative.    VS: BP 137/82   Pulse 88   Temp 37.1 C (Oral)   Resp 18   Ht 5' (1.524 m)   Wt 69.4 kg   SpO2 99%   BMI 29.89 kg/m    PROVIDERS: 04/20/21, MD is PCP  Daisy Floro, MD is endocrinologist Dohmeier, Marlene Lard, MD is neurologist Porfirio Mylar, MD is neurologist. Last visit 11/16/20 for memory changes. Initial work-up including sleep study ordered (with Dohmeier, 11/18/20, MD) which did not show evidence of OSA.   LABS: Labs reviewed: Acceptable for surgery. (all labs ordered are  listed, but only abnormal results are displayed)  Labs Reviewed  GLUCOSE, CAPILLARY - Abnormal; Notable for the following components:      Result Value   Glucose-Capillary 185 (*)    All other components within normal limits  HEMOGLOBIN A1C - Abnormal; Notable for the following components:   Hgb A1c MFr Bld 7.2 (*)    All other components within  normal limits  BASIC METABOLIC PANEL - Abnormal; Notable for the following components:   Glucose, Bld 180 (*)    All other components within normal limits  SURGICAL PCR SCREEN  SARS CORONAVIRUS 2 (TAT 6-24 HRS)  CBC  TYPE AND SCREEN    Baseline Polysomnogram 01/26/21: IMPRESSION: 1. No evidence of hypoxia, of Obstructive Sleep Apnea (OSA). 2. All arousals were spontaneous and unrelated to physiological factors. There was one prolonged period of wakefulness beginning at 1 AM until 2.30 AM. Cause at onset was neck pain and there was coughing during this period.  - RECOMMENDATIONS: No physiological sleep disorder identified.  Consider cognitive behaviour therapy for insomnia.   IMAGES: MRI L-spine 03/05/21: IMPRESSION: 1. Subtle anterolisthesis at L4-L5 with moderate to severe facet arthropathy including a small 4-5 mm anteriorly directed synovial cyst from the right facet toward the right lateral recess. Query Right L5 radiculitis. Moderate multifactorial spinal stenosis at that level. 2. Mild for age lumbar spine degeneration elsewhere. 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  No acute abnormalities are noted. - Dr. Lucia Gaskins recommended ASA and control of comorbidies   EKG: 04/18/21: Normal sinus rhythm Nonspecific T wave abnormality Abnormal ECG Since last tracing [in [facility] from 07/04/04] lateral ST changes are more pronounced Confirmed by Lance Muss [phone]) on 04/18/2021 5:58:18 PM - EKG appears similar to 07/27/14 tracing.    CV: She reported a nuclear stress test ~ 5-10 years ago through [facility] (now a [facility]). Phone extension to request old records from [facility] was not in service.    Past  Medical History:  Diagnosis Date  . Anxiety   . Asthma   . Bronchitis   . Carpal tunnel syndrome on both sides    left worse than right  . Degenerative arthritis of cervical spine   . Depressive disorder, not elsewhere classified   . Diabetes (HCC)   . Dysrhythmia    PAT  . Family history of heart disease    with sudden death  . GERD (gastroesophageal reflux disease)   . Headache    migraines  . History of asthma    no longer requires med., per pt.  . History of kidney stones   . Hypercholesteremia   . Hyperlipidemia   . Hypertension    states under control with meds., has been on med. since 1990s  . Immature cataract of both eyes   . Memory difficulty   . Non-insulin dependent type 2 diabetes mellitus (HCC)   . PAT (paroxysmal atrial tachycardia) (HCC)   . Pneumonia   . 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 Sickle cell trait (HCC)   . Stroke [facility])    exact date unknown  . Vitamin D deficiency     Past Surgical History:  Procedure Laterality Date  . BREAST LUMPECTOMY WITH RADIOACTIVE SEED LOCALIZATION Left 01/30/2018  Procedure: LEFT BREAST BRACKETED LUMPECTOMY WITH RADIOACTIVE SEED LOCALIZATION;  Surgeon: Abigail Miyamoto, MD;  Location: [facility];  Service: General;  Laterality: Left;  . BREAST SURGERY Left 2009   calcification to left breast  . CESAREAN SECTION  1982  . DILATION AND CURETTAGE OF UTERUS    . ENDOMETRIAL ABLATION  1991  . TUBAL LIGATION  1982    MEDICATIONS: . acetaminophen (TYLENOL) 500 MG tablet  . albuterol (VENTOLIN HFA) 108 (90 Base) MCG/ACT inhaler  . aspirin EC 81 MG tablet  . b complex vitamins capsule  . Betamethasone Dipropionate 0.05 % EMUL  . carvedilol (COREG) 6.25 MG tablet  . Cholecalciferol (VITAMIN D-3) 125 MCG (5000 UT) TABS  . Continuous Blood Gluc Receiver (FREESTYLE LIBRE 14 DAY READER) DEVI  . Continuous Blood Gluc Sensor (FREESTYLE LIBRE SENSOR SYSTEM) MISC  . diphenhydramine-acetaminophen  (TYLENOL PM) 25-500 MG TABS tablet  . Dulaglutide 1.5 MG/0.5ML SOPN  . Fexofenadine HCl (ALLEGRA PO)  . fluticasone (FLONASE) 50 MCG/ACT nasal spray  . ibuprofen (ADVIL) 600 MG tablet  . lansoprazole (PREVACID) 30 MG capsule  . LORazepam (ATIVAN) 0.5 MG tablet  . MINERAL OIL PO  . Misc Natural Products (GLUCOSAMINE CHOND MSM FORMULA) TABS  . nystatin-triamcinolone ointment (MYCOLOG)  . ondansetron (ZOFRAN) 4 MG tablet  . telmisartan-hydrochlorothiazide (MICARDIS HCT) 80-12.5 MG tablet  . tiZANidine (ZANAFLEX) 2 MG tablet  . traMADol (ULTRAM) 50 MG tablet  . Turmeric 450 MG CAPS  . verapamil (VERELAN PM) 240 MG 24 hr capsule   No current facility-administered medications for this encounter.    Shonna Chock, PA-C Surgical Short Stay/Anesthesiology [facility] Phone [phone] [facility] Phone [phone] 04/19/2021 2:39 PM

## 2021-04-20 ENCOUNTER — Encounter (HOSPITAL_COMMUNITY): Payer: Self-pay | Admitting: Neurosurgery

## 2021-04-20 ENCOUNTER — Inpatient Hospital Stay (HOSPITAL_COMMUNITY): Payer: HMO

## 2021-04-20 ENCOUNTER — Other Ambulatory Visit: Payer: Self-pay

## 2021-04-20 ENCOUNTER — Ambulatory Visit (HOSPITAL_COMMUNITY)
Admission: RE | Admit: 2021-04-20 | Discharge: 2021-04-20 | Disposition: A | Discharge status: Home / Self Care | Payer: HMO | Source: Ambulatory Visit | Attending: Neurosurgery | Admitting: Neurosurgery

## 2021-04-20 ENCOUNTER — Encounter (HOSPITAL_COMMUNITY)
Admission: RE | Disposition: A | Discharge status: Home / Self Care | Payer: Self-pay | Source: Ambulatory Visit | Attending: Neurosurgery

## 2021-04-20 DIAGNOSIS — Z79899 Other long term (current) drug therapy: Secondary | ICD-10-CM | POA: Insufficient documentation

## 2021-04-20 DIAGNOSIS — Z20822 Contact with and (suspected) exposure to covid-19: Secondary | ICD-10-CM | POA: Insufficient documentation

## 2021-04-20 DIAGNOSIS — Z888 Allergy status to other drugs, medicaments and biological substances status: Secondary | ICD-10-CM | POA: Insufficient documentation

## 2021-04-20 DIAGNOSIS — Z419 Encounter for procedure for purposes other than remedying health state, unspecified: Secondary | ICD-10-CM

## 2021-04-20 DIAGNOSIS — M4316 Spondylolisthesis, lumbar region: Secondary | ICD-10-CM | POA: Diagnosis not present

## 2021-04-20 DIAGNOSIS — M48061 Spinal stenosis, lumbar region without neurogenic claudication: Secondary | ICD-10-CM | POA: Insufficient documentation

## 2021-04-20 DIAGNOSIS — Z881 Allergy status to other antibiotic agents status: Secondary | ICD-10-CM | POA: Insufficient documentation

## 2021-04-20 DIAGNOSIS — Z538 Procedure and treatment not carried out for other reasons: Secondary | ICD-10-CM | POA: Insufficient documentation

## 2021-04-20 DIAGNOSIS — M4726 Other spondylosis with radiculopathy, lumbar region: Secondary | ICD-10-CM | POA: Insufficient documentation

## 2021-04-20 DIAGNOSIS — Z880 Allergy status to penicillin: Secondary | ICD-10-CM | POA: Insufficient documentation

## 2021-04-20 DIAGNOSIS — Z8673 Personal history of transient ischemic attack (TIA), and cerebral infarction without residual deficits: Secondary | ICD-10-CM | POA: Insufficient documentation

## 2021-04-20 DIAGNOSIS — Z7982 Long term (current) use of aspirin: Secondary | ICD-10-CM | POA: Insufficient documentation

## 2021-04-20 LAB — GLUCOSE, CAPILLARY: Glucose-Capillary: 145 mg/dL — ABNORMAL HIGH (ref 70–99)

## 2021-04-20 LAB — ABO/RH: ABO/RH(D): B POS

## 2021-04-20 SURGERY — CANCELLED PROCEDURE
Anesthesia: General

## 2021-04-20 MED ORDER — PROPOFOL 10 MG/ML IV BOLUS
INTRAVENOUS | Status: AC
  Filled 2021-04-20: qty 20

## 2021-04-20 MED ORDER — FENTANYL CITRATE (PF) 250 MCG/5ML IJ SOLN
INTRAMUSCULAR | Status: AC
  Filled 2021-04-20: qty 5

## 2021-04-20 MED ORDER — SUCCINYLCHOLINE CHLORIDE 200 MG/10ML IV SOSY
PREFILLED_SYRINGE | INTRAVENOUS | Status: AC
  Filled 2021-04-20: qty 10

## 2021-04-20 MED ORDER — BUPIVACAINE-EPINEPHRINE (PF) 0.25% -1:200000 IJ SOLN
INTRAMUSCULAR | Status: AC
  Filled 2021-04-20: qty 30

## 2021-04-20 MED ORDER — ROCURONIUM BROMIDE 10 MG/ML (PF) SYRINGE
PREFILLED_SYRINGE | INTRAVENOUS | Status: AC
  Filled 2021-04-20: qty 10

## 2021-04-20 MED ORDER — THROMBIN 20000 UNITS EX SOLR
CUTANEOUS | Status: AC
  Filled 2021-04-20: qty 20000

## 2021-04-20 MED ORDER — THROMBIN 5000 UNITS EX SOLR
CUTANEOUS | Status: AC
  Filled 2021-04-20: qty 5000

## 2021-04-20 MED ORDER — CHLORHEXIDINE GLUCONATE CLOTH 2 % EX PADS: 6.0000 | MEDICATED_PAD | Freq: Once | CUTANEOUS | Status: DC

## 2021-04-20 MED ORDER — PHENYLEPHRINE 40 MCG/ML (10ML) SYRINGE FOR IV PUSH (FOR BLOOD PRESSURE SUPPORT)
PREFILLED_SYRINGE | INTRAVENOUS | Status: AC
  Filled 2021-04-20: qty 10

## 2021-04-20 MED ORDER — ORAL CARE MOUTH RINSE: 15.0000 mL | Freq: Once | OROMUCOSAL | Status: AC

## 2021-04-20 MED ORDER — VANCOMYCIN HCL IN DEXTROSE 1-5 GM/200ML-% IV SOLN
1000.0000 mg | INTRAVENOUS | Status: AC
  Administered 2021-04-20: 1000 mg via INTRAVENOUS
  Filled 2021-04-20: qty 200

## 2021-04-20 MED ORDER — ONDANSETRON HCL 4 MG/2ML IJ SOLN
INTRAMUSCULAR | Status: AC
  Filled 2021-04-20: qty 4

## 2021-04-20 MED ORDER — BUPIVACAINE LIPOSOME 1.3 % IJ SUSP
INTRAMUSCULAR | Status: AC
  Filled 2021-04-20: qty 20

## 2021-04-20 MED ORDER — ARTIFICIAL TEARS OPHTHALMIC OINT
TOPICAL_OINTMENT | OPHTHALMIC | Status: AC
  Filled 2021-04-20: qty 3.5

## 2021-04-20 MED ORDER — LIDOCAINE 2% (20 MG/ML) 5 ML SYRINGE
INTRAMUSCULAR | Status: AC
  Filled 2021-04-20: qty 5

## 2021-04-20 MED ORDER — ACETAMINOPHEN 500 MG PO TABS
1000.0000 mg | ORAL_TABLET | Freq: Once | ORAL | Status: AC
  Administered 2021-04-20: 1000 mg via ORAL
  Filled 2021-04-20: qty 2

## 2021-04-20 MED ORDER — CHLORHEXIDINE GLUCONATE 0.12 % MT SOLN
15.0000 mL | Freq: Once | OROMUCOSAL | Status: AC
  Administered 2021-04-20: 15 mL via OROMUCOSAL
  Filled 2021-04-20: qty 15

## 2021-04-20 MED ORDER — EPHEDRINE 5 MG/ML INJ
INTRAVENOUS | Status: AC
  Filled 2021-04-20: qty 10

## 2021-04-20 MED ORDER — MIDAZOLAM HCL 2 MG/2ML IJ SOLN
INTRAMUSCULAR | Status: AC
  Filled 2021-04-20: qty 2

## 2021-04-20 MED ORDER — LACTATED RINGERS IV SOLN: INTRAVENOUS | Status: DC

## 2021-04-20 MED ORDER — BACITRACIN ZINC 500 UNIT/GM EX OINT
TOPICAL_OINTMENT | CUTANEOUS | Status: AC
  Filled 2021-04-20: qty 28.35

## 2021-04-20 MED ORDER — ROCURONIUM BROMIDE 10 MG/ML (PF) SYRINGE
PREFILLED_SYRINGE | INTRAVENOUS | Status: AC
  Filled 2021-04-20: qty 20

## 2021-04-20 MED ORDER — CEFAZOLIN SODIUM 1 G IJ SOLR
INTRAMUSCULAR | Status: AC
  Filled 2021-04-20: qty 20

## 2021-04-20 NOTE — H&P (Signed)
Subjective: The patient is a 70 year old black female who is complained of back and right greater left leg pain consistent with neurogenic claudication/lumbar radiculopathy.  She failed medical management and was worked up with lumbar x-rays and lumbar MRI which demonstrated an L4-5 spondylolisthesis, spinal stenosis, etc.  I discussed the various treatment options with her.  She has decided proceed with surgery.  Past Medical History:  Diagnosis Date  . Anxiety   . Asthma   . Bronchitis   . Carpal tunnel syndrome on both sides    left worse than right  . Degenerative arthritis of cervical spine   . Depressive disorder, not elsewhere classified   . Diabetes (HCC)   . Family history of heart disease    younger brother died from MI age 69  . GERD (gastroesophageal reflux disease)   . Headache    migraines  . History of asthma    no longer requires med., per pt.  . History of kidney stones   . Hypercholesteremia   . Hyperlipidemia   . Hypertension    states under control with meds., has been on med. since 1990s  . Immature cataract of both eyes   . Memory difficulty   . Non-insulin dependent type 2 diabetes mellitus (HCC)   . PAT (paroxysmal atrial tachycardia) (HCC) 1970's   treated with Verapamil  . Pneumonia   . 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 Sickle cell trait (HCC)   . Stroke [facility])    exact date unknown  . Vitamin D deficiency     Past Surgical History:  Procedure Laterality Date  . BREAST LUMPECTOMY WITH RADIOACTIVE SEED LOCALIZATION Left 01/30/2018   Procedure: LEFT BREAST BRACKETED LUMPECTOMY WITH RADIOACTIVE SEED LOCALIZATION;  Surgeon: Abigail Miyamoto, MD;  Location: [facility];  Service: General;  Laterality: Left;  . BREAST SURGERY Left 2009   calcification to left breast  . CESAREAN SECTION  1982  . DILATION AND CURETTAGE OF UTERUS    . ENDOMETRIAL ABLATION  1991  . TUBAL LIGATION  1982    Allergies  Allergen Reactions  .  Accupril [Quinapril Hcl] Other (See Comments) and Cough    NUMBNESS AND TINGLING  . Penicillins Hives    Reaction: Childhood  . Cephalosporins Hives and Itching  . Pioglitazone     Other reaction(s): Numbness  . Rosuvastatin     Other reaction(s): Myalgias  . Nitroglycerin Rash    PATCH Other reaction(s): Itch    Social History   Tobacco Use  . Smoking status: Never Smoker  . Smokeless tobacco: Never Used  Substance Use Topics  . Alcohol use: No    Family History  Problem Relation Age of Onset  . Diabetes Mother        DM  . Hypertension Father   . Diabetes Father        DM  . Heart attack Brother   . CAD Brother   . Heart disease Paternal Grandfather   . Diabetes Brother        DM  . Heart attack Brother   . CAD Brother   . Lupus Other        Family H/O of Lupus  . Breast cancer Cousin        unsure of age  . Dementia Neg Hx   . Alzheimer's disease Neg Hx    Prior to Admission medications   Medication Sig Start Date End Date Taking? Authorizing Provider  acetaminophen (TYLENOL) 500 MG tablet  Take 1,000 mg by mouth every 6 (six) hours as needed for moderate pain or mild pain.   Yes [provider]  albuterol (VENTOLIN HFA) 108 (90 Base) MCG/ACT inhaler Inhale 1-2 puffs into the lungs every 6 (six) hours as needed for wheezing or shortness of breath.   Yes [provider]  aspirin EC 81 MG tablet Take 81 mg by mouth daily.   Yes [provider]  b complex vitamins capsule Take 1 capsule by mouth every Tuesday, Thursday, and Saturday at 6 PM.   Yes [provider]  Betamethasone Dipropionate 0.05 % EMUL 2 drops 2 (two) times daily. Both ear   Yes [provider]  carvedilol (COREG) 6.25 MG tablet Take 6.25 mg by mouth 2 (two) times daily with a meal.   Yes [provider]  Cholecalciferol (VITAMIN D-3) 125 MCG (5000 UT) TABS Take 5,000 Units by mouth every Monday, Wednesday, and Friday.   Yes [provider]  Continuous Blood Gluc Receiver (FREESTYLE LIBRE 14 DAY READER) DEVI Inject 1 application into the skin every 14 (fourteen) days.   Yes [provider]  diphenhydramine-acetaminophen (TYLENOL PM) 25-500 MG TABS tablet Take 1 tablet by mouth at bedtime as needed (Sleep).   Yes [provider]  Dulaglutide 1.5 MG/0.5ML SOPN Inject 1.5 mg into the skin once a week. Wednesday   Yes [provider]  Fexofenadine HCl (ALLEGRA PO) Take 180 mg by mouth daily as needed (allergies). Takes for a month- alternates with Claritin each month.   Yes [provider]  fluticasone (FLONASE) 50 MCG/ACT nasal spray Place 1 spray into both nostrils daily as needed for allergies or rhinitis.   Yes [provider]  ibuprofen (ADVIL) 600 MG tablet Take 600 mg by mouth daily as needed for moderate pain or mild pain. 11/01/16  Yes [provider]  lansoprazole (PREVACID) 30 MG capsule Take 30 mg by mouth every evening.   Yes [provider]  LORazepam (ATIVAN) 0.5 MG tablet Take 0.5 mg by mouth every 8 (eight) hours as needed for anxiety.   Yes [provider]  MINERAL OIL PO Place 1 drop into both ears once a week.   Yes [provider]  Misc Natural Products (GLUCOSAMINE CHOND MSM FORMULA) TABS Take 1 tablet by mouth 2 (two) times daily.   Yes [provider]  nystatin-triamcinolone ointment (MYCOLOG) Apply 1 application topically 2 (two) times daily. Both ear   Yes [provider]  ondansetron (ZOFRAN) 4 MG tablet Take 4 mg by mouth every 6 (six) hours as needed for nausea.   Yes [provider]  telmisartan-hydrochlorothiazide (MICARDIS HCT) 80-12.5 MG tablet Take 1 tablet by mouth daily.   Yes [provider]  tiZANidine (ZANAFLEX) 2 MG tablet Take 2-4 mg by mouth 3 (three) times daily as needed for muscle spasms. 03/29/21  Yes [provider]  traMADol (ULTRAM) 50 MG tablet Take 50-100 mg by mouth  3 (three) times daily as needed for pain. 02/03/21  Yes [provider]  Turmeric 450 MG CAPS Take 450 mg by mouth daily.   Yes [provider]  verapamil (VERELAN PM) 240 MG 24 hr capsule Take 240 mg by mouth at bedtime. CAN ONLY TAKE ACTAVIS BRAND YELLOW AND BLUE CAPSULE 01/16/21  Yes [provider]  Continuous Blood Gluc Sensor (FREESTYLE LIBRE SENSOR SYSTEM) MISC by Does not apply route.    [provider]     Review of Systems  Positive ROS: As above  All other systems have been reviewed and were otherwise negative with the exception of those mentioned in the HPI and as above.  Objective: Vital signs in last 24 hours: Temp:  [98.6 F (37 C)] 98.6 F (37 C) (04/28 0918) Pulse Rate:  [84] 84 (04/28 0918) Resp:  [20] 20 (04/28 0918) BP: (141)/(77) 141/77 (04/28 0918) SpO2:  [98 %] 98 % (04/28 0918) Weight:  [69.4 kg] 69.4 kg (04/28 0918) Estimated body mass index is 29.88 kg/m as calculated from the following:   Height as of this encounter: 5' (1.524 m).   Weight as of this encounter: 69.4 kg.   General Appearance: Alert Head: Normocephalic, without obvious abnormality, atraumatic Eyes: PERRL, conjunctiva/corneas clear, EOM's intact,    Ears: Normal  Throat: Normal  Neck: Supple, Back: unremarkable Lungs: Clear to auscultation bilaterally, respirations unlabored Heart: Regular rate and rhythm, no murmur, rub or gallop Abdomen: Soft, non-tender Extremities: Extremities normal, atraumatic, no cyanosis or edema Skin: unremarkable  NEUROLOGIC:   Mental status: alert and oriented,Motor Exam - grossly normal Sensory Exam - grossly normal Reflexes:  Coordination - grossly normal Gait - grossly normal Balance - grossly normal Cranial Nerves: I: smell Not tested  II: visual acuity  OS: Normal  OD: Normal   II: visual fields Full to confrontation  II: pupils Equal, round, reactive to light  III,VII: ptosis None  III,IV,VI: extraocular  muscles  Full ROM  V: mastication Normal  V: facial light touch sensation  Normal  V,VII: corneal reflex  Present  VII: facial muscle function - upper  Normal  VII: facial muscle function - lower Normal  VIII: hearing Not tested  IX: soft palate elevation  Normal  IX,X: gag reflex Present  XI: trapezius strength  5/5  XI: sternocleidomastoid strength 5/5  XI: neck flexion strength  5/5  XII: tongue strength  Normal    Data Review Lab Results  Component Value Date   WBC 7.8 04/18/2021   HGB 14.3 04/18/2021   HCT 42.7 04/18/2021   MCV 87.9 04/18/2021   PLT 303 04/18/2021   Lab Results  Component Value Date   NA 138 04/18/2021   K 3.6 04/18/2021   CL 101 04/18/2021   CO2 29 04/18/2021   BUN 10 04/18/2021   CREATININE 0.70 04/18/2021   GLUCOSE 180 (H) 04/18/2021   No results found for: INR, PROTIME  Assessment/Plan: L4-5 spondylolisthesis, spinal stenosis, facet arthropathy, cervical cyst, lumbago, lumbar radiculopathy: I have discussed the situation with the patient.  I reviewed her MRI scan with her and pointed out the abnormalities.  We have discussed the various treatment options including surgery.  I have described the surgical treatment option of an L4-5 decompression, instrumentation and fusion.  I have shown her surgical models.  I have given her a surgical pamphlet.  We have discussed the risk, benefits, alternatives, expected postoperative course, and likelihood of achieving our goals with surgery.  I have answered all her questions.  She has decided proceed with surgery.   Cristi Loron 04/20/2021 11:04 AM

## 2021-04-20 NOTE — Progress Notes (Signed)
During handoff, pt stated to this RN and CRNA, [facility], that she was was uncomfortable with proceeding to the OR undergoing the lumbar fusion procedure. Pt stated that she was under the impression that she was undergoing a laminotomy. Dr. Lovell Sheehan was called back into the room and agreed that the case should be cancelled and advised that the pt be revaluated in the office. Pt agreed and will be discharged to home. IV removed, and pt's transportation called. All pt's concerns have been addressed at this time.   Viviano Simas, RN

## 2021-04-20 NOTE — Progress Notes (Signed)
The patient tells me she was under the impression that we were going to just do a laminotomy and not a decompression, instrumentation and fusion as scheduled.  Both the patient and I agree that it is best to cancel her surgery and for her to follow-up with the office to further discuss the situation.

## 2021-04-25 DIAGNOSIS — N390 Urinary tract infection, site not specified: Secondary | ICD-10-CM | POA: Diagnosis not present

## 2021-04-28 DIAGNOSIS — H6981 Other specified disorders of Eustachian tube, right ear: Secondary | ICD-10-CM | POA: Diagnosis not present

## 2021-04-28 DIAGNOSIS — H938X1 Other specified disorders of right ear: Secondary | ICD-10-CM | POA: Diagnosis not present

## 2021-05-01 DIAGNOSIS — E7800 Pure hypercholesterolemia, unspecified: Secondary | ICD-10-CM | POA: Diagnosis not present

## 2021-05-01 DIAGNOSIS — E1169 Type 2 diabetes mellitus with other specified complication: Secondary | ICD-10-CM | POA: Diagnosis not present

## 2021-05-01 DIAGNOSIS — I10 Essential (primary) hypertension: Secondary | ICD-10-CM | POA: Diagnosis not present

## 2021-05-01 DIAGNOSIS — F4321 Adjustment disorder with depressed mood: Secondary | ICD-10-CM | POA: Diagnosis not present

## 2021-05-01 DIAGNOSIS — K219 Gastro-esophageal reflux disease without esophagitis: Secondary | ICD-10-CM | POA: Diagnosis not present

## 2021-05-01 DIAGNOSIS — J4520 Mild intermittent asthma, uncomplicated: Secondary | ICD-10-CM | POA: Diagnosis not present

## 2021-05-02 DIAGNOSIS — Z6829 Body mass index (BMI) 29.0-29.9, adult: Secondary | ICD-10-CM | POA: Diagnosis not present

## 2021-05-02 DIAGNOSIS — E785 Hyperlipidemia, unspecified: Secondary | ICD-10-CM | POA: Diagnosis not present

## 2021-05-02 DIAGNOSIS — E1169 Type 2 diabetes mellitus with other specified complication: Secondary | ICD-10-CM | POA: Diagnosis not present

## 2021-05-02 DIAGNOSIS — I10 Essential (primary) hypertension: Secondary | ICD-10-CM | POA: Diagnosis not present

## 2021-05-02 DIAGNOSIS — Z8673 Personal history of transient ischemic attack (TIA), and cerebral infarction without residual deficits: Secondary | ICD-10-CM | POA: Diagnosis not present

## 2021-05-02 DIAGNOSIS — M4807 Spinal stenosis, lumbosacral region: Secondary | ICD-10-CM | POA: Diagnosis not present

## 2021-05-18 ENCOUNTER — Encounter: Payer: Self-pay | Admitting: Adult Health

## 2021-05-18 ENCOUNTER — Other Ambulatory Visit: Payer: Self-pay

## 2021-05-18 ENCOUNTER — Ambulatory Visit: Payer: HMO | Admitting: Adult Health

## 2021-05-18 VITALS — BP 116/69 | HR 90 | Ht 60.0 in | Wt 155.0 lb

## 2021-05-18 DIAGNOSIS — R413 Other amnesia: Secondary | ICD-10-CM | POA: Diagnosis not present

## 2021-05-18 NOTE — Patient Instructions (Signed)
Your Plan:  Continue to monitor memory Will consider neuropsychological testing of memory worsens If your symptoms worsen or you develop new symptoms please let us know.   Thank you for coming to see [place] at [facility]. I hope we have been able to provide you high quality care today.  You may receive a patient satisfaction survey over the next few weeks. We would appreciate your feedback and comments so that we may continue to improve ourselves and the health of our patients.

## 2021-05-18 NOTE — Progress Notes (Addendum)
PATIENT: Jennifer [name] DOB: 06-05-1951  REASON FOR VISIT: follow up HISTORY FROM: patient  HISTORY OF PRESENT ILLNESS: Today 05/18/21:  Jennifer [name] is a 70 year old female with a history of memory disturbance.  She returns today for follow-up.  She feels that her memory has improved.  She lives at home alone.  She is able to complete all ADLs independently.  She reports that she has some physical limitations with her housework.  Manages her finances.  She manages her appointments and medications.  She had an MRI that was relatively unremarkable with exception of an old infarct.  Sleep study was unremarkable.  Patient returns today for evaluation.  HISTORY 12/27/20: Jennifer [name] a 70 y.o. year old [place] or [place] American female patientseen here as a referralon 12/27/2020 from Dr. Lucia Gaskins.   Chiefconcernaccording to patient : "My memory changed when I lost my husband, March 2021, and there was drama with is children for 2-3 years before."    I have the pleasure of seeing Jennifer [name] today,a right -handed Black or [place] American female with a possible sleep disorder.   Jennifer [name] is a 70 y.o. female here as requested by Daisy Floro, MD for memory changes. PMHx HTN, DM2, HLD, osteoarthritis, back pain, kidney stones, drug-induced myopathy and myalgia, carpal tunnel syndrome, memory changes, grief reaction. I reviewed Dr. Charlott Rakes notes: Patient feels tired, poor sleep, stressed, now on prednisone and her A1c is up to 7.28, no recent lorazepam use, she is worried about her memory she is gotten lost a few times and she is very tired a lot, B12 and thyroid were checked, vitamin D, lipid panel were also checked, vitamin D was normal 62.6, CMP showed creatinine 0.62 and BUN 11 otherwise unremarkable labs were drawn 2020-09-07, B12 was low at 219 we will recheck. TSH normal.   Patient reports ongoing for 2 years, worsening, patient always has to use her GPS, it  was worse several months ago. She is leaving things in places. She has a lot of stress the last few years, forgetting things, simple things, getting worse, she needs a nap every day, husband died this year and there is grief, even cooking she is forgetting, to prepare a big meal is difficult when she cooks for people, she has to write things down, ongoing 2 years, no hx of dementi ain the family that she knows of, her bills are paid, she has to use her GPS, she has to think where she is but not getting lost. No weakness, no sensory changes, husband had dementia and she was his caregiver. No other focal neurologic deficits, associated symptoms, inciting events or modifiable factors.   Sleeprelevant medical history: see above. 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   Familymedical /sleep history:no other family member on CPAP with OSA.  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 She worked mostly night shifts.  She lives in a household alone, widowed. No biological children, Pets are not present. Tobacco use: none ETOH use ; none,  Caffeine intake in form of Coffee( /) Soda( /) Tea ( /) - less than a cup in 3 days.  No regular exercise .       Sleep habits are as follows:The patient's lunch time is between 12-2  PM, and last meal is 5-6 PM.  The patient goes to bed at 6 or at 10 PM, she is actively trying to change that.  and continues to sleep for several hours, wakes for 1-2 bathroom breaks, the first time at 3 AM.    The preferred sleep position is on her left side , with the support of 1 pillow.  Bedroom is cool, quiet and dark.  Dreams are reportedly rare. 5.30 AM is the usual rise time.  The patient wakes up spontaneously. She reports not feeling refreshed or restored in AM, with symptoms such as dry mouth , but no morning headaches , and residual fatigue.  Naps are taken routinely, lasting from 2-3 Pm and is much  more refreshing than nocturnal sleep.    REVIEW OF SYSTEMS: Out of a complete 14 system review of symptoms, the patient complains only of the following symptoms, and all other reviewed systems are negative.  ALLERGIES: Allergies  Allergen Reactions  . Accupril [Quinapril Hcl] Other (See Comments) and Cough    NUMBNESS AND TINGLING  . Penicillins Hives    Reaction: Childhood  . Cephalosporins Hives and Itching  . Other Hives  . Pioglitazone     Other reaction(s): Numbness  . Rosuvastatin     Other reaction(s): Myalgias  . Nitroglycerin Rash    PATCH Other reaction(s): Itch    HOME MEDICATIONS: Outpatient Medications Prior to Visit  Medication Sig Dispense Refill  . acetaminophen (TYLENOL) 500 MG tablet Take 1,000 mg by mouth every 6 (six) hours as needed for moderate pain or mild pain.    Marland Kitchen albuterol (VENTOLIN HFA) 108 (90 Base) MCG/ACT inhaler Inhale 1-2 puffs into the lungs every 6 (six) hours as needed for wheezing or shortness of breath.    Marland Kitchen aspirin EC 81 MG tablet Take 81 mg by mouth daily.    Marland Kitchen b complex vitamins capsule Take 1 capsule by mouth every Tuesday, Thursday, and Saturday at 6 PM.    . Betamethasone Dipropionate 0.05 % EMUL 2 drops 2 (two) times daily. Both ear    . carvedilol (COREG) 6.25 MG tablet Take 6.25 mg by mouth 2 (two) times daily with a meal.    . Cholecalciferol (VITAMIN D-3) 125 MCG (5000 UT) TABS Take 5,000 Units by mouth every Monday, Wednesday, and Friday.    . Continuous Blood Gluc Receiver (FREESTYLE LIBRE 14 DAY READER) DEVI Inject 1 application into the skin every 14 (fourteen) days.    . Continuous Blood Gluc Sensor (FREESTYLE LIBRE SENSOR SYSTEM) MISC by Does not apply route.    . diphenhydramine-acetaminophen (TYLENOL PM) 25-500 MG TABS tablet Take 1 tablet by mouth at bedtime as needed (Sleep).    . Dulaglutide 1.5 MG/0.5ML SOPN Inject 1.5 mg into the skin once a week. Wednesday    . Fexofenadine HCl (ALLEGRA PO) Take 180 mg by mouth daily  as needed (allergies). Takes for a month- alternates with Claritin each month.    . fluticasone (FLONASE) 50 MCG/ACT nasal spray Place 1 spray into both nostrils daily as needed for allergies or rhinitis.    Marland Kitchen ibuprofen (ADVIL) 600 MG tablet Take 600 mg by mouth daily as needed for moderate pain or mild pain.    Marland Kitchen lansoprazole (PREVACID) 30 MG capsule Take 30 mg by mouth every evening.    Marland Kitchen LORazepam (ATIVAN) 0.5 MG tablet Take 0.5 mg by mouth every 8 (eight) hours as needed for anxiety.    Marland Kitchen MINERAL OIL PO Place 1 drop into both ears once a week.    . Misc Natural Products (GLUCOSAMINE CHOND MSM FORMULA) TABS Take 1 tablet by mouth 2 (two) times daily.    Marland Kitchen  nystatin-triamcinolone ointment (MYCOLOG) Apply 1 application topically 2 (two) times daily. Both ear    . ondansetron (ZOFRAN) 4 MG tablet Take 4 mg by mouth every 6 (six) hours as needed for nausea.    Marland Kitchen. telmisartan-hydrochlorothiazide (MICARDIS HCT) 80-12.5 MG tablet Take 1 tablet by mouth daily.    Marland Kitchen. tiZANidine (ZANAFLEX) 2 MG tablet Take 2-4 mg by mouth 3 (three) times daily as needed for muscle spasms.    . traMADol (ULTRAM) 50 MG tablet Take 50-100 mg by mouth 3 (three) times daily as needed for pain.    . Turmeric 450 MG CAPS Take 450 mg by mouth daily.    . verapamil (VERELAN PM) 240 MG 24 hr capsule Take 240 mg by mouth at bedtime. CAN ONLY TAKE ACTAVIS BRAND YELLOW AND BLUE CAPSULE     No facility-administered medications prior to visit.    PAST MEDICAL HISTORY: Past Medical History:  Diagnosis Date  . Anxiety   . Asthma   . Bronchitis   . Carpal tunnel syndrome on both sides    left worse than right  . Degenerative arthritis of cervical spine   . Depressive disorder, not elsewhere classified   . Diabetes (HCC)   . Family history of heart disease    younger brother died from MI age 650  . GERD (gastroesophageal reflux disease)   . Headache    migraines  . History of asthma    no longer requires med., per pt.  .  History of kidney stones   . Hypercholesteremia   . Hyperlipidemia   . Hypertension    states under control with meds., has been on med. since 1990s  . Immature cataract of both eyes   . Memory difficulty   . Non-insulin dependent type 2 diabetes mellitus (HCC)   . PAT (paroxysmal atrial tachycardia) (HCC) 1970's   treated with Verapamil  . Pneumonia   . 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 Sickle cell trait (HCC)   . Stroke [facility](HCC)    exact date unknown  . Vitamin D deficiency     PAST SURGICAL HISTORY: Past Surgical History:  Procedure Laterality Date  . BREAST LUMPECTOMY WITH RADIOACTIVE SEED LOCALIZATION Left 01/30/2018   Procedure: LEFT BREAST BRACKETED LUMPECTOMY WITH RADIOACTIVE SEED LOCALIZATION;  Surgeon: Abigail MiyamotoBlackman, Douglas, MD;  Location: [facility];  Service: General;  Laterality: Left;  . BREAST SURGERY Left 2009   calcification to left breast  . CESAREAN SECTION  1982  . DILATION AND CURETTAGE OF UTERUS    . ENDOMETRIAL ABLATION  1991  . TUBAL LIGATION  1982    FAMILY HISTORY: Family History  Problem Relation Age of Onset  . Diabetes Mother        DM  . Hypertension Father   . Diabetes Father        DM  . Heart attack Brother   . CAD Brother   . Heart disease Paternal Grandfather   . Diabetes Brother        DM  . Heart attack Brother   . CAD Brother   . Lupus Other        Family H/O of Lupus  . Breast cancer Cousin        unsure of age  . Dementia Neg Hx   . Alzheimer's disease Neg Hx     SOCIAL HISTORY: Social History   Socioeconomic History  . Marital status: Widowed    Spouse name: Not on file  . Number  of children: 2  . Years of education: 34  . Highest education level: Bachelor's degree (e.g., BA, AB, BS)  Occupational History  . Occupation: Part-Time at [employer]  . Smoking status: Never Smoker  . Smokeless tobacco: Never Used  Vaping Use  . Vaping Use: Never used  Substance and Sexual Activity  .  Alcohol use: No  . Drug use: No  . Sexual activity: Not Currently  Other Topics Concern  . Not on file  Social History Narrative   Retired recently as a Engineer, civil (consulting)   Right handed   Caffeine: decaf coffee occasionally    Lives at home alone      Patient has 2 biological adult children, Darnell and Cornelia, and 5 grandchildren    Social Determinants of Health   Financial Resource Strain: Low Risk   . Difficulty of Paying Living Expenses: Not very hard  Food Insecurity: No Food Insecurity  . Worried About Programme researcher, broadcasting/film/video in the Last Year: Never true  . Ran Out of Food in the Last Year: Never true  Transportation Needs: No Transportation Needs  . Lack of Transportation (Medical): No  . Lack of Transportation (Non-Medical): No  Physical Activity: Inactive  . Days of Exercise per Week: 0 days  . Minutes of Exercise per Session: 0 min  Stress: No Stress Concern Present  . Feeling of Stress : Only a little  Social Connections: Moderately Isolated  . Frequency of Communication with Friends and Family: More than three times a week  . Frequency of Social Gatherings with Friends and Family: More than three times a week  . Attends Religious Services: More than 4 times per year  . Active Member of Clubs or Organizations: No  . Attends Banker Meetings: Never  . Marital Status: Widowed  Intimate Partner Violence: Not At Risk  . Fear of Current or Ex-Partner: No  . Emotionally Abused: No  . Physically Abused: No  . Sexually Abused: No      PHYSICAL EXAM  Vitals:   05/18/21 1307  BP: 116/69  Pulse: 90  Weight: 155 lb (70.3 kg)  Height: 5' (1.524 m)   Body mass index is 30.27 kg/m.   MMSE - Mini Mental State Exam 05/18/2021 11/16/2020  Orientation to time 5 4  Orientation to Place 5 4  Registration 3 3  Attention/ Calculation 0 0  Recall 3 2  Language- name 2 objects 2 2  Language- repeat 1 0  Language- follow 3 step command 3 3  Language- read & follow  direction 1 1  Write a sentence 1 1  Copy design 0 0  Total score 24 20     Generalized: Well developed, in no acute distress   Neurological examination  Mentation: Alert oriented to time, place, history taking. Follows all commands speech and language fluent Cranial nerve II-XII: Pupils were equal round reactive to light. Extraocular movements were full, visual field were full on confrontational test. Facial sensation and strength were normal. Uvula tongue midline. Head turning and shoulder shrug  were normal and symmetric. Motor: The motor testing reveals 5 over 5 strength of all 4 extremities. Good symmetric motor tone is noted throughout.  Sensory: Sensory testing is intact to soft touch on all 4 extremities. No evidence of extinction is noted.  Coordination: Cerebellar testing reveals good finger-nose-finger and heel-to-shin bilaterally.  Gait and station: Gait is normal. Tandem gait is normal. Romberg is negative. No drift is seen.  Reflexes:  Deep tendon reflexes are symmetric and normal bilaterally.   DIAGNOSTIC DATA (LABS, IMAGING, TESTING) - I reviewed patient records, labs, notes, testing and imaging myself where available.  Lab Results  Component Value Date   WBC 7.8 04/18/2021   HGB 14.3 04/18/2021   HCT 42.7 04/18/2021   MCV 87.9 04/18/2021   PLT 303 04/18/2021      Component Value Date/Time   NA 138 04/18/2021 0950   NA 143 11/16/2020 0842   K 3.6 04/18/2021 0950   CL 101 04/18/2021 0950   CO2 29 04/18/2021 0950   GLUCOSE 180 (H) 04/18/2021 0950   BUN 10 04/18/2021 0950   BUN 8 11/16/2020 0842   CREATININE 0.70 04/18/2021 0950   CREATININE 0.80 01/17/2015 1146   CALCIUM 9.7 04/18/2021 0950   GFRNONAA >60 04/18/2021 0950   GFRAA 95 11/16/2020 0842   No results found for: CHOL, HDL, LDLCALC, LDLDIRECT, TRIG, CHOLHDL Lab Results  Component Value Date   HGBA1C 7.2 (H) 04/18/2021   Lab Results  Component Value Date   VITAMINB12 172 (L) 11/16/2020   No  results found for: TSH    ASSESSMENT AND PLAN 70 y.o. year old female  has a past medical history of Anxiety, Asthma, Bronchitis, Carpal tunnel syndrome on both sides, Degenerative arthritis of cervical spine, Depressive disorder, not elsewhere classified, Diabetes (HCC), Family history of heart disease, GERD (gastroesophageal reflux disease), Headache, History of asthma, History of kidney stones, Hypercholesteremia, Hyperlipidemia, Hypertension, Immature cataract of both eyes, Memory difficulty, Non-insulin dependent type 2 diabetes mellitus (HCC), PAT (paroxysmal atrial tachycardia) (HCC) (1970's), Pneumonia, Sclerosing adenosis of breast, left (12/2017), Sickle cell trait (HCC), Stroke (HCC), and Vitamin D deficiency. here with :  1.  Memory disturbance  --We will continue to monitor symptoms -- MMSE 24 out of 30 previously 20 out of 30 -- We will consider neuropsychological testing if memory worsens -- Follow-up in 6 months or sooner if needed   I spent 20 minutes of face-to-face and non-face-to-face time with patient.  This included previsit chart review, lab review, study review, order entry, electronic health record documentation, patient education.  Butch Penny, MSN, NP-C 05/18/2021, 1:14 PM [facility] [address] [place], [place] [address] [phone]   agree with assessment and plan as stated.     Naomie Dean, MD [facility]

## 2021-05-31 NOTE — Telephone Encounter (Signed)
Note updated

## 2021-06-30 DIAGNOSIS — H60313 Diffuse otitis externa, bilateral: Secondary | ICD-10-CM | POA: Diagnosis not present

## 2021-06-30 DIAGNOSIS — H93A3 Pulsatile tinnitus, bilateral: Secondary | ICD-10-CM | POA: Diagnosis not present

## 2021-06-30 DIAGNOSIS — H903 Sensorineural hearing loss, bilateral: Secondary | ICD-10-CM | POA: Diagnosis not present

## 2021-06-30 DIAGNOSIS — H9041 Sensorineural hearing loss, unilateral, right ear, with unrestricted hearing on the contralateral side: Secondary | ICD-10-CM | POA: Diagnosis not present

## 2021-07-03 DIAGNOSIS — M19041 Primary osteoarthritis, right hand: Secondary | ICD-10-CM | POA: Diagnosis not present

## 2021-07-03 DIAGNOSIS — J4520 Mild intermittent asthma, uncomplicated: Secondary | ICD-10-CM | POA: Diagnosis not present

## 2021-07-03 DIAGNOSIS — M47816 Spondylosis without myelopathy or radiculopathy, lumbar region: Secondary | ICD-10-CM | POA: Diagnosis not present

## 2021-07-03 DIAGNOSIS — I10 Essential (primary) hypertension: Secondary | ICD-10-CM | POA: Diagnosis not present

## 2021-07-03 DIAGNOSIS — M47812 Spondylosis without myelopathy or radiculopathy, cervical region: Secondary | ICD-10-CM | POA: Diagnosis not present

## 2021-07-03 DIAGNOSIS — E7800 Pure hypercholesterolemia, unspecified: Secondary | ICD-10-CM | POA: Diagnosis not present

## 2021-07-03 DIAGNOSIS — E1169 Type 2 diabetes mellitus with other specified complication: Secondary | ICD-10-CM | POA: Diagnosis not present

## 2021-07-03 DIAGNOSIS — K219 Gastro-esophageal reflux disease without esophagitis: Secondary | ICD-10-CM | POA: Diagnosis not present

## 2021-07-03 DIAGNOSIS — F4321 Adjustment disorder with depressed mood: Secondary | ICD-10-CM | POA: Diagnosis not present

## 2021-07-06 DIAGNOSIS — H93A3 Pulsatile tinnitus, bilateral: Secondary | ICD-10-CM | POA: Diagnosis not present

## 2021-07-06 DIAGNOSIS — I10 Essential (primary) hypertension: Secondary | ICD-10-CM | POA: Diagnosis not present

## 2021-07-11 DIAGNOSIS — E559 Vitamin D deficiency, unspecified: Secondary | ICD-10-CM | POA: Diagnosis not present

## 2021-07-11 DIAGNOSIS — G720 Drug-induced myopathy: Secondary | ICD-10-CM | POA: Diagnosis not present

## 2021-07-11 DIAGNOSIS — J4520 Mild intermittent asthma, uncomplicated: Secondary | ICD-10-CM | POA: Diagnosis not present

## 2021-07-11 DIAGNOSIS — I10 Essential (primary) hypertension: Secondary | ICD-10-CM | POA: Diagnosis not present

## 2021-07-11 DIAGNOSIS — E7800 Pure hypercholesterolemia, unspecified: Secondary | ICD-10-CM | POA: Diagnosis not present

## 2021-07-11 DIAGNOSIS — F4321 Adjustment disorder with depressed mood: Secondary | ICD-10-CM | POA: Diagnosis not present

## 2021-07-11 DIAGNOSIS — Z0000 Encounter for general adult medical examination without abnormal findings: Secondary | ICD-10-CM | POA: Diagnosis not present

## 2021-07-11 DIAGNOSIS — M47812 Spondylosis without myelopathy or radiculopathy, cervical region: Secondary | ICD-10-CM | POA: Diagnosis not present

## 2021-07-11 DIAGNOSIS — K219 Gastro-esophageal reflux disease without esophagitis: Secondary | ICD-10-CM | POA: Diagnosis not present

## 2021-07-13 DIAGNOSIS — H25813 Combined forms of age-related cataract, bilateral: Secondary | ICD-10-CM | POA: Diagnosis not present

## 2021-07-13 DIAGNOSIS — H35033 Hypertensive retinopathy, bilateral: Secondary | ICD-10-CM | POA: Diagnosis not present

## 2021-07-13 DIAGNOSIS — E119 Type 2 diabetes mellitus without complications: Secondary | ICD-10-CM | POA: Diagnosis not present

## 2021-07-25 DIAGNOSIS — Z20822 Contact with and (suspected) exposure to covid-19: Secondary | ICD-10-CM | POA: Diagnosis not present

## 2021-07-25 DIAGNOSIS — U071 COVID-19: Secondary | ICD-10-CM | POA: Diagnosis not present

## 2021-07-28 DIAGNOSIS — H9041 Sensorineural hearing loss, unilateral, right ear, with unrestricted hearing on the contralateral side: Secondary | ICD-10-CM | POA: Diagnosis not present

## 2021-08-04 DIAGNOSIS — J4520 Mild intermittent asthma, uncomplicated: Secondary | ICD-10-CM | POA: Diagnosis not present

## 2021-08-04 DIAGNOSIS — I10 Essential (primary) hypertension: Secondary | ICD-10-CM | POA: Diagnosis not present

## 2021-08-04 DIAGNOSIS — M47816 Spondylosis without myelopathy or radiculopathy, lumbar region: Secondary | ICD-10-CM | POA: Diagnosis not present

## 2021-08-04 DIAGNOSIS — M19041 Primary osteoarthritis, right hand: Secondary | ICD-10-CM | POA: Diagnosis not present

## 2021-08-04 DIAGNOSIS — E1169 Type 2 diabetes mellitus with other specified complication: Secondary | ICD-10-CM | POA: Diagnosis not present

## 2021-08-04 DIAGNOSIS — K219 Gastro-esophageal reflux disease without esophagitis: Secondary | ICD-10-CM | POA: Diagnosis not present

## 2021-08-04 DIAGNOSIS — E7800 Pure hypercholesterolemia, unspecified: Secondary | ICD-10-CM | POA: Diagnosis not present

## 2021-08-04 DIAGNOSIS — M47812 Spondylosis without myelopathy or radiculopathy, cervical region: Secondary | ICD-10-CM | POA: Diagnosis not present

## 2021-08-04 DIAGNOSIS — F4321 Adjustment disorder with depressed mood: Secondary | ICD-10-CM | POA: Diagnosis not present

## 2021-08-08 DIAGNOSIS — I10 Essential (primary) hypertension: Secondary | ICD-10-CM | POA: Diagnosis not present

## 2021-08-08 DIAGNOSIS — Z8673 Personal history of transient ischemic attack (TIA), and cerebral infarction without residual deficits: Secondary | ICD-10-CM | POA: Diagnosis not present

## 2021-08-08 DIAGNOSIS — M4807 Spinal stenosis, lumbosacral region: Secondary | ICD-10-CM | POA: Diagnosis not present

## 2021-08-08 DIAGNOSIS — E785 Hyperlipidemia, unspecified: Secondary | ICD-10-CM | POA: Diagnosis not present

## 2021-08-08 DIAGNOSIS — Z6829 Body mass index (BMI) 29.0-29.9, adult: Secondary | ICD-10-CM | POA: Diagnosis not present

## 2021-08-08 DIAGNOSIS — E1169 Type 2 diabetes mellitus with other specified complication: Secondary | ICD-10-CM | POA: Diagnosis not present

## 2021-08-30 DIAGNOSIS — M19041 Primary osteoarthritis, right hand: Secondary | ICD-10-CM | POA: Diagnosis not present

## 2021-08-30 DIAGNOSIS — J4520 Mild intermittent asthma, uncomplicated: Secondary | ICD-10-CM | POA: Diagnosis not present

## 2021-08-30 DIAGNOSIS — I10 Essential (primary) hypertension: Secondary | ICD-10-CM | POA: Diagnosis not present

## 2021-08-30 DIAGNOSIS — F4321 Adjustment disorder with depressed mood: Secondary | ICD-10-CM | POA: Diagnosis not present

## 2021-08-30 DIAGNOSIS — E7800 Pure hypercholesterolemia, unspecified: Secondary | ICD-10-CM | POA: Diagnosis not present

## 2021-08-30 DIAGNOSIS — M47816 Spondylosis without myelopathy or radiculopathy, lumbar region: Secondary | ICD-10-CM | POA: Diagnosis not present

## 2021-08-30 DIAGNOSIS — M47812 Spondylosis without myelopathy or radiculopathy, cervical region: Secondary | ICD-10-CM | POA: Diagnosis not present

## 2021-08-30 DIAGNOSIS — E1169 Type 2 diabetes mellitus with other specified complication: Secondary | ICD-10-CM | POA: Diagnosis not present

## 2021-08-30 DIAGNOSIS — K219 Gastro-esophageal reflux disease without esophagitis: Secondary | ICD-10-CM | POA: Diagnosis not present

## 2021-09-27 DIAGNOSIS — K5900 Constipation, unspecified: Secondary | ICD-10-CM | POA: Diagnosis not present

## 2021-09-27 DIAGNOSIS — K219 Gastro-esophageal reflux disease without esophagitis: Secondary | ICD-10-CM | POA: Diagnosis not present

## 2021-10-13 DIAGNOSIS — M47816 Spondylosis without myelopathy or radiculopathy, lumbar region: Secondary | ICD-10-CM | POA: Diagnosis not present

## 2021-10-13 DIAGNOSIS — F4321 Adjustment disorder with depressed mood: Secondary | ICD-10-CM | POA: Diagnosis not present

## 2021-10-13 DIAGNOSIS — J4520 Mild intermittent asthma, uncomplicated: Secondary | ICD-10-CM | POA: Diagnosis not present

## 2021-10-13 DIAGNOSIS — M19041 Primary osteoarthritis, right hand: Secondary | ICD-10-CM | POA: Diagnosis not present

## 2021-10-13 DIAGNOSIS — E1169 Type 2 diabetes mellitus with other specified complication: Secondary | ICD-10-CM | POA: Diagnosis not present

## 2021-10-13 DIAGNOSIS — I10 Essential (primary) hypertension: Secondary | ICD-10-CM | POA: Diagnosis not present

## 2021-10-13 DIAGNOSIS — E7800 Pure hypercholesterolemia, unspecified: Secondary | ICD-10-CM | POA: Diagnosis not present

## 2021-10-13 DIAGNOSIS — K219 Gastro-esophageal reflux disease without esophagitis: Secondary | ICD-10-CM | POA: Diagnosis not present

## 2021-10-19 DIAGNOSIS — E119 Type 2 diabetes mellitus without complications: Secondary | ICD-10-CM | POA: Diagnosis not present

## 2021-10-19 DIAGNOSIS — H53453 Other localized visual field defect, bilateral: Secondary | ICD-10-CM | POA: Diagnosis not present

## 2021-10-19 DIAGNOSIS — H35033 Hypertensive retinopathy, bilateral: Secondary | ICD-10-CM | POA: Diagnosis not present

## 2021-10-19 DIAGNOSIS — H25813 Combined forms of age-related cataract, bilateral: Secondary | ICD-10-CM | POA: Diagnosis not present

## 2021-10-19 DIAGNOSIS — G458 Other transient cerebral ischemic attacks and related syndromes: Secondary | ICD-10-CM | POA: Diagnosis not present

## 2021-10-21 DIAGNOSIS — Z23 Encounter for immunization: Secondary | ICD-10-CM | POA: Diagnosis not present

## 2021-11-15 DIAGNOSIS — E785 Hyperlipidemia, unspecified: Secondary | ICD-10-CM | POA: Diagnosis not present

## 2021-11-15 DIAGNOSIS — I10 Essential (primary) hypertension: Secondary | ICD-10-CM | POA: Diagnosis not present

## 2021-11-15 DIAGNOSIS — Z6831 Body mass index (BMI) 31.0-31.9, adult: Secondary | ICD-10-CM | POA: Diagnosis not present

## 2021-11-15 DIAGNOSIS — E1169 Type 2 diabetes mellitus with other specified complication: Secondary | ICD-10-CM | POA: Diagnosis not present

## 2021-11-15 DIAGNOSIS — M4807 Spinal stenosis, lumbosacral region: Secondary | ICD-10-CM | POA: Diagnosis not present

## 2021-11-15 DIAGNOSIS — Z8673 Personal history of transient ischemic attack (TIA), and cerebral infarction without residual deficits: Secondary | ICD-10-CM | POA: Diagnosis not present

## 2021-11-21 ENCOUNTER — Encounter: Payer: Self-pay | Admitting: Adult Health

## 2021-11-21 ENCOUNTER — Ambulatory Visit: Payer: HMO | Admitting: Adult Health

## 2021-11-21 ENCOUNTER — Other Ambulatory Visit: Payer: Self-pay

## 2021-11-21 VITALS — BP 157/78 | HR 74 | Ht 60.0 in | Wt 159.6 lb

## 2021-11-21 DIAGNOSIS — R413 Other amnesia: Secondary | ICD-10-CM | POA: Diagnosis not present

## 2021-11-21 DIAGNOSIS — E7800 Pure hypercholesterolemia, unspecified: Secondary | ICD-10-CM | POA: Diagnosis not present

## 2021-11-21 DIAGNOSIS — M47812 Spondylosis without myelopathy or radiculopathy, cervical region: Secondary | ICD-10-CM | POA: Diagnosis not present

## 2021-11-21 DIAGNOSIS — M19041 Primary osteoarthritis, right hand: Secondary | ICD-10-CM | POA: Diagnosis not present

## 2021-11-21 DIAGNOSIS — J4520 Mild intermittent asthma, uncomplicated: Secondary | ICD-10-CM | POA: Diagnosis not present

## 2021-11-21 DIAGNOSIS — E1169 Type 2 diabetes mellitus with other specified complication: Secondary | ICD-10-CM | POA: Diagnosis not present

## 2021-11-21 DIAGNOSIS — K219 Gastro-esophageal reflux disease without esophagitis: Secondary | ICD-10-CM | POA: Diagnosis not present

## 2021-11-21 DIAGNOSIS — F4321 Adjustment disorder with depressed mood: Secondary | ICD-10-CM | POA: Diagnosis not present

## 2021-11-21 DIAGNOSIS — I10 Essential (primary) hypertension: Secondary | ICD-10-CM | POA: Diagnosis not present

## 2021-11-21 NOTE — Progress Notes (Signed)
PATIENT: Jennifer [name] DOB: [birth year]  REASON FOR VISIT: follow up HISTORY FROM: patient  HISTORY OF PRESENT ILLNESS: Today 11/21/21:  Jennifer [name] is a 70 year old female with a history of memory disturbance. She returns today for follow-up. Returns today for follow-up.  She continues to live at home alone.  She is able to complete all ADLs independently.  Manages her own medications, finances and appointments.  She states that she recently started back working at [employer] 8 hours a week.  She denies any new symptoms.  Reports that she did start a B complex vitamin and feels that has been beneficial for her memory . she returns today for an evaluation.  05/18/21: Jennifer [name] is a 70 year old female with a history of memory disturbance.  She returns today for follow-up.  She feels that her memory has improved.  She lives at home alone.  She is able to complete all ADLs independently.  She reports that she has some physical limitations with her housework.  Manages her finances.  She manages her appointments and medications.  She had an MRI that was relatively unremarkable with exception of an old infarct.  Sleep study was unremarkable.  Patient returns today for evaluation.  HISTORY 12/27/20: Jennifer [name] is a 70 y.o. year old Black or [place] American female patient seen here as a referral on 12/27/2020 from Dr. Lucia Gaskins.    Chief concern according to patient : "My memory changed when I lost my husband, March 2021, and there was drama with is children for 2-3 years before."       I have the pleasure of seeing Jennifer [name] today, a right -handed Black or [place] American female with a possible sleep disorder.    Jennifer [name] is a 70 y.o. female here as requested by Daisy Floro, MD for memory changes. PMHx HTN, DM2, HLD, osteoarthritis, back pain, kidney stones, drug-induced myopathy and myalgia, carpal tunnel syndrome, memory changes, grief reaction. I reviewed Dr. Charlott Rakes  notes: Patient feels tired, poor sleep, stressed, now on prednisone and her A1c is up to 7.28, no recent lorazepam use, she is worried about her memory she is gotten lost a few times and she is very tired a lot, B12 and thyroid were checked, vitamin D, lipid panel were also checked, vitamin D was normal 62.6, CMP showed creatinine 0.62 and BUN 11 otherwise unremarkable labs were drawn 2020-09-07, B12 was low at 219 we will recheck. TSH normal.    Patient reports ongoing for 2 years, worsening, patient always has to use her GPS, it was worse several months ago. She is leaving things in places. She has a lot of stress the last few years, forgetting things, simple things, getting worse, she needs a nap every day, husband died this year and there is grief, even cooking she is forgetting, to prepare a big meal is difficult when she cooks for people, she has to write things down, ongoing 2 years, no hx of dementi ain the family that she knows of, her bills are paid, she has to use her GPS, she has to think where she is but not getting lost. No weakness, no sensory changes, husband had dementia and she was his caregiver. No other focal neurologic deficits, associated symptoms, inciting events or modifiable factors.     Sleep relevant medical history: see above. 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    Family medical /sleep history: no other family member on CPAP with  [facility].    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 She worked mostly night shifts.  She lives in a household alone, widowed. No biological children, Pets are not present. Tobacco use: none  ETOH use ; none,  Caffeine intake in form of Coffee( /) Soda( /) Tea ( /) - less than a cup in 3 days.  No regular exercise .           Sleep habits are as follows: The patient's lunch time is between 12-2  PM, and last meal is 5-6 PM.  The patient goes to bed at 6 or at 10 PM, she is actively  trying to change that.   and continues to sleep for several hours, wakes for 1-2 bathroom breaks, the first time at 3 AM.     The preferred sleep position is on her left side , with the support of 1 pillow.  Bedroom is cool, quiet and dark.  Dreams are reportedly rare. 5.30 AM is the usual rise time.  The patient wakes up spontaneously. She reports not feeling refreshed or restored in AM, with symptoms such as dry mouth , but no morning headaches , and residual fatigue.  Naps are taken routinely, lasting from 2-3 Pm and is much more refreshing than nocturnal sleep.     REVIEW OF SYSTEMS: Out of a complete 14 system review of symptoms, the patient complains only of the following symptoms, and all other reviewed systems are negative.  ALLERGIES: Allergies  Allergen Reactions   Accupril [Quinapril Hcl] Other (See Comments) and Cough    NUMBNESS AND TINGLING   Penicillins Hives    Reaction: Childhood   Cephalosporins Hives and Itching   Other Hives   Pioglitazone     Other reaction(s): Numbness   Rosuvastatin     Other reaction(s): Myalgias   Nitroglycerin Rash    PATCH Other reaction(s): Itch    HOME MEDICATIONS: Outpatient Medications Prior to Visit  Medication Sig Dispense Refill   acetaminophen (TYLENOL) 500 MG tablet Take 1,000 mg by mouth every 6 (six) hours as needed for moderate pain or mild pain.     albuterol (VENTOLIN HFA) 108 (90 Base) MCG/ACT inhaler Inhale 1-2 puffs into the lungs every 6 (six) hours as needed for wheezing or shortness of breath.     aspirin EC 81 MG tablet Take 81 mg by mouth daily.     b complex vitamins capsule Take 1 capsule by mouth every Tuesday, Thursday, and Saturday at 6 PM.     Betamethasone Dipropionate 0.05 % EMUL 2 drops 2 (two) times daily. Both ear     carvedilol (COREG) 6.25 MG tablet Take 6.25 mg by mouth 2 (two) times daily with a meal.     Cholecalciferol (VITAMIN D-3) 125 MCG (5000 UT) TABS Take 5,000 Units by mouth every Monday,  Wednesday, and Friday.     Continuous Blood Gluc Receiver (FREESTYLE LIBRE 14 DAY READER) DEVI Inject 1 application into the skin every 14 (fourteen) days.     Continuous Blood Gluc Sensor (Icard) MISC by Does not apply route.     diphenhydramine-acetaminophen (TYLENOL PM) 25-500 MG TABS tablet Take 1 tablet by mouth at bedtime as needed (Sleep).     Dulaglutide 1.5 MG/0.5ML SOPN Inject 1.5 mg into the skin once a week. Wednesday     Fexofenadine HCl (ALLEGRA PO) Take 180 mg by mouth daily as needed (allergies). Takes for a month-  alternates with Claritin each month.     fluticasone (FLONASE) 50 MCG/ACT nasal spray Place 1 spray into both nostrils daily as needed for allergies or rhinitis.     ibuprofen (ADVIL) 600 MG tablet Take 600 mg by mouth daily as needed for moderate pain or mild pain.     lansoprazole (PREVACID) 30 MG capsule Take 30 mg by mouth every evening.     LORazepam (ATIVAN) 0.5 MG tablet Take 0.5 mg by mouth every 8 (eight) hours as needed for anxiety.     MINERAL OIL PO Place 1 drop into both ears once a week.     Misc Natural Products (GLUCOSAMINE CHOND MSM FORMULA) TABS Take 1 tablet by mouth 2 (two) times daily.     nystatin-triamcinolone ointment (MYCOLOG) Apply 1 application topically 2 (two) times daily. Both ear     ondansetron (ZOFRAN) 4 MG tablet Take 4 mg by mouth every 6 (six) hours as needed for nausea.     telmisartan-hydrochlorothiazide (MICARDIS HCT) 80-12.5 MG tablet Take 1 tablet by mouth daily.     tiZANidine (ZANAFLEX) 2 MG tablet Take 2-4 mg by mouth 3 (three) times daily as needed for muscle spasms.     traMADol (ULTRAM) 50 MG tablet Take 50-100 mg by mouth 3 (three) times daily as needed for pain.     Turmeric 450 MG CAPS Take 450 mg by mouth daily.     verapamil (VERELAN PM) 240 MG 24 hr capsule Take 240 mg by mouth at bedtime. CAN ONLY TAKE ACTAVIS BRAND YELLOW AND BLUE CAPSULE     No facility-administered medications prior to  visit.    PAST MEDICAL HISTORY: Past Medical History:  Diagnosis Date   Anxiety    Asthma    Bronchitis    Carpal tunnel syndrome on both sides    left worse than right   Degenerative arthritis of cervical spine    Depressive disorder, not elsewhere classified    Diabetes ([place])    Family history of heart disease    younger brother died from MI age 57   GERD (gastroesophageal reflux disease)    Headache    migraines   History of asthma    no longer requires med., per pt.   History of kidney stones    Hypercholesteremia    Hyperlipidemia    Hypertension    states under control with meds., has been on med. 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   Sickle cell trait ([place])    Stroke [facility])    exact date unknown   Vitamin D deficiency     PAST SURGICAL HISTORY: Past Surgical History:  Procedure Laterality Date   BREAST LUMPECTOMY WITH RADIOACTIVE SEED LOCALIZATION Left 01/30/2018   Procedure: LEFT BREAST BRACKETED LUMPECTOMY WITH RADIOACTIVE SEED LOCALIZATION;  Surgeon: Coralie Keens, MD;  Location: [facility];  Service: General;  Laterality: Left;   BREAST SURGERY Left 2009   calcification to left breast   CESAREAN SECTION  1982   DILATION AND CURETTAGE OF UTERUS     ENDOMETRIAL ABLATION  1991   TUBAL LIGATION  1982    FAMILY HISTORY: Family History  Problem Relation Age of Onset   Diabetes Mother  DM   Hypertension Father    Diabetes Father        DM   Heart attack Brother    CAD Brother    Heart disease Paternal Grandfather    Diabetes Brother        DM   Heart attack Brother    CAD Brother    Lupus Other        Family H/O of Lupus   Breast cancer Cousin        unsure of age   Dementia Neg Hx    Alzheimer's  disease Neg Hx     SOCIAL HISTORY: Social History   Socioeconomic History   Marital status: Widowed    Spouse name: Not on file   Number of children: 2   Years of education: 12   Highest education level: Bachelor's degree (e.g., BA, AB, BS)  Occupational History   Occupation: Part-Time at [employer]  Tobacco Use   Smoking status: Never   Smokeless tobacco: Never  Vaping Use   Vaping Use: Never used  Substance and Sexual Activity   Alcohol use: No   Drug use: No   Sexual activity: Not Currently  Other Topics Concern   Not on file  Social History Narrative   Retired recently as a Marine scientist   Right handed   Caffeine: decaf coffee occasionally    Lives at home alone      Patient has 2 biological adult children, Chief Operating Officer and Cornelia, and 5 grandchildren    Social Determinants of Health   Financial Resource Strain: Not on file  Food Insecurity: Not on file  Transportation Needs: Not on file  Physical Activity: Not on file  Stress: Not on file  Social Connections: Not on file  Intimate Partner Violence: Not on file      PHYSICAL EXAM  Vitals:   11/21/21 1504  BP: (!) 157/78  Pulse: 74  Weight: 159 lb 9.6 oz (72.4 kg)  Height: 5' (1.524 m)    Body mass index is 31.17 kg/m.   MMSE - Mini Mental State Exam 11/21/2021 05/18/2021 11/16/2020  Orientation to time 5 5 4   Orientation to Place 5 5 4   Registration 3 3 3   Attention/ Calculation 1 0 0  Recall 3 3 2   Language- name 2 objects 2 2 2   Language- repeat 1 1 0  Language- follow 3 step command 3 3 3   Language- read & follow direction 1 1 1   Write a sentence 1 1 1   Copy design 1 0 0  Total score 26 24 20      Generalized: Well developed, in no acute distress   Neurological examination  Mentation: Alert oriented to time, place, history taking. Follows all commands speech and language fluent Cranial nerve II-XII: Pupils were equal round reactive to light. Extraocular movements were full, visual field were full  on confrontational test. Facial sensation and strength were normal. Uvula tongue midline. Head turning and shoulder shrug  were normal and symmetric. Motor: The motor testing reveals 5 over 5 strength of all 4 extremities. Good symmetric motor tone is noted throughout.  Sensory: Sensory testing is intact to soft touch on all 4 extremities. No evidence of extinction is noted.  Coordination: Cerebellar testing reveals good finger-nose-finger and heel-to-shin bilaterally.  Gait and station: Gait is normal. Tandem gait is normal. Romberg is negative. No drift is seen.  Reflexes: Deep tendon reflexes are symmetric and normal bilaterally.   DIAGNOSTIC DATA (LABS, IMAGING, TESTING) - I reviewed  patient records, labs, notes, testing and imaging myself where available.  Lab Results  Component Value Date   WBC 7.8 04/18/2021   HGB 14.3 04/18/2021   HCT 42.7 04/18/2021   MCV 87.9 04/18/2021   PLT 303 04/18/2021      Component Value Date/Time   NA 138 04/18/2021 0950   NA 143 11/16/2020 0842   K 3.6 04/18/2021 0950   CL 101 04/18/2021 0950   CO2 29 04/18/2021 0950   GLUCOSE 180 (H) 04/18/2021 0950   BUN 10 04/18/2021 0950   BUN 8 11/16/2020 0842   CREATININE 0.70 04/18/2021 0950   CREATININE 0.80 01/17/2015 1146   CALCIUM 9.7 04/18/2021 0950   GFRNONAA >60 04/18/2021 0950   GFRAA 95 11/16/2020 0842      ASSESSMENT AND PLAN 70 y.o. year old female  has a past medical history of Anxiety, Asthma, Bronchitis, Carpal tunnel syndrome on both sides, Degenerative arthritis of cervical spine, Depressive disorder, not elsewhere classified, Diabetes ([place]), Family history of heart disease, GERD (gastroesophageal reflux disease), Headache, History of asthma, History of kidney stones, Hypercholesteremia, Hyperlipidemia, Hypertension, Immature cataract of both eyes, Memory difficulty, Non-insulin dependent type 2 diabetes mellitus ([place]), PAT (paroxysmal atrial tachycardia) ([place]) (1970's), Pneumonia,  Sclerosing adenosis of breast, left (12/2017), Sickle cell trait ([place]), Stroke ([place]), and Vitamin D deficiency. here with :  1.  Memory disturbance  --We will continue to monitor symptoms -- MMSE 26 out of 30 previously 24 out of 30 -- Follow-up in 1 year or sooner if needed   Ward Givens, MSN, NP-C 11/21/2021, 2:45 PM [facility] [address] [place], [place] [address] [phone]

## 2021-12-20 ENCOUNTER — Other Ambulatory Visit: Payer: Self-pay | Admitting: Family Medicine

## 2021-12-20 DIAGNOSIS — Z1231 Encounter for screening mammogram for malignant neoplasm of breast: Secondary | ICD-10-CM

## 2022-01-15 DIAGNOSIS — J4520 Mild intermittent asthma, uncomplicated: Secondary | ICD-10-CM | POA: Diagnosis not present

## 2022-01-15 DIAGNOSIS — F4321 Adjustment disorder with depressed mood: Secondary | ICD-10-CM | POA: Diagnosis not present

## 2022-01-15 DIAGNOSIS — E1169 Type 2 diabetes mellitus with other specified complication: Secondary | ICD-10-CM | POA: Diagnosis not present

## 2022-01-15 DIAGNOSIS — M179 Osteoarthritis of knee, unspecified: Secondary | ICD-10-CM | POA: Diagnosis not present

## 2022-01-22 DIAGNOSIS — M25462 Effusion, left knee: Secondary | ICD-10-CM | POA: Diagnosis not present

## 2022-01-22 DIAGNOSIS — M1711 Unilateral primary osteoarthritis, right knee: Secondary | ICD-10-CM | POA: Diagnosis not present

## 2022-01-22 DIAGNOSIS — M1712 Unilateral primary osteoarthritis, left knee: Secondary | ICD-10-CM | POA: Diagnosis not present

## 2022-01-24 ENCOUNTER — Ambulatory Visit
Admission: RE | Admit: 2022-01-24 | Discharge: 2022-01-24 | Disposition: A | Discharge status: Home / Self Care | Payer: HMO | Source: Ambulatory Visit | Attending: Family Medicine | Admitting: Family Medicine

## 2022-01-24 DIAGNOSIS — Z1231 Encounter for screening mammogram for malignant neoplasm of breast: Secondary | ICD-10-CM

## 2022-01-24 IMAGING — MG MM DIGITAL SCREENING BILAT W/ TOMO AND CAD
8 series · 8 of 24 positions shown · non-contrast
Comparison: Previous exam(s).

CLINICAL DATA: Screening.

EXAM:
DIGITAL SCREENING BILATERAL MAMMOGRAM WITH TOMOSYNTHESIS AND CAD
TECHNIQUE: Bilateral screening digital craniocaudal and mediolateral oblique
mammograms were obtained. Bilateral screening digital breast
tomosynthesis was performed. The images were evaluated with
computer-aided detection.

[L MLO synth-2D]
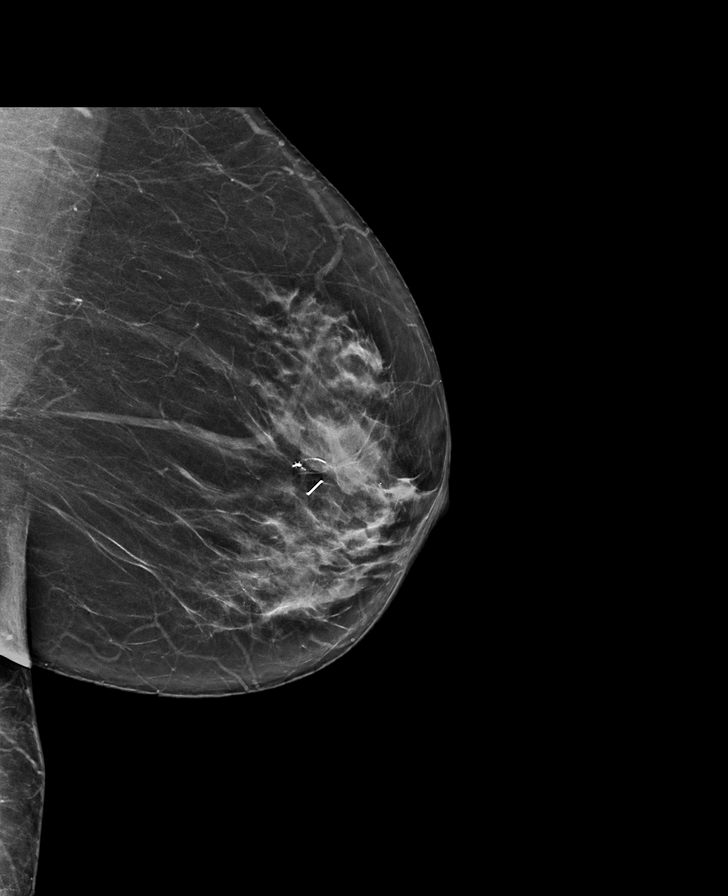

[L CC synth-2D]
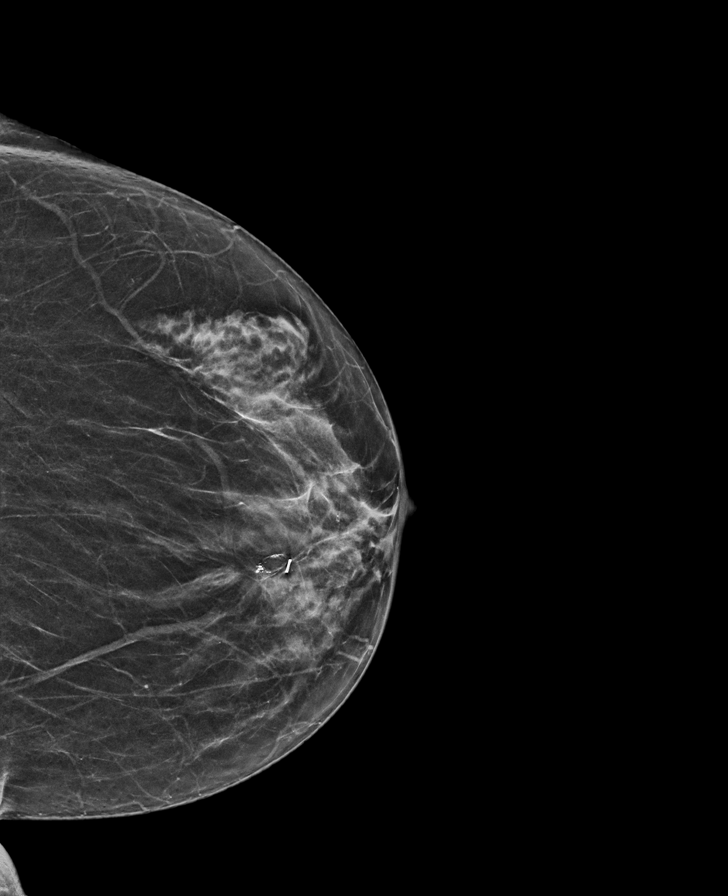

[R CC synth-2D]
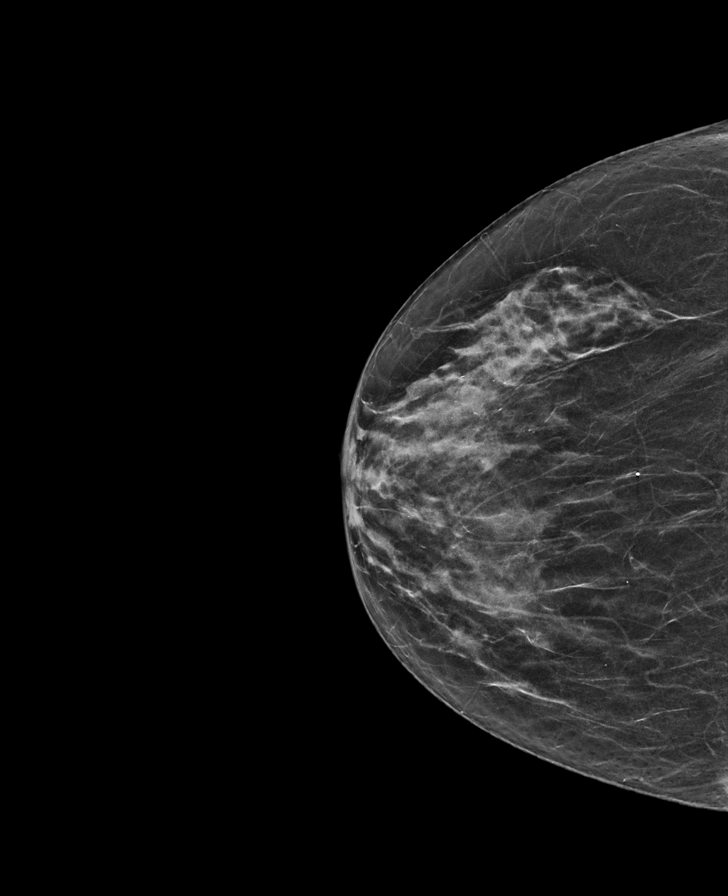

[R MLO synth-2D]
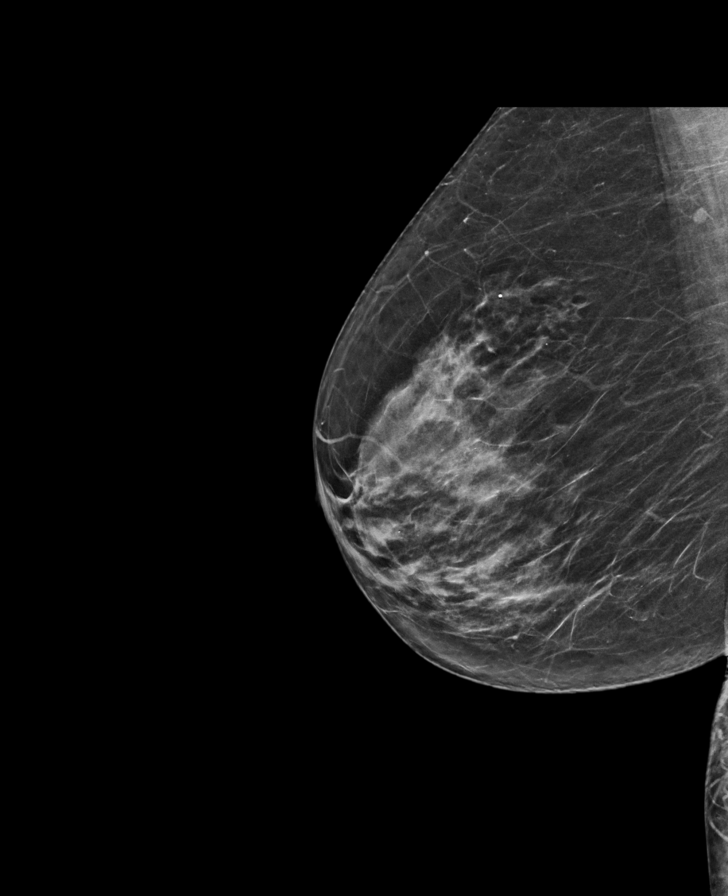

[R CC tomo · tomo slice 27/54.0]
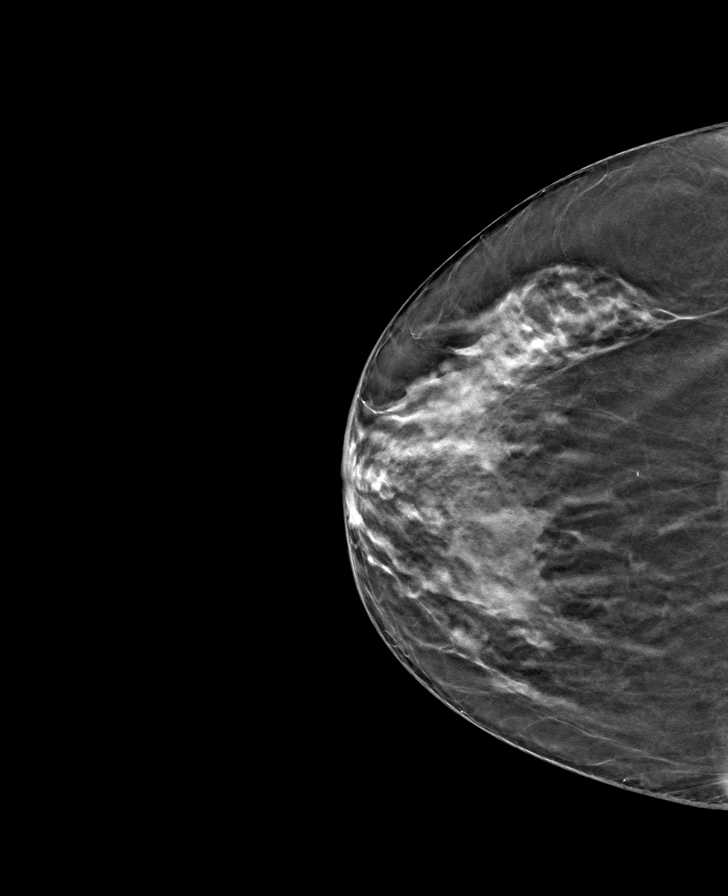

[L MLO tomo · tomo slice 33/66.0]
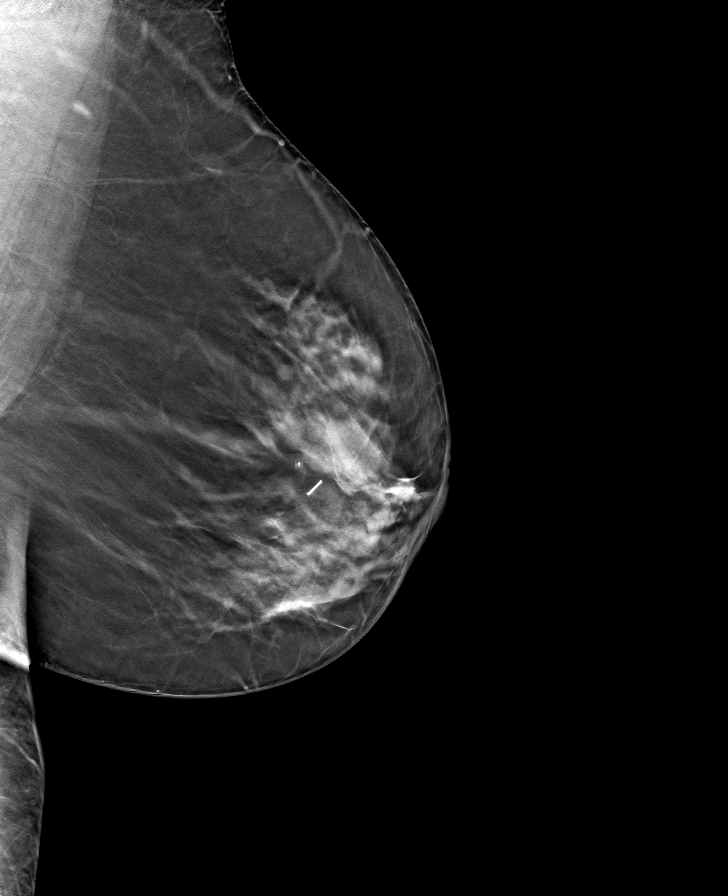

[L CC tomo · tomo slice 27/54.0]
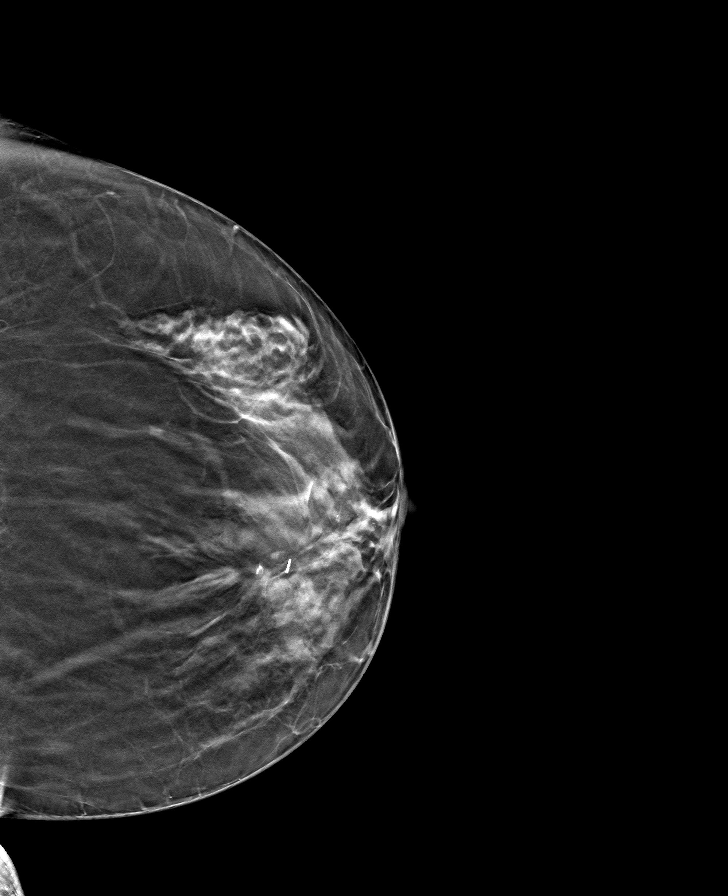

[R MLO tomo · tomo slice 32/63.0]
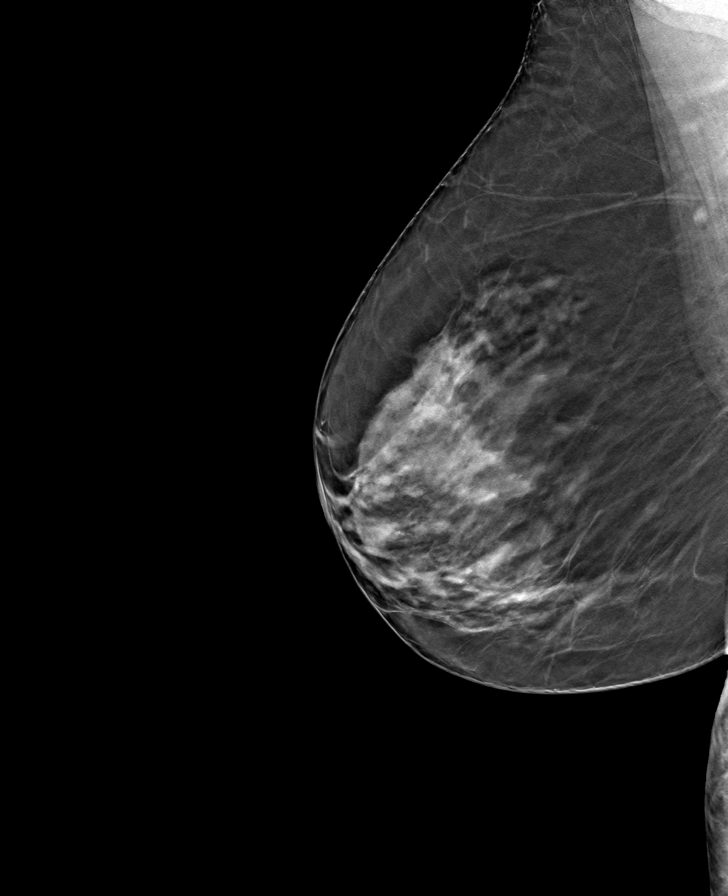

[8 of 24 positions shown; findings below may reference images not displayed]

ACR Breast Density Category b: There are scattered areas of
fibroglandular density.
FINDINGS: There are no findings suspicious for malignancy.
IMPRESSION: No mammographic evidence of malignancy. A result letter of this
screening mammogram will be mailed directly to the patient.

RECOMMENDATION:
Screening mammogram in one year. (Code:[BY])

BI-RADS CATEGORY  1: Negative.

## 2022-01-29 DIAGNOSIS — M1711 Unilateral primary osteoarthritis, right knee: Secondary | ICD-10-CM | POA: Diagnosis not present

## 2022-01-29 DIAGNOSIS — M1712 Unilateral primary osteoarthritis, left knee: Secondary | ICD-10-CM | POA: Diagnosis not present

## 2022-01-30 DIAGNOSIS — I10 Essential (primary) hypertension: Secondary | ICD-10-CM | POA: Diagnosis not present

## 2022-01-30 DIAGNOSIS — K219 Gastro-esophageal reflux disease without esophagitis: Secondary | ICD-10-CM | POA: Diagnosis not present

## 2022-01-30 DIAGNOSIS — E1169 Type 2 diabetes mellitus with other specified complication: Secondary | ICD-10-CM | POA: Diagnosis not present

## 2022-01-30 DIAGNOSIS — F4321 Adjustment disorder with depressed mood: Secondary | ICD-10-CM | POA: Diagnosis not present

## 2022-01-30 DIAGNOSIS — M47816 Spondylosis without myelopathy or radiculopathy, lumbar region: Secondary | ICD-10-CM | POA: Diagnosis not present

## 2022-01-30 DIAGNOSIS — E7800 Pure hypercholesterolemia, unspecified: Secondary | ICD-10-CM | POA: Diagnosis not present

## 2022-01-30 DIAGNOSIS — J4520 Mild intermittent asthma, uncomplicated: Secondary | ICD-10-CM | POA: Diagnosis not present

## 2022-02-05 DIAGNOSIS — M1711 Unilateral primary osteoarthritis, right knee: Secondary | ICD-10-CM | POA: Diagnosis not present

## 2022-02-05 DIAGNOSIS — M1712 Unilateral primary osteoarthritis, left knee: Secondary | ICD-10-CM | POA: Diagnosis not present

## 2022-03-05 DIAGNOSIS — M25462 Effusion, left knee: Secondary | ICD-10-CM | POA: Diagnosis not present

## 2022-04-02 DIAGNOSIS — M25562 Pain in left knee: Secondary | ICD-10-CM | POA: Diagnosis not present

## 2022-04-02 DIAGNOSIS — M1711 Unilateral primary osteoarthritis, right knee: Secondary | ICD-10-CM | POA: Diagnosis not present

## 2022-04-02 DIAGNOSIS — M25462 Effusion, left knee: Secondary | ICD-10-CM | POA: Diagnosis not present

## 2022-04-10 DIAGNOSIS — M25562 Pain in left knee: Secondary | ICD-10-CM | POA: Diagnosis not present

## 2022-04-10 DIAGNOSIS — M25662 Stiffness of left knee, not elsewhere classified: Secondary | ICD-10-CM | POA: Diagnosis not present

## 2022-04-10 DIAGNOSIS — M25561 Pain in right knee: Secondary | ICD-10-CM | POA: Diagnosis not present

## 2022-04-10 DIAGNOSIS — M6281 Muscle weakness (generalized): Secondary | ICD-10-CM | POA: Diagnosis not present

## 2022-04-13 DIAGNOSIS — M25562 Pain in left knee: Secondary | ICD-10-CM | POA: Diagnosis not present

## 2022-04-13 DIAGNOSIS — M25662 Stiffness of left knee, not elsewhere classified: Secondary | ICD-10-CM | POA: Diagnosis not present

## 2022-04-13 DIAGNOSIS — M25561 Pain in right knee: Secondary | ICD-10-CM | POA: Diagnosis not present

## 2022-04-13 DIAGNOSIS — M6281 Muscle weakness (generalized): Secondary | ICD-10-CM | POA: Diagnosis not present

## 2022-04-17 DIAGNOSIS — M6281 Muscle weakness (generalized): Secondary | ICD-10-CM | POA: Diagnosis not present

## 2022-04-17 DIAGNOSIS — M25561 Pain in right knee: Secondary | ICD-10-CM | POA: Diagnosis not present

## 2022-04-17 DIAGNOSIS — M25562 Pain in left knee: Secondary | ICD-10-CM | POA: Diagnosis not present

## 2022-04-17 DIAGNOSIS — M25662 Stiffness of left knee, not elsewhere classified: Secondary | ICD-10-CM | POA: Diagnosis not present

## 2022-04-19 DIAGNOSIS — M25562 Pain in left knee: Secondary | ICD-10-CM | POA: Diagnosis not present

## 2022-04-19 DIAGNOSIS — M25662 Stiffness of left knee, not elsewhere classified: Secondary | ICD-10-CM | POA: Diagnosis not present

## 2022-04-19 DIAGNOSIS — M25561 Pain in right knee: Secondary | ICD-10-CM | POA: Diagnosis not present

## 2022-04-19 DIAGNOSIS — M6281 Muscle weakness (generalized): Secondary | ICD-10-CM | POA: Diagnosis not present

## 2022-04-24 DIAGNOSIS — M6281 Muscle weakness (generalized): Secondary | ICD-10-CM | POA: Diagnosis not present

## 2022-04-24 DIAGNOSIS — M25562 Pain in left knee: Secondary | ICD-10-CM | POA: Diagnosis not present

## 2022-04-24 DIAGNOSIS — M25662 Stiffness of left knee, not elsewhere classified: Secondary | ICD-10-CM | POA: Diagnosis not present

## 2022-04-24 DIAGNOSIS — M25561 Pain in right knee: Secondary | ICD-10-CM | POA: Diagnosis not present

## 2022-04-27 DIAGNOSIS — M25562 Pain in left knee: Secondary | ICD-10-CM | POA: Diagnosis not present

## 2022-04-27 DIAGNOSIS — M6281 Muscle weakness (generalized): Secondary | ICD-10-CM | POA: Diagnosis not present

## 2022-04-27 DIAGNOSIS — M25561 Pain in right knee: Secondary | ICD-10-CM | POA: Diagnosis not present

## 2022-04-27 DIAGNOSIS — M25662 Stiffness of left knee, not elsewhere classified: Secondary | ICD-10-CM | POA: Diagnosis not present

## 2022-05-01 DIAGNOSIS — M25562 Pain in left knee: Secondary | ICD-10-CM | POA: Diagnosis not present

## 2022-05-01 DIAGNOSIS — M25561 Pain in right knee: Secondary | ICD-10-CM | POA: Diagnosis not present

## 2022-05-01 DIAGNOSIS — M25662 Stiffness of left knee, not elsewhere classified: Secondary | ICD-10-CM | POA: Diagnosis not present

## 2022-05-01 DIAGNOSIS — M6281 Muscle weakness (generalized): Secondary | ICD-10-CM | POA: Diagnosis not present

## 2022-05-04 DIAGNOSIS — M25562 Pain in left knee: Secondary | ICD-10-CM | POA: Diagnosis not present

## 2022-05-04 DIAGNOSIS — M6281 Muscle weakness (generalized): Secondary | ICD-10-CM | POA: Diagnosis not present

## 2022-05-04 DIAGNOSIS — M25561 Pain in right knee: Secondary | ICD-10-CM | POA: Diagnosis not present

## 2022-05-04 DIAGNOSIS — M25662 Stiffness of left knee, not elsewhere classified: Secondary | ICD-10-CM | POA: Diagnosis not present

## 2022-05-11 DIAGNOSIS — M6281 Muscle weakness (generalized): Secondary | ICD-10-CM | POA: Diagnosis not present

## 2022-05-11 DIAGNOSIS — M25662 Stiffness of left knee, not elsewhere classified: Secondary | ICD-10-CM | POA: Diagnosis not present

## 2022-05-11 DIAGNOSIS — M25562 Pain in left knee: Secondary | ICD-10-CM | POA: Diagnosis not present

## 2022-05-11 DIAGNOSIS — M25561 Pain in right knee: Secondary | ICD-10-CM | POA: Diagnosis not present

## 2022-05-22 DIAGNOSIS — I10 Essential (primary) hypertension: Secondary | ICD-10-CM | POA: Diagnosis not present

## 2022-05-22 DIAGNOSIS — E7800 Pure hypercholesterolemia, unspecified: Secondary | ICD-10-CM | POA: Diagnosis not present

## 2022-05-22 DIAGNOSIS — E1169 Type 2 diabetes mellitus with other specified complication: Secondary | ICD-10-CM | POA: Diagnosis not present

## 2022-05-29 DIAGNOSIS — M25561 Pain in right knee: Secondary | ICD-10-CM | POA: Diagnosis not present

## 2022-05-29 DIAGNOSIS — M6281 Muscle weakness (generalized): Secondary | ICD-10-CM | POA: Diagnosis not present

## 2022-05-29 DIAGNOSIS — M25562 Pain in left knee: Secondary | ICD-10-CM | POA: Diagnosis not present

## 2022-05-29 DIAGNOSIS — M25662 Stiffness of left knee, not elsewhere classified: Secondary | ICD-10-CM | POA: Diagnosis not present

## 2022-06-07 DIAGNOSIS — M6281 Muscle weakness (generalized): Secondary | ICD-10-CM | POA: Diagnosis not present

## 2022-06-07 DIAGNOSIS — M25561 Pain in right knee: Secondary | ICD-10-CM | POA: Diagnosis not present

## 2022-06-07 DIAGNOSIS — M25662 Stiffness of left knee, not elsewhere classified: Secondary | ICD-10-CM | POA: Diagnosis not present

## 2022-06-07 DIAGNOSIS — M25562 Pain in left knee: Secondary | ICD-10-CM | POA: Diagnosis not present

## 2022-06-12 DIAGNOSIS — M25561 Pain in right knee: Secondary | ICD-10-CM | POA: Diagnosis not present

## 2022-06-12 DIAGNOSIS — M25662 Stiffness of left knee, not elsewhere classified: Secondary | ICD-10-CM | POA: Diagnosis not present

## 2022-06-12 DIAGNOSIS — M25562 Pain in left knee: Secondary | ICD-10-CM | POA: Diagnosis not present

## 2022-06-12 DIAGNOSIS — M6281 Muscle weakness (generalized): Secondary | ICD-10-CM | POA: Diagnosis not present

## 2022-06-21 DIAGNOSIS — M6281 Muscle weakness (generalized): Secondary | ICD-10-CM | POA: Diagnosis not present

## 2022-06-21 DIAGNOSIS — M25561 Pain in right knee: Secondary | ICD-10-CM | POA: Diagnosis not present

## 2022-06-21 DIAGNOSIS — M25662 Stiffness of left knee, not elsewhere classified: Secondary | ICD-10-CM | POA: Diagnosis not present

## 2022-06-21 DIAGNOSIS — M25562 Pain in left knee: Secondary | ICD-10-CM | POA: Diagnosis not present

## 2022-06-29 ENCOUNTER — Encounter (INDEPENDENT_AMBULATORY_CARE_PROVIDER_SITE_OTHER): Payer: Self-pay

## 2022-06-29 DIAGNOSIS — I10 Essential (primary) hypertension: Secondary | ICD-10-CM | POA: Diagnosis not present

## 2022-06-29 DIAGNOSIS — J309 Allergic rhinitis, unspecified: Secondary | ICD-10-CM | POA: Diagnosis not present

## 2022-06-29 DIAGNOSIS — F419 Anxiety disorder, unspecified: Secondary | ICD-10-CM | POA: Diagnosis not present

## 2022-06-29 DIAGNOSIS — E1169 Type 2 diabetes mellitus with other specified complication: Secondary | ICD-10-CM | POA: Diagnosis not present

## 2022-06-29 DIAGNOSIS — E559 Vitamin D deficiency, unspecified: Secondary | ICD-10-CM | POA: Diagnosis not present

## 2022-06-29 DIAGNOSIS — E785 Hyperlipidemia, unspecified: Secondary | ICD-10-CM | POA: Diagnosis not present

## 2022-06-29 DIAGNOSIS — I479 Paroxysmal tachycardia, unspecified: Secondary | ICD-10-CM | POA: Diagnosis not present

## 2022-06-29 DIAGNOSIS — G8929 Other chronic pain: Secondary | ICD-10-CM | POA: Diagnosis not present

## 2022-06-29 DIAGNOSIS — E669 Obesity, unspecified: Secondary | ICD-10-CM | POA: Diagnosis not present

## 2022-06-29 DIAGNOSIS — H259 Unspecified age-related cataract: Secondary | ICD-10-CM | POA: Diagnosis not present

## 2022-06-29 DIAGNOSIS — E1136 Type 2 diabetes mellitus with diabetic cataract: Secondary | ICD-10-CM | POA: Diagnosis not present

## 2022-06-29 DIAGNOSIS — D573 Sickle-cell trait: Secondary | ICD-10-CM | POA: Diagnosis not present

## 2022-07-18 DIAGNOSIS — I10 Essential (primary) hypertension: Secondary | ICD-10-CM | POA: Diagnosis not present

## 2022-07-18 DIAGNOSIS — E559 Vitamin D deficiency, unspecified: Secondary | ICD-10-CM | POA: Diagnosis not present

## 2022-07-18 DIAGNOSIS — E7800 Pure hypercholesterolemia, unspecified: Secondary | ICD-10-CM | POA: Diagnosis not present

## 2022-07-18 DIAGNOSIS — E1169 Type 2 diabetes mellitus with other specified complication: Secondary | ICD-10-CM | POA: Diagnosis not present

## 2022-07-23 DIAGNOSIS — E559 Vitamin D deficiency, unspecified: Secondary | ICD-10-CM | POA: Diagnosis not present

## 2022-07-23 DIAGNOSIS — E1169 Type 2 diabetes mellitus with other specified complication: Secondary | ICD-10-CM | POA: Diagnosis not present

## 2022-07-23 DIAGNOSIS — F4321 Adjustment disorder with depressed mood: Secondary | ICD-10-CM | POA: Diagnosis not present

## 2022-07-23 DIAGNOSIS — Z0000 Encounter for general adult medical examination without abnormal findings: Secondary | ICD-10-CM | POA: Diagnosis not present

## 2022-07-23 DIAGNOSIS — E7800 Pure hypercholesterolemia, unspecified: Secondary | ICD-10-CM | POA: Diagnosis not present

## 2022-07-23 DIAGNOSIS — H9319 Tinnitus, unspecified ear: Secondary | ICD-10-CM | POA: Diagnosis not present

## 2022-07-23 DIAGNOSIS — I471 Supraventricular tachycardia: Secondary | ICD-10-CM | POA: Diagnosis not present

## 2022-07-23 DIAGNOSIS — I10 Essential (primary) hypertension: Secondary | ICD-10-CM | POA: Diagnosis not present

## 2022-08-21 DIAGNOSIS — E559 Vitamin D deficiency, unspecified: Secondary | ICD-10-CM | POA: Diagnosis not present

## 2022-08-21 DIAGNOSIS — I10 Essential (primary) hypertension: Secondary | ICD-10-CM | POA: Diagnosis not present

## 2022-08-21 DIAGNOSIS — E1169 Type 2 diabetes mellitus with other specified complication: Secondary | ICD-10-CM | POA: Diagnosis not present

## 2022-08-21 DIAGNOSIS — E7800 Pure hypercholesterolemia, unspecified: Secondary | ICD-10-CM | POA: Diagnosis not present

## 2022-08-21 DIAGNOSIS — K5900 Constipation, unspecified: Secondary | ICD-10-CM | POA: Diagnosis not present

## 2022-09-04 DIAGNOSIS — H903 Sensorineural hearing loss, bilateral: Secondary | ICD-10-CM | POA: Diagnosis not present

## 2022-09-04 DIAGNOSIS — H6123 Impacted cerumen, bilateral: Secondary | ICD-10-CM | POA: Diagnosis not present

## 2022-09-04 DIAGNOSIS — H93A3 Pulsatile tinnitus, bilateral: Secondary | ICD-10-CM | POA: Diagnosis not present

## 2023-01-22 DIAGNOSIS — I4719 Other supraventricular tachycardia: Secondary | ICD-10-CM | POA: Diagnosis not present

## 2023-01-22 DIAGNOSIS — L821 Other seborrheic keratosis: Secondary | ICD-10-CM | POA: Diagnosis not present

## 2023-01-22 DIAGNOSIS — E1169 Type 2 diabetes mellitus with other specified complication: Secondary | ICD-10-CM | POA: Diagnosis not present

## 2023-01-22 DIAGNOSIS — I10 Essential (primary) hypertension: Secondary | ICD-10-CM | POA: Diagnosis not present

## 2023-01-22 DIAGNOSIS — F411 Generalized anxiety disorder: Secondary | ICD-10-CM | POA: Diagnosis not present

## 2023-01-22 DIAGNOSIS — Z6831 Body mass index (BMI) 31.0-31.9, adult: Secondary | ICD-10-CM | POA: Diagnosis not present

## 2023-02-07 DIAGNOSIS — G458 Other transient cerebral ischemic attacks and related syndromes: Secondary | ICD-10-CM | POA: Diagnosis not present

## 2023-02-07 DIAGNOSIS — H02889 Meibomian gland dysfunction of unspecified eye, unspecified eyelid: Secondary | ICD-10-CM | POA: Diagnosis not present

## 2023-02-07 DIAGNOSIS — H53459 Other localized visual field defect, unspecified eye: Secondary | ICD-10-CM | POA: Diagnosis not present

## 2023-02-07 DIAGNOSIS — H25813 Combined forms of age-related cataract, bilateral: Secondary | ICD-10-CM | POA: Diagnosis not present

## 2023-02-07 DIAGNOSIS — E119 Type 2 diabetes mellitus without complications: Secondary | ICD-10-CM | POA: Diagnosis not present

## 2023-02-07 DIAGNOSIS — H35033 Hypertensive retinopathy, bilateral: Secondary | ICD-10-CM | POA: Diagnosis not present

## 2023-02-12 DIAGNOSIS — Z1231 Encounter for screening mammogram for malignant neoplasm of breast: Secondary | ICD-10-CM | POA: Diagnosis not present

## 2023-02-18 DIAGNOSIS — Z6831 Body mass index (BMI) 31.0-31.9, adult: Secondary | ICD-10-CM | POA: Diagnosis not present

## 2023-02-18 DIAGNOSIS — Z01419 Encounter for gynecological examination (general) (routine) without abnormal findings: Secondary | ICD-10-CM | POA: Diagnosis not present

## 2023-07-02 DIAGNOSIS — H93A3 Pulsatile tinnitus, bilateral: Secondary | ICD-10-CM | POA: Diagnosis not present

## 2023-07-02 DIAGNOSIS — H903 Sensorineural hearing loss, bilateral: Secondary | ICD-10-CM | POA: Diagnosis not present

## 2023-08-05 DIAGNOSIS — E7800 Pure hypercholesterolemia, unspecified: Secondary | ICD-10-CM | POA: Diagnosis not present

## 2023-08-05 DIAGNOSIS — E559 Vitamin D deficiency, unspecified: Secondary | ICD-10-CM | POA: Diagnosis not present

## 2023-08-05 DIAGNOSIS — E1169 Type 2 diabetes mellitus with other specified complication: Secondary | ICD-10-CM | POA: Diagnosis not present

## 2023-08-05 DIAGNOSIS — I10 Essential (primary) hypertension: Secondary | ICD-10-CM | POA: Diagnosis not present

## 2023-08-12 DIAGNOSIS — H9319 Tinnitus, unspecified ear: Secondary | ICD-10-CM | POA: Diagnosis not present

## 2023-08-12 DIAGNOSIS — Z0000 Encounter for general adult medical examination without abnormal findings: Secondary | ICD-10-CM | POA: Diagnosis not present

## 2023-08-12 DIAGNOSIS — K219 Gastro-esophageal reflux disease without esophagitis: Secondary | ICD-10-CM | POA: Diagnosis not present

## 2023-08-12 DIAGNOSIS — I10 Essential (primary) hypertension: Secondary | ICD-10-CM | POA: Diagnosis not present

## 2023-08-12 DIAGNOSIS — K5900 Constipation, unspecified: Secondary | ICD-10-CM | POA: Diagnosis not present

## 2023-08-12 DIAGNOSIS — J4520 Mild intermittent asthma, uncomplicated: Secondary | ICD-10-CM | POA: Diagnosis not present

## 2023-08-12 DIAGNOSIS — E119 Type 2 diabetes mellitus without complications: Secondary | ICD-10-CM | POA: Diagnosis not present

## 2023-08-12 DIAGNOSIS — D485 Neoplasm of uncertain behavior of skin: Secondary | ICD-10-CM | POA: Diagnosis not present

## 2023-08-12 DIAGNOSIS — E1169 Type 2 diabetes mellitus with other specified complication: Secondary | ICD-10-CM | POA: Diagnosis not present

## 2023-08-12 DIAGNOSIS — E7800 Pure hypercholesterolemia, unspecified: Secondary | ICD-10-CM | POA: Diagnosis not present

## 2023-08-12 DIAGNOSIS — E559 Vitamin D deficiency, unspecified: Secondary | ICD-10-CM | POA: Diagnosis not present

## 2023-08-27 NOTE — Progress Notes (Signed)
PATIENT: Jennifer [name] DOB: 1951-04-16  REASON FOR VISIT: follow up HISTORY FROM: patient  Chief Complaint  Patient presents with   Follow-up    Pt in 4  Pt here for memory f/u Pt states short term memory is worse Pt states she is a little depressed      HISTORY OF PRESENT ILLNESS: Today 08/27/23:  Jennifer [name] is a 72 y.o. female with a history of memory disturbance. Returns today for follow-up.  She has not been seen since 2022.  She feels that her memory is worse.  She does not knowledge that she does feel little depressed.  Husband passed a while back. Feels like she has PTSD from dealing with her husband's sickness. She lives at home alone.  Able to complete all ADLs independently.  Continues to manage her own medications finances and appointments. Reports that she starts things but not complete it. Housework is an example- she doesn't want to do it. Trying to get better. Returns today for follow-up.   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  No acute abnormalities are noted.   11/21/21: Jennifer [name] is a 72 year old female with a history of memory disturbance. She returns today for follow-up. Returns today for follow-up.  She continues to live at home alone.  She is able to complete all ADLs independently.  Manages her own medications, finances and appointments.  She states that she recently started back working at [employer] 8 hours a week.  She denies any new symptoms.  Reports that she did start a B complex vitamin and feels that has been beneficial for her memory . she returns today for an evaluation.  05/18/21: Jennifer [name] is a 73 year old female with a history of memory disturbance.  She returns today for follow-up.  She  feels that her memory has improved.  She lives at home alone.  She is able to complete all ADLs independently.  She reports that she has some physical limitations with her housework.  Manages her finances.  She manages her appointments and medications.  She had an MRI that was relatively unremarkable with exception of an old infarct.  Sleep study was unremarkable.  Patient returns today for evaluation.  HISTORY 12/27/20: Jennifer [name] is a 71 y.o. year old Black or [place] American female patient seen here as a referral on 12/27/2020 from Dr. Lucia Gaskins.    Chief concern according to patient : "My memory changed when I lost my husband, March 2021, and there was drama with is children for 2-3 years before."       I have the pleasure of seeing Jennifer [name] today, a right -handed Black or [place] American female with a possible sleep disorder.    Jennifer [name] is a 72 y.o. female here as requested by Daisy Floro, MD for memory changes. PMHx HTN, DM2, HLD, osteoarthritis, back pain, kidney stones, drug-induced myopathy and myalgia, carpal tunnel syndrome, memory changes, grief reaction. I reviewed Dr. Charlott Rakes notes: Patient feels tired, poor sleep, stressed, now on prednisone and her A1c is up to 7.28, no recent lorazepam use, she is worried about her memory she is gotten lost a few times and she is very tired a lot, B12 and thyroid  were checked, vitamin D, lipid panel were also checked, vitamin D was normal 62.6, CMP showed creatinine 0.62 and BUN 11 otherwise unremarkable labs were drawn 2020-09-07, B12 was low at 219 we will recheck. TSH normal.    Patient reports ongoing for 2 years, worsening, patient always has to use her GPS, it was worse several months ago. She is leaving things in places. She has a lot of stress the last few years, forgetting things, simple things, getting worse, she needs a nap every day, husband died this year and there is grief, even cooking she is forgetting, to prepare  a big meal is difficult when she cooks for people, she has to write things down, ongoing 2 years, no hx of dementi ain the family that she knows of, her bills are paid, she has to use her GPS, she has to think where she is but not getting lost. No weakness, no sensory changes, husband had dementia and she was his caregiver. No other focal neurologic deficits, associated symptoms, inciting events or modifiable factors.     Sleep relevant medical history: see above. 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    Family medical /sleep history: no other family member on CPAP with OSA.    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  She lives in a household alone, widowed. No biological children, Pets are not present. Tobacco use: none  ETOH use ; none,  Caffeine intake in form of Coffee( /) Soda( /) Tea ( /) - less than a cup in 3 days.  No regular exercise .           Sleep habits are as follows: The patient's lunch time is between 12-2  PM, and last meal is 5-6 PM.  The patient goes to bed at 6 or at 10 PM, she is actively trying to change that.   and continues to sleep for several hours, wakes for 1-2 bathroom breaks, the first time at 3 AM.     The preferred sleep position is on her left side , with the support of 1 pillow.  Bedroom is cool, quiet and dark.  Dreams are reportedly rare. 5.30 AM is the usual rise time.  The patient wakes up spontaneously. She reports not feeling refreshed or restored in AM, with symptoms such as dry mouth , but no morning headaches , and residual fatigue.  Naps are taken routinely, lasting from 2-3 Pm and is much more refreshing than nocturnal sleep.     REVIEW OF SYSTEMS: Out of a complete 14 system review of symptoms, the patient complains only of the following symptoms, and all other reviewed systems are negative.  ALLERGIES: Allergies  Allergen Reactions   Accupril  [Quinapril Hcl] Other (See Comments) and Cough    NUMBNESS AND TINGLING   Penicillins Hives    Reaction: Childhood   Cephalosporins Hives and Itching   Other Hives   Pioglitazone     Other reaction(s): Numbness   Rosuvastatin     Other reaction(s): Myalgias   Nitroglycerin Rash    PATCH Other reaction(s): Itch    HOME MEDICATIONS: Outpatient Medications Prior to Visit  Medication Sig Dispense Refill   acetaminophen (TYLENOL) 500 MG tablet Take 1,000 mg by mouth every 6 (six) hours as needed for moderate pain or mild pain.     albuterol (VENTOLIN HFA) 108 (90 Base) MCG/ACT inhaler Inhale 1-2 puffs into the lungs  every 6 (six) hours as needed for wheezing or shortness of breath.     aspirin EC 81 MG tablet Take 81 mg by mouth daily.     b complex vitamins capsule Take 1 capsule by mouth every Tuesday, Thursday, and Saturday at 6 PM.     Betamethasone Dipropionate 0.05 % EMUL 2 drops 2 (two) times daily. Both ear     carvedilol (COREG) 6.25 MG tablet Take 6.25 mg by mouth 2 (two) times daily with a meal.     Cholecalciferol (VITAMIN D-3) 125 MCG (5000 UT) TABS Take 5,000 Units by mouth every Monday, Wednesday, and Friday.     Continuous Blood Gluc Receiver (FREESTYLE LIBRE 14 DAY READER) DEVI Inject 1 application into the skin every 14 (fourteen) days.     Continuous Blood Gluc Sensor (FREESTYLE LIBRE SENSOR SYSTEM) MISC by Does not apply route.     diphenhydramine-acetaminophen (TYLENOL PM) 25-500 MG TABS tablet Take 1 tablet by mouth at bedtime as needed (Sleep).     Dulaglutide 1.5 MG/0.5ML SOPN Inject 1.5 mg into the skin once a week. Tuesday     ezetimibe (ZETIA) 10 MG tablet Take 10 mg by mouth daily. In am     Fexofenadine HCl (ALLEGRA PO) Take 180 mg by mouth daily as needed (allergies). Takes for a month- alternates with Claritin each month.     fluticasone (FLONASE) 50 MCG/ACT nasal spray Place 1 spray into both nostrils daily as needed for allergies or rhinitis.     ibuprofen  (ADVIL) 600 MG tablet Take 600 mg by mouth daily as needed for moderate pain or mild pain.     lansoprazole (PREVACID) 30 MG capsule Take 30 mg by mouth every evening.     LINZESS 145 MCG CAPS capsule Take 145 mcg by mouth every morning.     LORazepam (ATIVAN) 0.5 MG tablet Take 0.5 mg by mouth every 8 (eight) hours as needed for anxiety.     MINERAL OIL PO Place 1 drop into both ears once a week.     Misc Natural Products (GLUCOSAMINE CHOND MSM FORMULA) TABS Take 1 tablet by mouth 2 (two) times daily.     nystatin-triamcinolone ointment (MYCOLOG) Apply 1 application topically 2 (two) times daily. Both ear     ondansetron (ZOFRAN) 4 MG tablet Take 4 mg by mouth every 6 (six) hours as needed for nausea.     telmisartan-hydrochlorothiazide (MICARDIS HCT) 80-12.5 MG tablet Take 1 tablet by mouth daily.     tiZANidine (ZANAFLEX) 2 MG tablet Take 2-4 mg by mouth 3 (three) times daily as needed for muscle spasms.     traMADol (ULTRAM) 50 MG tablet Take 50-100 mg by mouth 3 (three) times daily as needed for pain.     Turmeric 450 MG CAPS Take 450 mg by mouth daily.     verapamil (VERELAN PM) 240 MG 24 hr capsule Take 240 mg by mouth at bedtime. CAN ONLY TAKE ACTAVIS BRAND YELLOW AND BLUE CAPSULE     No facility-administered medications prior to visit.    PAST MEDICAL HISTORY: Past Medical History:  Diagnosis Date   Anxiety    Asthma    Bronchitis    Carpal tunnel syndrome on both sides    left worse than right   Degenerative arthritis of cervical spine    Depressive disorder, not elsewhere classified    Diabetes (HCC)    Family history of heart disease    younger brother died from MI age 47  GERD (gastroesophageal reflux disease)    Headache    migraines   History of asthma    no longer requires med., per pt.   History of kidney stones    Hypercholesteremia    Hyperlipidemia    Hypertension    states under control with meds., has been on med. 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   Sickle cell trait (HCC)    Stroke [facility])    exact date unknown   Vitamin D deficiency     PAST SURGICAL HISTORY: Past Surgical History:  Procedure Laterality Date   BREAST LUMPECTOMY WITH RADIOACTIVE SEED LOCALIZATION Left 01/30/2018   Procedure: LEFT BREAST BRACKETED LUMPECTOMY WITH RADIOACTIVE SEED LOCALIZATION;  Surgeon: Abigail Miyamoto, MD;  Location: [facility];  Service: General;  Laterality: Left;   BREAST SURGERY Left 2009   calcification to left breast   CESAREAN SECTION  1982   DILATION AND CURETTAGE OF UTERUS     ENDOMETRIAL ABLATION  1991   TUBAL LIGATION  1982    FAMILY HISTORY: Family History  Problem Relation Age of Onset   Diabetes Mother        DM   Hypertension Father    Diabetes Father        DM   Heart attack Brother    CAD Brother    Heart disease Paternal Grandfather    Diabetes Brother        DM   Heart attack Brother    CAD Brother    Lupus Other        Family H/O of Lupus   Breast cancer Cousin        unsure of age   Dementia Neg Hx    Alzheimer's disease Neg Hx     SOCIAL HISTORY: Social History   Socioeconomic History   Marital status: Widowed    Spouse name: Not on file   Number of children: 2   Years of education: 12   Highest education level: Bachelor's degree (e.g., BA, AB, BS)  Occupational History   Occupation: Part-Time at [employer]  Tobacco Use   Smoking status: Never   Smokeless tobacco: Never  Vaping Use   Vaping status: Never Used  Substance and Sexual Activity   Alcohol use: No   Drug use: No   Sexual activity: Not Currently  Other Topics Concern   Not on file  Social History Narrative   Retired recently as a Engineer, civil (consulting)   Right handed   Caffeine: decaf coffee occasionally    Lives  at home alone      Patient has 2 biological adult children, Darnell and Cornelia, and 5 grandchildren    Social Determinants of Health   Financial Resource Strain: Low Risk  (09/21/2020)   Overall Financial Resource Strain (CARDIA)    Difficulty of Paying Living Expenses: Not very hard  Food Insecurity: Low Risk  (07/02/2023)   Received from [facility]   Hunger Vital Sign    Worried About Running Out of Food in the Last Year: Never true    Ran Out of Food in the Last Year: Never true  Transportation Needs: Not on file (07/02/2023)  Physical Activity: Inactive (09/21/2020)   Exercise Vital Sign  Days of Exercise per Week: 0 days    Minutes of Exercise per Session: 0 min  Stress: No Stress Concern Present (09/21/2020)   [employer] of Occupational Health - Occupational Stress Questionnaire    Feeling of Stress : Only a little  Social Connections: Moderately Isolated (09/21/2020)   Social Connection and Isolation Panel [NHANES]    Frequency of Communication with Friends and Family: More than three times a week    Frequency of Social Gatherings with Friends and Family: More than three times a week    Attends Religious Services: More than 4 times per year    Active Member of [employer] or Organizations: No    Attends Banker Meetings: Never    Marital Status: Widowed  Intimate Partner Violence: Not At Risk (09/21/2020)   Humiliation, Afraid, Rape, and Kick questionnaire    Fear of Current or Ex-Partner: No    Emotionally Abused: No    Physically Abused: No    Sexually Abused: No      PHYSICAL EXAM  Vitals:   08/28/23 1146  BP: 124/70  Pulse: 80  Weight: 156 lb 6.4 oz (70.9 kg)  Height: 4\' 11"  (1.499 m)     Body mass index is 31.59 kg/m.      08/28/2023   11:48 AM 11/21/2021    3:07 PM 05/18/2021    1:19 PM  MMSE - Mini Mental State Exam  Orientation to time 5 5 5   Orientation to Place 5 5 5   Registration 3 3 3   Attention/ Calculation 1 1 0  Recall 3 3  3   Language- name 2 objects 2 2 2   Language- repeat 0 1 1  Language- follow 3 step command 3 3 3   Language- read & follow direction 1 1 1   Write a sentence 1 1 1   Copy design 1 1 0  Total score 25 26 24      Generalized: Well developed, in no acute distress   Neurological examination  Mentation: Alert oriented to time, place, history taking. Follows all commands speech and language fluent Cranial nerve II-XII: Pupils were equal round reactive to light. Extraocular movements were full, visual field were full on confrontational test. Facial sensation and strength were normal. Uvula tongue midline. Head turning and shoulder shrug  were normal and symmetric. Motor: The motor testing reveals 5 over 5 strength of all 4 extremities. Good symmetric motor tone is noted throughout.  Sensory: Sensory testing is intact to soft touch on all 4 extremities. No evidence of extinction is noted.  Coordination: Cerebellar testing reveals good finger-nose-finger and heel-to-shin bilaterally.  Gait and station: Gait is normal.  Reflexes: Deep tendon reflexes are symmetric and normal bilaterally.   DIAGNOSTIC DATA (LABS, IMAGING, TESTING) - I reviewed patient records, labs, notes, testing and imaging myself where available.  Lab Results  Component Value Date   WBC 7.8 04/18/2021   HGB 14.3 04/18/2021   HCT 42.7 04/18/2021   MCV 87.9 04/18/2021   PLT 303 04/18/2021      Component Value Date/Time   NA 138 04/18/2021 0950   NA 143 11/16/2020 0842   K 3.6 04/18/2021 0950   CL 101 04/18/2021 0950   CO2 29 04/18/2021 0950   GLUCOSE 180 (H) 04/18/2021 0950   BUN 10 04/18/2021 0950   BUN 8 11/16/2020 0842   CREATININE 0.70 04/18/2021 0950   CREATININE 0.80 01/17/2015 1146   CALCIUM 9.7 04/18/2021 0950   GFRNONAA >60 04/18/2021 0950   GFRAA 95  11/16/2020 0842      ASSESSMENT AND PLAN 72 y.o. year old female  has a past medical history of Anxiety, Asthma, Bronchitis, Carpal tunnel syndrome on  both sides, Degenerative arthritis of cervical spine, Depressive disorder, not elsewhere classified, Diabetes (HCC), Family history of heart disease, GERD (gastroesophageal reflux disease), Headache, History of asthma, History of kidney stones, Hypercholesteremia, Hyperlipidemia, Hypertension, Immature cataract of both eyes, Memory difficulty, Non-insulin dependent type 2 diabetes mellitus (HCC), PAT (paroxysmal atrial tachycardia) (HCC) (1970's), Pneumonia, Sclerosing adenosis of breast, left (12/2017), Sickle cell trait (HCC), Stroke (HCC), and Vitamin D deficiency. here with :  1.  Memory disturbance  -- MMSE 25/30 previously 26/30 -- Could be due to depression- we discussed counseling. advised to discuss with PCP if she wanted to try medication for depression -- Follow-up in 6-7 months  or sooner if needed   Butch Penny, MSN, NP-C 08/27/2023, 4:25 PM [facility] [address] [place], [place] [address] [phone]

## 2023-08-28 ENCOUNTER — Encounter: Payer: Self-pay | Admitting: Adult Health

## 2023-08-28 ENCOUNTER — Ambulatory Visit: Payer: PPO | Admitting: Adult Health

## 2023-08-28 VITALS — BP 124/70 | HR 80 | Ht 59.0 in | Wt 156.4 lb

## 2023-08-28 DIAGNOSIS — R413 Other amnesia: Secondary | ICD-10-CM

## 2023-08-28 NOTE — Patient Instructions (Signed)
Your Plan: ? ?Continue ? ? ? ? ?Thank you for coming to see us at [facility]. I hope we have been able to provide you high quality care today. ? ?You may receive a patient satisfaction survey over the next few weeks. We would appreciate your feedback and comments so that we may continue to improve ourselves and the health of our patients. ? ?

## 2023-09-02 DIAGNOSIS — L859 Epidermal thickening, unspecified: Secondary | ICD-10-CM | POA: Diagnosis not present

## 2023-09-02 DIAGNOSIS — Z6830 Body mass index (BMI) 30.0-30.9, adult: Secondary | ICD-10-CM | POA: Diagnosis not present

## 2023-09-02 DIAGNOSIS — D485 Neoplasm of uncertain behavior of skin: Secondary | ICD-10-CM | POA: Diagnosis not present

## 2023-09-06 DIAGNOSIS — H93A3 Pulsatile tinnitus, bilateral: Secondary | ICD-10-CM | POA: Diagnosis not present

## 2023-09-06 DIAGNOSIS — H9041 Sensorineural hearing loss, unilateral, right ear, with unrestricted hearing on the contralateral side: Secondary | ICD-10-CM | POA: Diagnosis not present

## 2023-09-06 DIAGNOSIS — Z0110 Encounter for examination of ears and hearing without abnormal findings: Secondary | ICD-10-CM | POA: Diagnosis not present

## 2023-10-15 DIAGNOSIS — G458 Other transient cerebral ischemic attacks and related syndromes: Secondary | ICD-10-CM | POA: Diagnosis not present

## 2023-10-15 DIAGNOSIS — H35033 Hypertensive retinopathy, bilateral: Secondary | ICD-10-CM | POA: Diagnosis not present

## 2023-10-15 DIAGNOSIS — H25813 Combined forms of age-related cataract, bilateral: Secondary | ICD-10-CM | POA: Diagnosis not present

## 2023-10-15 DIAGNOSIS — E119 Type 2 diabetes mellitus without complications: Secondary | ICD-10-CM | POA: Diagnosis not present

## 2023-11-12 DIAGNOSIS — Z23 Encounter for immunization: Secondary | ICD-10-CM | POA: Diagnosis not present

## 2023-12-20 DIAGNOSIS — Z23 Encounter for immunization: Secondary | ICD-10-CM | POA: Diagnosis not present

## 2024-02-04 ENCOUNTER — Other Ambulatory Visit (HOSPITAL_COMMUNITY): Payer: Self-pay

## 2024-02-08 ENCOUNTER — Emergency Department (HOSPITAL_COMMUNITY): Payer: PPO

## 2024-02-08 ENCOUNTER — Emergency Department (HOSPITAL_COMMUNITY)
Admission: EM | Admit: 2024-02-08 | Discharge: 2024-02-08 | Disposition: A | Discharge status: Home / Self Care | Payer: PPO | Attending: Emergency Medicine | Admitting: Emergency Medicine

## 2024-02-08 ENCOUNTER — Encounter (HOSPITAL_COMMUNITY): Payer: Self-pay

## 2024-02-08 ENCOUNTER — Ambulatory Visit (HOSPITAL_COMMUNITY)
Admission: EM | Admit: 2024-02-08 | Discharge: 2024-02-08 | Disposition: A | Discharge status: Home / Self Care | Payer: PPO

## 2024-02-08 DIAGNOSIS — Z79899 Other long term (current) drug therapy: Secondary | ICD-10-CM | POA: Insufficient documentation

## 2024-02-08 DIAGNOSIS — Z87442 Personal history of urinary calculi: Secondary | ICD-10-CM | POA: Insufficient documentation

## 2024-02-08 DIAGNOSIS — E119 Type 2 diabetes mellitus without complications: Secondary | ICD-10-CM | POA: Insufficient documentation

## 2024-02-08 DIAGNOSIS — I10 Essential (primary) hypertension: Secondary | ICD-10-CM | POA: Diagnosis not present

## 2024-02-08 DIAGNOSIS — J45909 Unspecified asthma, uncomplicated: Secondary | ICD-10-CM | POA: Insufficient documentation

## 2024-02-08 DIAGNOSIS — R1031 Right lower quadrant pain: Secondary | ICD-10-CM | POA: Diagnosis present

## 2024-02-08 DIAGNOSIS — Z7982 Long term (current) use of aspirin: Secondary | ICD-10-CM | POA: Diagnosis not present

## 2024-02-08 DIAGNOSIS — Z8673 Personal history of transient ischemic attack (TIA), and cerebral infarction without residual deficits: Secondary | ICD-10-CM | POA: Diagnosis not present

## 2024-02-08 LAB — URINALYSIS, ROUTINE W REFLEX MICROSCOPIC
Bilirubin Urine: NEGATIVE
Glucose, UA: NEGATIVE mg/dL
Hgb urine dipstick: NEGATIVE
Ketones, ur: NEGATIVE mg/dL
Leukocytes,Ua: NEGATIVE
Nitrite: NEGATIVE
Protein, ur: NEGATIVE mg/dL
Specific Gravity, Urine: 1.021 (ref 1.005–1.030)
pH: 6 (ref 5.0–8.0)

## 2024-02-08 LAB — CBC
HCT: 39.3 % (ref 36.0–46.0)
Hemoglobin: 13.6 g/dL (ref 12.0–15.0)
MCH: 29.9 pg (ref 26.0–34.0)
MCHC: 34.6 g/dL (ref 30.0–36.0)
MCV: 86.4 fL (ref 80.0–100.0)
Platelets: 255 10*3/uL (ref 150–400)
RBC: 4.55 MIL/uL (ref 3.87–5.11)
RDW: 13.3 % (ref 11.5–15.5)
WBC: 7.4 10*3/uL (ref 4.0–10.5)
nRBC: 0 % (ref 0.0–0.2)

## 2024-02-08 LAB — COMPREHENSIVE METABOLIC PANEL
ALT: 24 U/L (ref 0–44)
AST: 20 U/L (ref 15–41)
Albumin: 4.1 g/dL (ref 3.5–5.0)
Alkaline Phosphatase: 33 U/L — ABNORMAL LOW (ref 38–126)
Anion gap: 12 (ref 5–15)
BUN: 8 mg/dL (ref 8–23)
CO2: 26 mmol/L (ref 22–32)
Calcium: 9.6 mg/dL (ref 8.9–10.3)
Chloride: 101 mmol/L (ref 98–111)
Creatinine, Ser: 0.67 mg/dL (ref 0.44–1.00)
GFR, Estimated: >60 mL/min (ref >60)
Glucose, Bld: 104 mg/dL — ABNORMAL HIGH (ref 70–99)
Potassium: 3.4 mmol/L — ABNORMAL LOW (ref 3.5–5.1)
Sodium: 139 mmol/L (ref 135–145)
Total Bilirubin: 0.8 mg/dL (ref 0.0–1.2)
Total Protein: 6.9 g/dL (ref 6.5–8.1)

## 2024-02-08 LAB — LIPASE, BLOOD: Lipase: 44 U/L (ref 11–51)

## 2024-02-08 MED ORDER — IOHEXOL 350 MG/ML SOLN
75.0000 mL | Freq: Once | INTRAVENOUS | Status: AC | PRN
  Administered 2024-02-08: 75 mL via INTRAVENOUS

## 2024-02-08 MED ORDER — MORPHINE SULFATE (PF) 4 MG/ML IV SOLN
4.0000 mg | Freq: Once | INTRAVENOUS | Status: AC
  Administered 2024-02-08: 4 mg via INTRAVENOUS
  Filled 2024-02-08: qty 1

## 2024-02-08 NOTE — ED Provider Notes (Signed)
[facility]    CSN: [id number] Arrival date & time: 02/08/24  1152      History   Chief Complaint Chief Complaint  Patient presents with   Abdominal Pain    HPI Jennifer [name] is a 73 y.o. female.   Patient present today with sharp shooting right lower abdominal pain that began yesterday.  Denies nausea, vomiting, diarrhea, constipation, fever, and urinary symptoms.   Abdominal Pain   Past Medical History:  Diagnosis Date   Anxiety    Asthma    Bronchitis    Carpal tunnel syndrome on both sides    left worse than right   Degenerative arthritis of cervical spine    Depressive disorder, not elsewhere classified    Diabetes (HCC)    Family history of heart disease    younger brother died from MI age 49   GERD (gastroesophageal reflux disease)    Headache    migraines   History of asthma    no longer requires med., per pt.   History of kidney stones    Hypercholesteremia    Hyperlipidemia    Hypertension    states under control with meds., has been on med. 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   Abnormal EKG 07/27/2014   Essential hypertension 07/27/2014   Diabetes mellitus type 2, uncontrolled, with complications 07/27/2014   Hyperlipidemia 07/27/2014   History of PSVT (paroxysmal supraventricular tachycardia) 07/27/2014    Past Surgical History:  Procedure Laterality Date   BREAST LUMPECTOMY WITH RADIOACTIVE SEED LOCALIZATION Left 01/30/2018   Procedure: LEFT  BREAST BRACKETED LUMPECTOMY WITH RADIOACTIVE SEED LOCALIZATION;  Surgeon: Abigail Miyamoto, MD;  Location: [facility];  Service: General;  Laterality: Left;   BREAST SURGERY Left 2009   calcification to left breast   CESAREAN SECTION  1982   DILATION AND CURETTAGE OF UTERUS     ENDOMETRIAL ABLATION  1991   TUBAL LIGATION  1982    OB History   No obstetric history on file.      Home Medications    Prior to Admission medications   Medication Sig Start Date End Date Taking? Authorizing Provider  acetaminophen (TYLENOL) 500 MG tablet Take 1,000 mg by mouth as needed for moderate pain or mild pain.    [provider]  albuterol (VENTOLIN HFA) 108 (90 Base) MCG/ACT inhaler Inhale 1-2 puffs into the lungs every 6 (six) hours as needed for wheezing or shortness of breath.    [provider]  aspirin EC 81 MG tablet Take 81 mg by mouth daily.    [provider]  b complex vitamins capsule Take 1 capsule by mouth every Tuesday, Thursday, and Saturday at 6 PM.    [provider]  Betamethasone Dipropionate 0.05 % EMUL 2 drops 2 (two) times daily. Both ear    [provider]  BLACK CURRANT SEED OIL PO Take by mouth as  needed.    [provider]  carvedilol (COREG) 6.25 MG tablet Take 6.25 mg by mouth 2 (two) times daily with a meal.    [provider]  Cholecalciferol (VITAMIN D-3) 125 MCG (5000 UT) TABS Take 5,000 Units by mouth every Monday, Wednesday, and Friday.    [provider]  Continuous Blood Gluc Receiver (FREESTYLE LIBRE 14 DAY READER) DEVI Inject 1 application into the skin every 14 (fourteen) days.    [provider]  Continuous Blood Gluc Sensor (FREESTYLE LIBRE SENSOR SYSTEM) MISC by Does not apply route.    [provider]  Dulaglutide 1.5 MG/0.5ML SOPN Inject 1.5 mg into the skin once a week. Tuesday    [provider]  ezetimibe (ZETIA) 10 MG tablet Take 10 mg by  mouth daily. In am    [provider]  fluticasone (FLONASE) 50 MCG/ACT nasal spray Place 1 spray into both nostrils daily as needed for allergies or rhinitis.    [provider]  ibuprofen (ADVIL) 600 MG tablet Take 600 mg by mouth daily as needed for moderate pain or mild pain. 11/01/16   [provider]  lansoprazole (PREVACID) 30 MG capsule Take 30 mg by mouth every evening.    [provider]  LINZESS 145 MCG CAPS capsule Take 145 mcg by mouth every morning. 10/03/21   [provider]  LORazepam (ATIVAN) 0.5 MG tablet Take 0.5 mg by mouth every 8 (eight) hours as needed for anxiety.    [provider]  MINERAL OIL PO Place 1 drop into both ears once a week.    [provider]  Misc Natural Products (GLUCOSAMINE CHOND MSM FORMULA) TABS Take 1 tablet by mouth 2 (two) times daily.    [provider]  nystatin-triamcinolone ointment (MYCOLOG) Apply 1 application topically 2 (two) times daily. Both ear    [provider]  ondansetron (ZOFRAN) 4 MG tablet Take 4 mg by mouth every 6 (six) hours as needed for nausea.    [provider]  OREGANO PO Take by mouth.    [provider]  telmisartan-hydrochlorothiazide (MICARDIS HCT) 80-12.5 MG tablet Take 1 tablet by mouth daily.    [provider]  tiZANidine (ZANAFLEX) 2 MG tablet Take 2-4 mg by mouth 3 (three) times daily as needed for muscle spasms. 03/29/21   [provider]  traMADol (ULTRAM) 50 MG tablet Take 50-100 mg by mouth 3 (three) times daily as needed for pain. 02/03/21   [provider]  Turmeric 450 MG CAPS Take 450 mg by mouth daily. Patient not taking: Reported on 08/28/2023    [provider]  UNABLE TO FIND Med Name: organic vin ager    [provider]  verapamil (VERELAN PM) 240 MG 24 hr capsule Take 240 mg by mouth at bedtime. CAN ONLY TAKE ACTAVIS BRAND YELLOW AND BLUE CAPSULE 01/16/21   [provider]    Family History Family History  Problem Relation Age of Onset   Diabetes Mother        DM   Hypertension Father    Diabetes Father        DM   Heart attack Brother    CAD Brother    Heart disease Paternal Grandfather    Diabetes Brother        DM   Heart attack Brother    CAD Brother    Lupus Other        Family H/O of Lupus  Breast cancer Cousin        unsure of age   Dementia Neg Hx    Alzheimer's disease Neg Hx     Social History Social History   Tobacco Use   Smoking status: Never   Smokeless tobacco: Never  Vaping Use   Vaping status: Never Used  Substance Use Topics   Alcohol use: No   Drug use: No     Allergies   Rivaroxaban, Accupril [quinapril hcl], Penicillins, Accupril [quinapril hcl], Cephalosporins, Nexletol [bempedoic acid], Other, Pioglitazone, Rosuvastatin, Zetia [ezetimibe], and Nitroglycerin   Review of Systems Review of Systems  Gastrointestinal:  Positive for abdominal pain.   Per HPI  Physical Exam Triage Vital Signs ED Triage Vitals  Encounter Vitals Group     BP 02/08/24 1250 (!) 148/78     Systolic BP Percentile --      Diastolic BP Percentile --      Pulse Rate 02/08/24 1250 72     Resp 02/08/24 1250 16     Temp 02/08/24 1250 98.1 F (36.7 C)     Temp Source 02/08/24 1250 Oral     SpO2 02/08/24 1250 97 %     Weight 02/08/24 1250 148 lb (67.1 kg)     Height 02/08/24 1250 4\' 11"  (1.499 m)     Head Circumference --      Peak Flow --      Pain Score 02/08/24 1248 7     Pain Loc --      Pain Education --      Exclude from Growth Chart --    No data found.  Updated Vital Signs BP (!) 148/78 (BP Location: Left Arm)   Pulse 72   Temp 98.1 F (36.7 C) (Oral)   Resp 16   Ht 4\' 11"  (1.499 m)   Wt 148 lb (67.1 kg)   SpO2 97%   BMI 29.89 kg/m   Visual Acuity Right Eye Distance:   Left Eye Distance:   Bilateral Distance:    Right Eye Near:   Left Eye Near:    Bilateral Near:     Physical  Exam Vitals and nursing note reviewed.  Constitutional:      General: She is not in acute distress.    Appearance: She is well-developed. She is not ill-appearing.  Cardiovascular:     Rate and Rhythm: Normal rate and regular rhythm.  Pulmonary:     Effort: Pulmonary effort is normal.     Breath sounds: Normal breath sounds.  Abdominal:     General: Abdomen is protuberant. There is no distension.     Tenderness: There is abdominal tenderness in the right lower quadrant. There is guarding. There is no right CVA tenderness, left CVA tenderness or rebound. Positive signs include McBurney's sign. Negative signs include Murphy's sign, Rovsing's sign, psoas sign and obturator sign.     Hernia: No hernia is present.  Neurological:     Mental Status: She is alert.      UC Treatments / Results  Labs (all labs ordered are listed, but only abnormal results are displayed) Labs Reviewed - No data to display  EKG   Radiology No results found.  Procedures Procedures (including critical care time)  Medications Ordered in UC Medications - No data to display  Initial Impression / Assessment and Plan / UC Course  I have reviewed the triage vital signs and the nursing notes.  Pertinent labs & imaging results that were available during my care  of the patient were reviewed by me and considered in my medical decision making (see chart for details).     Patient presented with sharp, shooting right lower abdominal pain that began yesterday. Denies any other symptoms.   Upon assessment tender upon palpation to RLQ with guarding and positive McBurney's sign. Denies rebound tenderness.  Recommended patient be seen in ER for further evaluation and imaging to rule out intraabdominal etiology. Patient agreeable to plan at this time. Patient is stable at this time to arrive to ER via POV.  Final Clinical Impressions(s) / UC Diagnoses   Final diagnoses:  Right lower quadrant abdominal pain      Discharge Instructions      Go to ER for further evaluation.      ED Prescriptions   None    PDMP not reviewed this encounter.   Wynonia Lawman A, NP 02/08/24 1330

## 2024-02-08 NOTE — ED Triage Notes (Signed)
Pt c.o RLQ pain since yesterday, no n/v

## 2024-02-08 NOTE — ED Notes (Signed)
 Patient transported to CT

## 2024-02-08 NOTE — Discharge Instructions (Signed)
 Go to ER for further evaluation .

## 2024-02-08 NOTE — ED Triage Notes (Signed)
Patient here today with c/o right lower abd pain since yesterday.

## 2024-02-08 NOTE — ED Notes (Signed)
Pt placed onto monitor. No needs at this time. Call light in reach.

## 2024-02-08 NOTE — Discharge Instructions (Addendum)
Thank you for coming to [facility] Emergency Department. You were seen for abdominal pain. We did an exam, labs, and imaging, and these showed no acute findings. You do have a small fibroid on the uterus for which you can follow up with your gynecologist. It is possible that constipation is contributing to your symptoms - please take 1-2 capfuls of miralax per day and stay well hydrated at home.  Please follow up with your primary care provider within 1 week.   Do not hesitate to return to the ED or call 911 if you experience: -Worsening symptoms -Urinary symptoms -Blood in the stool -Lightheadedness, passing out -Fevers/chills -Anything else that concerns you

## 2024-02-08 NOTE — ED Notes (Signed)
Patient is being discharged from the Urgent Care and sent to the Emergency Department via self . Per provider, patient is in need of higher level of care due to right lower abd pain. Patient is aware and verbalizes understanding of plan of care.  Vitals:   02/08/24 1250  BP: (!) 148/78  Pulse: 72  Resp: 16  Temp: 98.1 F (36.7 C)  SpO2: 97%

## 2024-02-08 NOTE — ED Provider Triage Note (Signed)
Emergency Medicine Provider Triage Evaluation Note  Jennifer [name] , a 73 y.o. female  was evaluated in triage.  Pt complains of abdominal pain.  Pt sent here from urgent care   Review of Systems  Positive: Right lower abdominal pain  Negative: fever  Physical Exam  BP (!) 142/74 (BP Location: Right Arm)   Pulse 73   Temp 97.7 F (36.5 C)   Resp 18   SpO2 100%  Gen:   Awake, no distress   Resp:  Normal effort  MSK:   Moves extremities without difficulty  Other:    Medical Decision Making  Medically screening exam initiated at 2:05 PM.  Appropriate orders placed.  Jennifer [name] was informed that the remainder of the evaluation will be completed by another provider, this initial triage assessment does not replace that evaluation, and the importance of remaining in the ED until their evaluation is complete.     Elson Areas, [place] 02/08/24 1406

## 2024-02-08 NOTE — ED Provider Notes (Signed)
 [place] [facility] AT [facility] Provider Note   CSN: [id number] Arrival date & time: 02/08/24  1333     History  Chief Complaint  Patient presents with   Abdominal Pain    Jennifer [name] is a 73 y.o. female with PMH as listed below who presents with RLQ abd pain that began yesterday, denies N/V/D/C fevers/chills, urinary sxs, vaginal symptoms, hematochezia or melena.. Patient was sent from [facility] for [facility].  Pain comes and goes and is very severe when it comes.  Nonradiating, feels sharp and stabbing.  Does have a history of kidney stones but that in the past was flank pain, and she has not had any flank pain this time. Also notes her bowel movements yesterday were small and hard.    Past Medical History:  Diagnosis Date   Anxiety    Asthma    Bronchitis    Carpal tunnel syndrome on both sides    left worse than right   Degenerative arthritis of cervical spine    Depressive disorder, not elsewhere classified    Diabetes (HCC)    Family history of heart disease    younger brother died from MI age 63   GERD (gastroesophageal reflux disease)    Headache    migraines   History of asthma    no longer requires med., per pt.   History of kidney stones    Hypercholesteremia    Hyperlipidemia    Hypertension    states under control with meds., has been on med. 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 Authorizing Provider  acetaminophen (TYLENOL) 500 MG tablet Take 1,000 mg by mouth as needed for moderate pain or mild pain.    [provider]  albuterol (VENTOLIN HFA) 108 (90 Base) MCG/ACT inhaler Inhale 1-2 puffs into the lungs every 6 (six) hours as needed for wheezing or shortness of breath.    [provider]  aspirin EC 81 MG tablet Take 81 mg by mouth daily.    [provider]  b complex vitamins capsule Take 1 capsule by mouth every Tuesday, Thursday, and Saturday at 6 PM.    [provider]  Betamethasone Dipropionate 0.05 % EMUL 2 drops 2 (two) times daily. Both ear    [provider]  BLACK CURRANT SEED OIL PO Take by mouth as needed.    [provider]  carvedilol (COREG) 6.25 MG tablet Take 6.25 mg by mouth 2 (two) times daily with a meal.    [provider]  Cholecalciferol (VITAMIN D-3) 125 MCG (5000 UT) TABS Take 5,000 Units by mouth every Monday, Wednesday, and Friday.    [provider]  Continuous Blood Gluc Receiver (FREESTYLE LIBRE 14 DAY READER) DEVI Inject 1 application into the skin every 14 (fourteen) days.    [provider]  Continuous Blood Gluc Sensor (FREESTYLE LIBRE SENSOR SYSTEM) MISC by Does not apply route.    [provider]  Dulaglutide 1.5  MG/0.5ML SOPN Inject 1.5 mg into the skin once a week. Tuesday    [provider]  ezetimibe (ZETIA) 10 MG tablet Take 10 mg by mouth daily. In am    [provider]  fluticasone (FLONASE) 50 MCG/ACT nasal spray Place 1 spray into both nostrils daily as needed for allergies or rhinitis.    [provider]  ibuprofen (ADVIL) 600 MG tablet Take 600 mg by mouth daily as needed for moderate pain or mild pain. 11/01/16   [provider]  lansoprazole (PREVACID) 30 MG capsule Take 30 mg by mouth every evening.    [provider]  LINZESS 145 MCG CAPS capsule Take 145 mcg by mouth every morning. 10/03/21   [provider]  LORazepam (ATIVAN) 0.5 MG tablet Take 0.5 mg by mouth every 8 (eight) hours as needed for anxiety.     [provider]  MINERAL OIL PO Place 1 drop into both ears once a week.    [provider]  Misc Natural Products (GLUCOSAMINE CHOND MSM FORMULA) TABS Take 1 tablet by mouth 2 (two) times daily.    [provider]  nystatin-triamcinolone ointment (MYCOLOG) Apply 1 application topically 2 (two) times daily. Both ear    [provider]  ondansetron (ZOFRAN) 4 MG tablet Take 4 mg by mouth every 6 (six) hours as needed for nausea.    [provider]  OREGANO PO Take by mouth.    [provider]  telmisartan-hydrochlorothiazide (MICARDIS HCT) 80-12.5 MG tablet Take 1 tablet by mouth daily.    [provider]  tiZANidine (ZANAFLEX) 2 MG tablet Take 2-4 mg by mouth 3 (three) times daily as needed for muscle spasms. 03/29/21   [provider]  traMADol (ULTRAM) 50 MG tablet Take 50-100 mg by mouth 3 (three) times daily as needed for pain. 02/03/21   [provider]  Turmeric 450 MG CAPS Take 450 mg by mouth daily. Patient not taking: Reported on 08/28/2023    [provider]  UNABLE TO FIND Med Name: organic vin ager    [provider]  verapamil (VERELAN PM) 240 MG 24 hr capsule Take 240 mg by mouth at bedtime. CAN ONLY TAKE ACTAVIS BRAND YELLOW AND BLUE CAPSULE 01/16/21   [provider]      Allergies    Rivaroxaban, Accupril [quinapril hcl], Penicillins, Accupril [quinapril hcl], Cephalosporins, Nexletol [bempedoic acid], Other, Pioglitazone, Rosuvastatin, Zetia [ezetimibe], and Nitroglycerin    Review of Systems   Review of Systems A 10 point review of systems was performed and is negative unless otherwise reported in HPI.  Physical Exam Updated Vital Signs BP (!) 155/90   Pulse 80   Temp 98.4 F (36.9 C) (Oral)   Resp 18   SpO2 99%  Physical Exam General: Normal appearing female, lying in bed.  HEENT: Sclera anicteric, MMM, trachea midline.  Cardiology: RRR, no murmurs/rubs/gallops.  BL radial and DP pulses equal bilaterally.  Resp: Normal respiratory rate and effort. CTAB, no wheezes, rhonchi, crackles.  Abd: Soft, nontender, non-distended. No rebound tenderness or guarding.  GU: Deferred. MSK: No peripheral edema or signs of trauma. Extremities without deformity or TTP. Skin: warm, dry.  Back: No CVA tenderness Neuro: A&Ox4, CNs II-XII grossly intact. MAEs. Sensation grossly intact.  Psych: Normal mood and affect.   ED Results / Procedures / Treatments   Labs (all labs ordered are listed, but only abnormal results are displayed) Labs Reviewed  COMPREHENSIVE METABOLIC PANEL - Abnormal; Notable  for the following components:      Result Value   Potassium 3.4 (*)    Glucose, Bld 104 (*)    Alkaline Phosphatase 33 (*)    All other components within normal limits  URINALYSIS, ROUTINE W REFLEX MICROSCOPIC - Abnormal; Notable for the following components:   Color, Urine STRAW (*)    All other components within normal limits  LIPASE, BLOOD  CBC    EKG None  Radiology CT abd pelvis w contrast: 1. Small hiatal hernia. 2. Calcified uterine fibroid. 3. No acute abdominal or pelvic pathology identified. 4. Aortic atherosclerosis (ICD10-I70.0).    Procedures Procedures    Medications Ordered in ED Medications  morphine (PF) 4 MG/ML injection 4 mg (4 mg Intravenous Given 02/08/24 1626)  iohexol (OMNIPAQUE) 350 MG/ML injection 75 mL (75 mLs Intravenous Contrast Given 02/08/24 1557)    ED Course/ Medical Decision Making/ A&P                          Medical Decision Making Amount and/or Complexity of Data Reviewed Labs: ordered. Decision-making details documented in ED Course. Radiology: ordered. Decision-making details documented in ED Course.  Risk Prescription drug management.    This patient presents to the ED for concern of RLQ abd pain, this involves an extensive number of treatment options, and is a complaint that carries with it a high risk of  complications and morbidity.  I considered the following differential and admission for this acute, potentially life threatening condition.   MDM:    For DDX for abdominal pain includes but is not limited to:  Abdominal exam without peritoneal signs. No evidence of acute abdomen at this time. Low suspicion for acute hepatobiliary disease (including acute cholecystitis or cholangitis), acute pancreatitis (neg lipase), acute appendicitis, diverticulitis. Also consider ureterolithiasis. No CVA TTP or urinary sxs to indicate pyelonephritis.   Clinical Course as of 02/15/24 1425  Sat Feb 08, 2024  1459 WBC: 7.4 No leukocytosis  [HN]  1550 Comprehensive metabolic panel(!) Unremarkable in the context of this patient's presentation  [HN]  1550 Lipase: 44 neg [HN]  1642 CT ABDOMEN PELVIS W CONTRAST 1. Small hiatal hernia. 2. Calcified uterine fibroid. 3. No acute abdominal or pelvic pathology identified. 4. Aortic atherosclerosis (ICD10-I70.0).   [HN]  1716 Urinalysis, Routine w reflex microscopic -Urine, Clean Catch(!) neg [HN]    Clinical Course User Index [HN] Loetta Rough, MD    Labs: I Ordered, and personally interpreted labs.  The pertinent results include:  those listed above  Imaging Studies ordered: I ordered imaging studies including CT abd pelvis w contrast I independently visualized and interpreted imaging. I agree with the radiologist interpretation  Additional history obtained from chart review.    Reevaluation: After the interventions noted above, I reevaluated the patient and found that they have :resolved  Social Determinants of Health: Lives independently  Disposition:  Patient is informed of her imaging findings. Possible the fibroid could be contributing to her sxs though it is relatively small. Advised follow up with her gynecologist within 1-2 weeks. She also notes that she has been relatively constipated lately, possible that this is also  contributing, advised miralax 1-2 capfuls per day and staying well hydrated at home, f/u with PCP within 1-2 weeks. Considered admission but patient is stable for discharge. DC w/ discharge instructions/return precautions. All questions answered to patient's satisfaction.    Co morbidities that complicate the patient evaluation  Past Medical History:  Diagnosis Date   Anxiety    Asthma    Bronchitis    Carpal tunnel syndrome on both sides    left worse than right   Degenerative arthritis of cervical spine    Depressive disorder, not elsewhere classified    Diabetes (HCC)    Family history of heart disease    younger brother died from MI age 17   GERD (gastroesophageal reflux disease)    Headache    migraines   History of asthma    no longer requires med., per pt.   History of kidney stones    Hypercholesteremia    Hyperlipidemia    Hypertension    states under control with meds., has been on med. 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   Sickle cell trait (HCC)    Stroke [facility])    exact date unknown   Vitamin D deficiency      Medicines Meds ordered this encounter  Medications   morphine (PF) 4 MG/ML injection 4 mg   iohexol (OMNIPAQUE) 350 MG/ML injection 75 mL    I have reviewed the patients home medicines and have made adjustments as needed  Problem List / ED Course: Problem List Items Addressed This Visit   None Visit Diagnoses       Right lower quadrant abdominal pain    -  Primary                   This note was created using dictation software, which may contain spelling or grammatical errors.    Loetta Rough, MD 02/15/24 [phone]

## 2024-04-14 DIAGNOSIS — H35033 Hypertensive retinopathy, bilateral: Secondary | ICD-10-CM | POA: Diagnosis not present

## 2024-04-14 DIAGNOSIS — E119 Type 2 diabetes mellitus without complications: Secondary | ICD-10-CM | POA: Diagnosis not present

## 2024-04-14 DIAGNOSIS — G458 Other transient cerebral ischemic attacks and related syndromes: Secondary | ICD-10-CM | POA: Diagnosis not present

## 2024-04-14 DIAGNOSIS — H25813 Combined forms of age-related cataract, bilateral: Secondary | ICD-10-CM | POA: Diagnosis not present

## 2024-05-05 ENCOUNTER — Ambulatory Visit: Payer: PPO | Admitting: Adult Health

## 2024-05-05 ENCOUNTER — Encounter: Payer: Self-pay | Admitting: Adult Health

## 2024-05-05 VITALS — BP 111/73 | HR 72 | Ht 59.0 in | Wt 144.2 lb

## 2024-05-05 DIAGNOSIS — R413 Other amnesia: Secondary | ICD-10-CM

## 2024-05-05 NOTE — Progress Notes (Signed)
 PATIENT: Jennifer [name] DOB: [birth year]  REASON FOR VISIT: follow up HISTORY FROM: patient  Chief Complaint  Patient presents with   Follow-up    Rm 19, alone.  Feels like memory worsened.      HISTORY OF PRESENT ILLNESS: Today 05/05/24:  Jennifer [name] is a 73 y.o. female with a history of memory disturbace. Returns today for follow-up. Feels like memory has gotten worse.  She feels that it may be related to stress and medication.  She states that she is on lorazepam and she states that that may be affecting her memory as well.  She continues to live at home alone.  She has neighbors that check on her frequently.  She completes all ADLs independently.  Manages her own medications appointments and finances.  She states on the rare occasion she may miss an appointment or medication but that is not often.  She does drive to [place]  frequently to take care of her mother.  Denies any trouble driving.  She denies depression just feels like she is under a lot of stress taking care of her mother.  Currently not on any memory medication.  She also reports that on occasion she gets pain in the back of the head.  She describes it as a pulsating feeling as if her heart is beating in the back of her head.  She feels that this sensation is related to stress.  Reports that she typically will feel it only when she lays down but it is not happening every time she lays down.  She does not wish to have any further workup today.     08/28/23: Jennifer [name] is a 73 y.o. female with a history of memory disturbance. Returns today for follow-up.  She has not been seen since 2022.  She feels that her memory is worse.  She does not knowledge that she does feel little depressed.  Husband passed a while back. Feels like she has PTSD from dealing with her husband's sickness. She lives at home alone.  Able to complete all ADLs independently.  Continues to manage her own medications finances and  appointments. Reports that she starts things but not complete it. Housework is an example- she doesn't want to do it. Trying to get better. Returns today for follow-up.   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  No acute abnormalities are noted.   11/21/21: Jennifer [name] is a 73 year old female with a history of memory disturbance. She returns today for follow-up. Returns today for follow-up.  She continues to live at home alone.  She is able to complete all ADLs independently.  Manages her own medications, finances and appointments.  She states that she recently started back working at [employer] 8 hours a week.  She denies any new symptoms.  Reports that she did start a B complex vitamin and feels that has been beneficial for her memory . she returns today for an evaluation.  05/18/21: Jennifer [name] is a 73 year old female with a history of memory disturbance.  She returns today for follow-up.  She feels that her memory has improved.  She lives at home alone.  She is able to complete all ADLs independently.  She reports that she  has some physical limitations with her housework.  Manages her finances.  She manages her appointments and medications.  She had an MRI that was relatively unremarkable with exception of an old infarct.  Sleep study was unremarkable.  Patient returns today for evaluation.  HISTORY 12/27/20: Jennifer [name] is a 73 y.o. year old Black or [place] American female patient seen here as a referral on 12/27/2020 from Dr. Tresia Fruit.    Chief concern according to patient : "My memory changed when I lost my husband, March 2021, and there was drama with is children for 2-3 years before."       I have the pleasure of seeing Jennifer [name] today, a  right -handed Black or [place] American female with a possible sleep disorder.    Jennifer [name] is a 73 y.o. female here as requested by Jimmey Mould, MD for memory changes. PMHx HTN, DM2, HLD, osteoarthritis, back pain, kidney stones, drug-induced myopathy and myalgia, carpal tunnel syndrome, memory changes, grief reaction. I reviewed Dr. Luanne Runner notes: Patient feels tired, poor sleep, stressed, now on prednisone and her A1c is up to 7.28, no recent lorazepam use, she is worried about her memory she is gotten lost a few times and she is very tired a lot, B12 and thyroid  were checked, vitamin D, lipid panel were also checked, vitamin D was normal 62.6, CMP showed creatinine 0.62 and BUN 11 otherwise unremarkable labs were drawn 2020-09-07, B12 was low at 219 we will recheck. TSH normal.    Patient reports ongoing for 2 years, worsening, patient always has to use her GPS, it was worse several months ago. She is leaving things in places. She has a lot of stress the last few years, forgetting things, simple things, getting worse, she needs a nap every day, husband died this year and there is grief, even cooking she is forgetting, to prepare a big meal is difficult when she cooks for people, she has to write things down, ongoing 2 years, no hx of dementi ain the family that she knows of, her bills are paid, she has to use her GPS, she has to think where she is but not getting lost. No weakness, no sensory changes, husband had dementia and she was his caregiver. No other focal neurologic deficits, associated symptoms, inciting events or modifiable factors.     Sleep relevant medical history: see above. 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    Family medical /sleep history: no other family member on CPAP with OSA.    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  She lives in a household alone, widowed. No  biological children, Pets are not present. Tobacco use: none  ETOH use ; none,  Caffeine intake in form of Coffee( /) Soda( /) Tea ( /) - less than a cup in 3 days.  No regular exercise .           Sleep habits are as follows: The patient's lunch time is between 12-2  PM, and last meal is 5-6 PM.  The patient goes to bed at 6 or at 10 PM, she is actively trying to change that.   and continues to sleep for several hours, wakes for 1-2 bathroom breaks, the first time at 3 AM.     The preferred sleep position is on her left side , with the support of 1 pillow.  Bedroom is cool, quiet  and dark.  Dreams are reportedly rare. 5.30 AM is the usual rise time.  The patient wakes up spontaneously. She reports not feeling refreshed or restored in AM, with symptoms such as dry mouth , but no morning headaches , and residual fatigue.  Naps are taken routinely, lasting from 2-3 Pm and is much more refreshing than nocturnal sleep.     REVIEW OF SYSTEMS: Out of a complete 14 system review of symptoms, the patient complains only of the following symptoms, and all other reviewed systems are negative.  ALLERGIES: Allergies  Allergen Reactions   Rivaroxaban Hives   Accupril [Quinapril Hcl] Other (See Comments) and Cough    NUMBNESS AND TINGLING   Penicillins Hives    Reaction: Childhood   Accupril [Quinapril Hcl]    Cephalosporins Hives and Itching   Nexletol [Bempedoic Acid]    Other Hives   Pioglitazone     Other reaction(s): Numbness   Rosuvastatin     Other reaction(s): Myalgias   Nitroglycerin Rash    PATCH Other reaction(s): Itch    HOME MEDICATIONS: Outpatient Medications Prior to Visit  Medication Sig Dispense Refill   acetaminophen  (TYLENOL ) 500 MG tablet Take 500 mg by mouth every 8 (eight) hours as needed for moderate pain (pain score 4-6) or mild pain (pain score 1-3).     albuterol (VENTOLIN HFA) 108 (90 Base) MCG/ACT inhaler Inhale 1-2 puffs into the lungs every 6 (six) hours  as needed for wheezing or shortness of breath.     aspirin EC 81 MG tablet Take 81 mg by mouth daily.     b complex vitamins capsule Take 1 capsule by mouth every Tuesday, Thursday, and Saturday at 6 PM.     BLACK CURRANT SEED OIL PO Take by mouth as needed.     carvedilol (COREG) 6.25 MG tablet Take 6.25 mg by mouth 2 (two) times daily with a meal.     Cholecalciferol (VITAMIN D-3) 125 MCG (5000 UT) TABS Take 5,000 Units by mouth every Monday, Wednesday, and Friday.     Continuous Blood Gluc Receiver (FREESTYLE LIBRE 14 DAY READER) DEVI Inject 1 application into the skin every 14 (fourteen) days.     Continuous Blood Gluc Sensor (FREESTYLE LIBRE SENSOR SYSTEM) MISC by Does not apply route.     Dulaglutide 1.5 MG/0.5ML SOPN Inject 1.5 mg into the skin once a week. Tuesday     ezetimibe (ZETIA) 10 MG tablet Take 10 mg by mouth daily. In am     fluticasone (FLONASE) 50 MCG/ACT nasal spray Place 1 spray into both nostrils daily as needed for allergies or rhinitis.     lansoprazole (PREVACID) 30 MG capsule Take 30 mg by mouth every evening.     LINZESS 145 MCG CAPS capsule Take 145 mcg by mouth every morning.     LORazepam (ATIVAN) 0.5 MG tablet Take 0.5 mg by mouth every 8 (eight) hours as needed for anxiety.     Misc Natural Products (GLUCOSAMINE CHOND MSM FORMULA) TABS Take 1 tablet by mouth 2 (two) times daily.     OREGANO PO Take by mouth.     polyethylene glycol (MIRALAX / GLYCOLAX) 17 g packet Take 17 g by mouth daily as needed.     telmisartan-hydrochlorothiazide (MICARDIS HCT) 80-12.5 MG tablet Take 1 tablet by mouth daily.     tiZANidine (ZANAFLEX) 2 MG tablet Take 2-4 mg by mouth 3 (three) times daily as needed for muscle spasms.     UNABLE TO FIND Med  Name: organic vin ager     verapamil (VERELAN PM) 240 MG 24 hr capsule Take 240 mg by mouth at bedtime. CAN ONLY TAKE ACTAVIS BRAND YELLOW AND BLUE CAPSULE     Betamethasone  Dipropionate 0.05 % EMUL 2 drops 2 (two) times daily. Both ear      ibuprofen (ADVIL) 600 MG tablet Take 600 mg by mouth daily as needed for moderate pain or mild pain.     MINERAL OIL PO Place 1 drop into both ears once a week.     nystatin-triamcinolone ointment (MYCOLOG) Apply 1 application topically 2 (two) times daily. Both ear     ondansetron  (ZOFRAN ) 4 MG tablet Take 4 mg by mouth every 6 (six) hours as needed for nausea.     traMADol (ULTRAM) 50 MG tablet Take 50-100 mg by mouth 3 (three) times daily as needed for pain.     Turmeric 450 MG CAPS Take 450 mg by mouth daily. (Patient not taking: Reported on 08/28/2023)     No facility-administered medications prior to visit.    PAST MEDICAL HISTORY: Past Medical History:  Diagnosis Date   Anxiety    Asthma    Bronchitis    Carpal tunnel syndrome on both sides    left worse than right   Degenerative arthritis of cervical spine    Depressive disorder, not elsewhere classified    Diabetes (HCC)    Family history of heart disease    younger brother died from MI age 30   GERD (gastroesophageal reflux disease)    Headache    migraines   History of asthma    no longer requires med., per pt.   History of kidney stones    Hypercholesteremia    Hyperlipidemia    Hypertension    states under control with meds., has been on med. 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)    exact date unknown   Vitamin D deficiency     PAST SURGICAL HISTORY: Past Surgical History:  Procedure Laterality Date   BREAST LUMPECTOMY WITH RADIOACTIVE SEED LOCALIZATION Left 01/30/2018   Procedure: LEFT BREAST BRACKETED LUMPECTOMY WITH RADIOACTIVE SEED LOCALIZATION;  Surgeon: Oza Blumenthal, MD;  Location: [facility];  Service: General;   Laterality: Left;   BREAST SURGERY Left 2009   calcification to left breast   CESAREAN SECTION  1982   DILATION AND CURETTAGE OF UTERUS     ENDOMETRIAL ABLATION  1991   TUBAL LIGATION  1982    FAMILY HISTORY: Family History  Problem Relation Age of Onset   Diabetes Mother        DM   Hypertension Father    Diabetes Father        DM   Heart attack Brother    CAD Brother    Heart disease Paternal Grandfather    Diabetes Brother        DM   Heart attack Brother    CAD Brother    Lupus Other        Family H/O of Lupus   Breast cancer Cousin        unsure of age   Dementia Neg Hx  Alzheimer's disease Neg Hx     SOCIAL HISTORY: Social History   Socioeconomic History   Marital status: Widowed    Spouse name: Not on file   Number of children: 2   Years of education: 12   Highest education level: Bachelor's degree (e.g., BA, AB, BS)  Occupational History   Occupation: Part-Time at [employer]  Tobacco Use   Smoking status: Never   Smokeless tobacco: Never  Vaping Use   Vaping status: Never Used  Substance and Sexual Activity   Alcohol use: No   Drug use: No   Sexual activity: Not Currently  Other Topics Concern   Not on file  Social History Narrative   Retired recently as a Engineer, civil (consulting)   Right handed   Caffeine: decaf coffee occasionally    Lives at home alone      Patient has 2 biological adult children, Darnell and Cornelia, and 5 grandchildren    Social Drivers of Corporate investment banker Strain: Low Risk  (09/21/2020)   Overall Financial Resource Strain (CARDIA)    Difficulty of Paying Living Expenses: Not very hard  Food Insecurity: Low Risk  (07/02/2023)   Received from [facility]   Hunger Vital Sign    Worried About Running Out of Food in the Last Year: Never true    Ran Out of Food in the Last Year: Never true  Transportation Needs: Not on file (07/02/2023)  Physical Activity: Inactive (09/21/2020)   Exercise Vital Sign    Days of Exercise per Week: 0  days    Minutes of Exercise per Session: 0 min  Stress: No Stress Concern Present (09/21/2020)   [employer] of Occupational Health - Occupational Stress Questionnaire    Feeling of Stress : Only a little  Social Connections: Moderately Isolated (09/21/2020)   Social Connection and Isolation Panel [NHANES]    Frequency of Communication with Friends and Family: More than three times a week    Frequency of Social Gatherings with Friends and Family: More than three times a week    Attends Religious Services: More than 4 times per year    Active Member of [employer] or Organizations: No    Attends Banker Meetings: Never    Marital Status: Widowed  Intimate Partner Violence: Not At Risk (09/21/2020)   Humiliation, Afraid, Rape, and Kick questionnaire    Fear of Current or Ex-Partner: No    Emotionally Abused: No    Physically Abused: No    Sexually Abused: No      PHYSICAL EXAM  Vitals:   05/05/24 1043  BP: 111/73  Pulse: 72  Weight: 144 lb 3.2 oz (65.4 kg)  Height: 4\' 11"  (1.499 m)     Body mass index is 29.12 kg/m.      05/05/2024   10:56 AM 08/28/2023   11:48 AM 11/21/2021    3:07 PM  MMSE - Mini Mental State Exam  Orientation to time 5 5 5   Orientation to Place 5 5 5   Registration 3 3 3   Attention/ Calculation 1 1 1   Recall 3 3 3   Language- name 2 objects 2 2 2   Language- repeat 1 0 1  Language- follow 3 step command 3 3 3   Language- read & follow direction 1 1 1   Write a sentence 1 1 1   Copy design 1 1 1   Total score 26 25 26      Generalized: Well developed, in no acute distress   Neurological examination  Mentation:  Alert oriented to time, place, history taking. Follows all commands speech and language fluent Cranial nerve II-XII: Pupils were equal round reactive to light. Extraocular movements were full, visual field were full on confrontational test. Facial sensation and strength were normal. Uvula tongue midline. Head turning and shoulder  shrug  were normal and symmetric. Motor: The motor testing reveals 5 over 5 strength of all 4 extremities. Good symmetric motor tone is noted throughout.  Sensory: Sensory testing is intact to soft touch on all 4 extremities. No evidence of extinction is noted.  Coordination: Cerebellar testing reveals good finger-nose-finger and heel-to-shin bilaterally.  Gait and station: Gait is normal.  Reflexes: Deep tendon reflexes are symmetric and normal bilaterally.   DIAGNOSTIC DATA (LABS, IMAGING, TESTING) - I reviewed patient records, labs, notes, testing and imaging myself where available.  Lab Results  Component Value Date   WBC 7.4 02/08/2024   HGB 13.6 02/08/2024   HCT 39.3 02/08/2024   MCV 86.4 02/08/2024   PLT 255 02/08/2024      Component Value Date/Time   NA 139 02/08/2024 1347   NA 143 11/16/2020 0842   K 3.4 (L) 02/08/2024 1347   CL 101 02/08/2024 1347   CO2 26 02/08/2024 1347   GLUCOSE 104 (H) 02/08/2024 1347   BUN 8 02/08/2024 1347   BUN 8 11/16/2020 0842   CREATININE 0.67 02/08/2024 1347   CREATININE 0.80 01/17/2015 1146   CALCIUM 9.6 02/08/2024 1347   PROT 6.9 02/08/2024 1347   ALBUMIN 4.1 02/08/2024 1347   AST 20 02/08/2024 1347   ALT 24 02/08/2024 1347   ALKPHOS 33 (L) 02/08/2024 1347   BILITOT 0.8 02/08/2024 1347   GFRNONAA >60 02/08/2024 1347   GFRAA 95 11/16/2020 0842      ASSESSMENT AND PLAN 73 y.o. year old female  has a past medical history of Anxiety, Asthma, Bronchitis, Carpal tunnel syndrome on both sides, Degenerative arthritis of cervical spine, Depressive disorder, not elsewhere classified, Diabetes (HCC), Family history of heart disease, GERD (gastroesophageal reflux disease), Headache, History of asthma, History of kidney stones, Hypercholesteremia, Hyperlipidemia, Hypertension, Immature cataract of both eyes, Memory difficulty, Non-insulin dependent type 2 diabetes mellitus (HCC), PAT (paroxysmal atrial tachycardia) (HCC) (1970's), Pneumonia,  Sclerosing adenosis of breast, left (12/2017), Sickle cell trait (HCC), Stroke (HCC), and Vitamin D deficiency. here with :  1.  Memory disturbance  -- MMSE 26/30 previously 25/30 -- Memory score has remained stable.  For now we will continue to monitor. --Also reports some discomfort in the back of the head.  Advised that we can do imaging however she deferred for now.  She feels that her symptoms are related to stress.  I advised that if her symptoms worsen or she develops new symptoms she should let us  know or seek care at the nearest urgent care or ED. --Patient preferred to follow-up in 1 year she will call us  sooner if needed   Clem Currier, MSN, NP-C 05/05/2024, 11:23 AM [facility] [address] [place], [place] [address] [phone]

## 2024-05-05 NOTE — Patient Instructions (Signed)
 Your Plan:  Continue to monitor symptoms If the pain in the back of your head worsens please let me know or go to the local urgent care or ED.     Thank you for coming to see us  at [facility]. I hope we have been able to provide you high quality care today.  You may receive a patient satisfaction survey over the next few weeks. We would appreciate your feedback and comments so that we may continue to improve ourselves and the health of our patients.

## 2024-06-24 DIAGNOSIS — E1169 Type 2 diabetes mellitus with other specified complication: Secondary | ICD-10-CM | POA: Diagnosis not present

## 2024-06-24 DIAGNOSIS — I10 Essential (primary) hypertension: Secondary | ICD-10-CM | POA: Diagnosis not present

## 2024-06-24 DIAGNOSIS — E7800 Pure hypercholesterolemia, unspecified: Secondary | ICD-10-CM | POA: Diagnosis not present

## 2024-06-24 DIAGNOSIS — M542 Cervicalgia: Secondary | ICD-10-CM | POA: Diagnosis not present

## 2024-06-24 DIAGNOSIS — E559 Vitamin D deficiency, unspecified: Secondary | ICD-10-CM | POA: Diagnosis not present

## 2024-06-24 DIAGNOSIS — I700 Atherosclerosis of aorta: Secondary | ICD-10-CM | POA: Diagnosis not present

## 2024-08-06 DIAGNOSIS — K5730 Diverticulosis of large intestine without perforation or abscess without bleeding: Secondary | ICD-10-CM | POA: Diagnosis not present

## 2024-08-06 DIAGNOSIS — D124 Benign neoplasm of descending colon: Secondary | ICD-10-CM | POA: Diagnosis not present

## 2024-08-06 DIAGNOSIS — Z1211 Encounter for screening for malignant neoplasm of colon: Secondary | ICD-10-CM | POA: Diagnosis not present

## 2024-08-10 DIAGNOSIS — D124 Benign neoplasm of descending colon: Secondary | ICD-10-CM | POA: Diagnosis not present

## 2024-08-11 DIAGNOSIS — E559 Vitamin D deficiency, unspecified: Secondary | ICD-10-CM | POA: Diagnosis not present

## 2024-08-11 DIAGNOSIS — I10 Essential (primary) hypertension: Secondary | ICD-10-CM | POA: Diagnosis not present

## 2024-08-11 DIAGNOSIS — E1169 Type 2 diabetes mellitus with other specified complication: Secondary | ICD-10-CM | POA: Diagnosis not present

## 2024-08-11 DIAGNOSIS — E7800 Pure hypercholesterolemia, unspecified: Secondary | ICD-10-CM | POA: Diagnosis not present

## 2024-08-18 DIAGNOSIS — E1169 Type 2 diabetes mellitus with other specified complication: Secondary | ICD-10-CM | POA: Diagnosis not present

## 2024-08-18 DIAGNOSIS — K219 Gastro-esophageal reflux disease without esophagitis: Secondary | ICD-10-CM | POA: Diagnosis not present

## 2024-08-18 DIAGNOSIS — G5600 Carpal tunnel syndrome, unspecified upper limb: Secondary | ICD-10-CM | POA: Diagnosis not present

## 2024-08-18 DIAGNOSIS — F4321 Adjustment disorder with depressed mood: Secondary | ICD-10-CM | POA: Diagnosis not present

## 2024-08-18 DIAGNOSIS — M6289 Other specified disorders of muscle: Secondary | ICD-10-CM | POA: Diagnosis not present

## 2024-08-18 DIAGNOSIS — J4520 Mild intermittent asthma, uncomplicated: Secondary | ICD-10-CM | POA: Diagnosis not present

## 2024-08-18 DIAGNOSIS — E559 Vitamin D deficiency, unspecified: Secondary | ICD-10-CM | POA: Diagnosis not present

## 2024-08-18 DIAGNOSIS — I10 Essential (primary) hypertension: Secondary | ICD-10-CM | POA: Diagnosis not present

## 2024-08-18 DIAGNOSIS — Z23 Encounter for immunization: Secondary | ICD-10-CM | POA: Diagnosis not present

## 2024-08-18 DIAGNOSIS — E7800 Pure hypercholesterolemia, unspecified: Secondary | ICD-10-CM | POA: Diagnosis not present

## 2024-08-18 DIAGNOSIS — Z0000 Encounter for general adult medical examination without abnormal findings: Secondary | ICD-10-CM | POA: Diagnosis not present

## 2024-08-18 DIAGNOSIS — Z6829 Body mass index (BMI) 29.0-29.9, adult: Secondary | ICD-10-CM | POA: Diagnosis not present

## 2024-08-20 ENCOUNTER — Encounter: Payer: Self-pay | Admitting: Internal Medicine

## 2024-08-20 ENCOUNTER — Ambulatory Visit: Attending: Cardiology | Admitting: Internal Medicine

## 2024-08-20 VITALS — BP 122/66 | HR 84 | Ht 59.0 in | Wt 148.5 lb

## 2024-08-20 DIAGNOSIS — Z8673 Personal history of transient ischemic attack (TIA), and cerebral infarction without residual deficits: Secondary | ICD-10-CM | POA: Diagnosis not present

## 2024-08-20 DIAGNOSIS — E785 Hyperlipidemia, unspecified: Secondary | ICD-10-CM

## 2024-08-20 DIAGNOSIS — Z8679 Personal history of other diseases of the circulatory system: Secondary | ICD-10-CM

## 2024-08-20 DIAGNOSIS — I2510 Atherosclerotic heart disease of native coronary artery without angina pectoris: Secondary | ICD-10-CM

## 2024-08-20 NOTE — Progress Notes (Signed)
 Cardiology Office Note   Date:  08/20/2024   ID:  Jennifer, [name] 04-04-1951, MRN [id number]  PCP:  Okey Carlin Redbird, MD  Cardiologist:   Vina Okey, MD   Pt referred for eval of atherosclerosis     History of Present Illness: Jennifer [name] is a 73 y.o. female who is referred for atherosclerosis.   CT of abdomen in Feb 2025 showed atherosclerosis of aorta.    This also showed coronary calcifications   Pt says she feels good   SHe does not walk regularly       During day she makes visits on computer    DOes light housework   Pt denies CP    Has some indigestion.     PT had stroke in 1990s   2018 got weak on other side     Diet Breakfast:   1/2 bagel with rasins and cinnamon  Fresh stewed veggies Egg   Zero Lunch:   Salad / Academic librarian   Skips or cracker Honey drinks         Current Meds  Medication Sig   acetaminophen  (TYLENOL ) 500 MG tablet Take 500 mg by mouth every 8 (eight) hours as needed for moderate pain (pain score 4-6) or mild pain (pain score 1-3).   albuterol (VENTOLIN HFA) 108 (90 Base) MCG/ACT inhaler Inhale 1-2 puffs into the lungs every 6 (six) hours as needed for wheezing or shortness of breath.   aspirin EC 81 MG tablet Take 81 mg by mouth daily.   b complex vitamins capsule Take 1 capsule by mouth every Tuesday, Thursday, and Saturday at 6 PM.   BLACK CURRANT SEED OIL PO Take by mouth as needed.   carvedilol (COREG) 6.25 MG tablet Take 6.25 mg by mouth 2 (two) times daily with a meal.   Cholecalciferol (VITAMIN D-3) 125 MCG (5000 UT) TABS Take 5,000 Units by mouth every Monday, Wednesday, and Friday.   Continuous Blood Gluc Receiver (FREESTYLE LIBRE 14 DAY READER) DEVI Inject 1 application into the skin every 14 (fourteen) days.   Continuous Blood Gluc Sensor (FREESTYLE LIBRE SENSOR SYSTEM) MISC by Does not apply route.   Dulaglutide 1.5 MG/0.5ML SOPN Inject 1.5 mg into the skin once a week. Tuesday   ezetimibe (ZETIA) 10  MG tablet Take 10 mg by mouth daily. In am   fluticasone (FLONASE) 50 MCG/ACT nasal spray Place 1 spray into both nostrils daily as needed for allergies or rhinitis.   lansoprazole (PREVACID) 30 MG capsule Take 30 mg by mouth every evening.   LINZESS 145 MCG CAPS capsule Take 145 mcg by mouth every morning.   LORazepam (ATIVAN) 0.5 MG tablet Take 0.5 mg by mouth every 8 (eight) hours as needed for anxiety.   Misc Natural Products (GLUCOSAMINE CHOND MSM FORMULA) TABS Take 1 tablet by mouth 2 (two) times daily.   OREGANO PO Take by mouth.   polyethylene glycol (MIRALAX / GLYCOLAX) 17 g packet Take 17 g by mouth daily as needed.   telmisartan-hydrochlorothiazide (MICARDIS HCT) 80-12.5 MG tablet Take 1 tablet by mouth daily.   tiZANidine (ZANAFLEX) 2 MG tablet Take 2-4 mg by mouth 3 (three) times daily as needed for muscle spasms.   UNABLE TO FIND Med Name: organic vin ager   verapamil (VERELAN PM) 240 MG 24 hr capsule Take 240 mg by mouth at bedtime. CAN ONLY TAKE ACTAVIS BRAND YELLOW AND BLUE CAPSULE     Allergies:  Rivaroxaban, Accupril [quinapril hcl], Penicillins, Accupril [quinapril hcl], Cephalosporins, Nexletol [bempedoic acid], Other, Pioglitazone, Rosuvastatin, and Nitroglycerin   Past Medical History:  Diagnosis Date   Anxiety    Asthma    Bronchitis    Carpal tunnel syndrome on both sides    left worse than right   Degenerative arthritis of cervical spine    Depressive disorder, not elsewhere classified    Diabetes (HCC)    Family history of heart disease    younger brother died from MI age 62   GERD (gastroesophageal reflux disease)    Headache    migraines   History of asthma    no longer requires med., per pt.   History of kidney stones    Hypercholesteremia    Hyperlipidemia    Hypertension    states under control with meds., has been on med. 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   Sickle cell trait (HCC)    Stroke [facility])    exact date unknown   Vitamin D deficiency     Past Surgical History:  Procedure Laterality Date   BREAST LUMPECTOMY WITH RADIOACTIVE SEED LOCALIZATION Left 01/30/2018   Procedure: LEFT BREAST BRACKETED LUMPECTOMY WITH RADIOACTIVE SEED LOCALIZATION;  Surgeon: Vernetta Berg, MD;  Location: [facility];  Service: General;  Laterality: Left;   BREAST SURGERY Left 2009   calcification to left breast   CESAREAN SECTION  1982   DILATION AND CURETTAGE OF UTERUS     ENDOMETRIAL ABLATION  1991   TUBAL LIGATION  1982     Social History:  The patient  reports that she has never smoked. She has never used smokeless tobacco. She reports that she does not drink alcohol and does not use drugs.   Family History:  The patient's family history includes Breast cancer in her cousin; CAD in her brother and brother; Diabetes in her brother, father, and mother; Heart attack in her brother and brother; Heart disease in her paternal grandfather; Hypertension in her father; Lupus in an other family member.    ROS:  Please see the history of present illness. All other systems are reviewed and  Negative to the above problem except as noted.    PHYSICAL EXAM: VS:  BP 122/66   Pulse 84   Ht 4' 11 (1.499 m)   Wt 148 lb 8 oz (67.4 kg)   SpO2 98%   BMI 29.99 kg/m   GEN: Well nourished, well developed, in no acute distress  HEENT: normal  Neck: no JVD, carotid bruits Cardiac: RRR; no murmurs, Respiratory:  clear to auscultation  GI: soft, nontender, No hepatomegaly  MS: no deformity Moving all extremities   Ext  No LE edema   EKG:  EKG is ordered today.  NSR 82 bpm  FIrst degree AV block  PR 202 msec    Lipid Panel No results found for: CHOL, TRIG, HDL, CHOLHDL, VLDL, LDLCALC, LDLDIRECT     Wt Readings from Last 3 Encounters:  08/20/24 148 lb 8 oz (67.4 kg)  05/05/24 144 lb 3.2 oz (65.4 kg)  02/08/24 148 lb (67.1 kg)      ASSESSMENT AND PLAN:  1 CAD   I have reviewed CT of abdomen done in Feb  2025 which showed calcifications of LAD Pt denies CP   But she is not too active     WOUld set up for Lexiscan  PET/CT Continue ASA     2  Hx of CVA   With this hx will set up for carotid USN  3  Lipids  St up for NMR panel, Lpa and Apo B  4  HTN BP is controlled    Keep on current regimen       Current medicines are reviewed at length with the patient today.  The patient does not have concerns regarding medicines.  Signed, Vina Gull, MD  08/20/2024 8:32 AM    [facility] [address] [place], [place], [place]  [address] Phone: [phone]; Fax: [phone]

## 2024-08-20 NOTE — Patient Instructions (Addendum)
 Medication Instructions:  Your physician recommends that you continue on your current medications as directed. Please refer to the Current Medication list given to you today.  *If you need a refill on your cardiac medications before your next appointment, please call your pharmacy*  Lab Work: TODAY: NMR, LP(a), Apo B If you have labs (blood work) drawn today and your tests are completely normal, you will receive your results only by: MyChart Message (if you have MyChart) OR A paper copy in the mail If you have any lab test that is abnormal or we need to change your treatment, we will call you to review the results.  Testing/Procedures: Your physician has requested you have a cardiac PET stress CT scan performed. These are completed at [facility]. A scheduler will call you to schedule an appointment to have this test completed.  Your physician has requested that you have a carotid duplex. This test is an ultrasound of the carotid arteries in your neck. It looks at blood flow through these arteries that supply the brain with blood. Allow one hour for this exam. There are no restrictions or special instructions.  Follow-Up: At [facility], you and your health needs are our priority.  As part of our continuing mission to provide you with exceptional heart care, our providers are all part of one team.  This team includes your primary Cardiologist (physician) and Advanced Practice Providers or APPs (Physician Assistants and Nurse Practitioners) who all work together to provide you with the care you need, when you need it.  Your next appointment:   9 month(s)  Provider:   Vina Gull, MD   We recommend signing up for the patient portal called MyChart.  Sign up information is provided on this After Visit Summary.  MyChart is used to connect with patients for Virtual Visits (Telemedicine).  Patients are able to view lab/test results, encounter notes, upcoming appointments, etc.   Non-urgent messages can be sent to your provider as well.    To learn more about what you can do with MyChart, go to [email or web address].

## 2024-08-21 ENCOUNTER — Encounter (HOSPITAL_COMMUNITY): Payer: Self-pay

## 2024-08-21 LAB — NMR, LIPOPROFILE
Cholesterol, Total: 147 mg/dL (ref 100–199)
HDL Particle Number: 32 umol/L (ref >=30.5)
HDL-C: 48 mg/dL (ref >39)
LDL Particle Number: 999 nmol/L (ref <1000)
LDL Size: 20.6 nm (ref >20.5)
LDL-C (NIH Calc): 78 mg/dL (ref 0–99)
LP-IR Score: 61 — ABNORMAL HIGH (ref <=45)
Small LDL Particle Number: 539 nmol/L — ABNORMAL HIGH (ref <=527)
Triglycerides: 115 mg/dL (ref 0–149)

## 2024-08-21 LAB — APOLIPOPROTEIN B: Apolipoprotein B: 63 mg/dL (ref <90)

## 2024-08-21 LAB — LIPOPROTEIN A (LPA): Lipoprotein (a): 11.9 nmol/L (ref <75.0)

## 2024-08-22 ENCOUNTER — Ambulatory Visit: Payer: Self-pay | Admitting: Internal Medicine

## 2024-08-22 DIAGNOSIS — E785 Hyperlipidemia, unspecified: Secondary | ICD-10-CM

## 2024-08-22 DIAGNOSIS — Z79899 Other long term (current) drug therapy: Secondary | ICD-10-CM

## 2024-08-25 ENCOUNTER — Telehealth (HOSPITAL_COMMUNITY): Payer: Self-pay | Admitting: Emergency Medicine

## 2024-08-25 NOTE — Telephone Encounter (Signed)
 Reaching out to patient to offer assistance regarding upcoming cardiac imaging study; pt verbalizes understanding of appt date/time, parking situation and where to check in, pre-test NPO status and medications ordered, and verified current allergies; name and call back number provided for further questions should they arise Rockwell Alexandria RN Navigator Cardiac Imaging [facility] [phone] office [phone] cell

## 2024-08-26 ENCOUNTER — Encounter (HOSPITAL_COMMUNITY)
Admission: RE | Admit: 2024-08-26 | Discharge: 2024-08-26 | Disposition: A | Discharge status: Home / Self Care | Source: Ambulatory Visit | Attending: Internal Medicine | Admitting: Internal Medicine

## 2024-08-26 DIAGNOSIS — E785 Hyperlipidemia, unspecified: Secondary | ICD-10-CM | POA: Diagnosis not present

## 2024-08-26 DIAGNOSIS — I700 Atherosclerosis of aorta: Secondary | ICD-10-CM | POA: Insufficient documentation

## 2024-08-26 DIAGNOSIS — I2510 Atherosclerotic heart disease of native coronary artery without angina pectoris: Secondary | ICD-10-CM | POA: Diagnosis not present

## 2024-08-26 LAB — NM PET CT CARDIAC PERFUSION MULTI W/ABSOLUTE BLOODFLOW
LV dias vol: 53 mL (ref 46–106)
LV sys vol: 15 mL (ref 3.8–5.2)
MBFR: 1.91
Nuc Rest EF: 72 %
Nuc Stress EF: 80 %
Peak HR: 96 {beats}/min
Rest HR: 69 {beats}/min
Rest MBF: 1.17 ml/g/min
Rest Nuclear Isotope Dose: 17.6 mCi
ST Depression (mm): 0 mm
Stress MBF: 2.23 ml/g/min
Stress Nuclear Isotope Dose: 17.3 mCi

## 2024-08-26 MED ORDER — RUBIDIUM RB82 GENERATOR (RUBYFILL)
17.2600 | PACK | Freq: Once | INTRAVENOUS | Status: AC
  Administered 2024-08-26: 17.26 via INTRAVENOUS

## 2024-08-26 MED ORDER — RUBIDIUM RB82 GENERATOR (RUBYFILL)
17.5900 | PACK | Freq: Once | INTRAVENOUS | Status: AC
  Administered 2024-08-26: 17.59 via INTRAVENOUS

## 2024-08-26 MED ORDER — REGADENOSON 0.4 MG/5ML IV SOLN
INTRAVENOUS | Status: AC
  Filled 2024-08-26: qty 5

## 2024-08-26 MED ORDER — REGADENOSON 0.4 MG/5ML IV SOLN
0.4000 mg | Freq: Once | INTRAVENOUS | Status: AC
  Administered 2024-08-26: 0.4 mg via INTRAVENOUS

## 2024-08-26 MED ORDER — ATORVASTATIN CALCIUM 10 MG PO TABS: 10.0000 mg | ORAL_TABLET | Freq: Every day | ORAL | 3 refills | Status: AC

## 2024-09-01 ENCOUNTER — Ambulatory Visit (HOSPITAL_COMMUNITY)
Admission: RE | Admit: 2024-09-01 | Discharge: 2024-09-01 | Disposition: A | Discharge status: Home / Self Care | Source: Ambulatory Visit | Attending: Internal Medicine | Admitting: Internal Medicine

## 2024-09-01 DIAGNOSIS — Z8673 Personal history of transient ischemic attack (TIA), and cerebral infarction without residual deficits: Secondary | ICD-10-CM | POA: Diagnosis not present

## 2024-09-28 DIAGNOSIS — M6281 Muscle weakness (generalized): Secondary | ICD-10-CM | POA: Diagnosis not present

## 2024-10-05 DIAGNOSIS — M6281 Muscle weakness (generalized): Secondary | ICD-10-CM | POA: Diagnosis not present

## 2024-10-08 DIAGNOSIS — M6281 Muscle weakness (generalized): Secondary | ICD-10-CM | POA: Diagnosis not present

## 2024-10-12 DIAGNOSIS — M6281 Muscle weakness (generalized): Secondary | ICD-10-CM | POA: Diagnosis not present

## 2024-10-15 DIAGNOSIS — M6281 Muscle weakness (generalized): Secondary | ICD-10-CM | POA: Diagnosis not present

## 2024-10-22 DIAGNOSIS — M6281 Muscle weakness (generalized): Secondary | ICD-10-CM | POA: Diagnosis not present

## 2024-10-26 DIAGNOSIS — M6281 Muscle weakness (generalized): Secondary | ICD-10-CM | POA: Diagnosis not present

## 2024-11-10 DIAGNOSIS — H25813 Combined forms of age-related cataract, bilateral: Secondary | ICD-10-CM | POA: Diagnosis not present

## 2024-11-10 DIAGNOSIS — E119 Type 2 diabetes mellitus without complications: Secondary | ICD-10-CM | POA: Diagnosis not present

## 2024-11-10 DIAGNOSIS — G458 Other transient cerebral ischemic attacks and related syndromes: Secondary | ICD-10-CM | POA: Diagnosis not present

## 2024-11-10 DIAGNOSIS — H35033 Hypertensive retinopathy, bilateral: Secondary | ICD-10-CM | POA: Diagnosis not present

## 2025-05-11 ENCOUNTER — Ambulatory Visit: Admitting: Adult Health
